# Patient Record
Sex: Female | Born: 1945 | Race: White | Hispanic: No | Marital: Married | Smoking: Never smoker
Health system: Southern US, Community
[De-identification: ages and names within clinical notes are randomized; demographics above are authoritative.]

## PROBLEM LIST (undated history)

## (undated) DIAGNOSIS — Z973 Presence of spectacles and contact lenses: Secondary | ICD-10-CM

## (undated) DIAGNOSIS — E039 Hypothyroidism, unspecified: Secondary | ICD-10-CM

## (undated) DIAGNOSIS — E785 Hyperlipidemia, unspecified: Secondary | ICD-10-CM

## (undated) DIAGNOSIS — M791 Myalgia, unspecified site: Secondary | ICD-10-CM

## (undated) DIAGNOSIS — N200 Calculus of kidney: Secondary | ICD-10-CM

## (undated) DIAGNOSIS — Z9889 Other specified postprocedural states: Secondary | ICD-10-CM

## (undated) DIAGNOSIS — S2691XA Contusion of heart, unspecified with or without hemopericardium, initial encounter: Secondary | ICD-10-CM

## (undated) DIAGNOSIS — R748 Abnormal levels of other serum enzymes: Secondary | ICD-10-CM

## (undated) DIAGNOSIS — H269 Unspecified cataract: Secondary | ICD-10-CM

## (undated) DIAGNOSIS — K5733 Diverticulitis of large intestine without perforation or abscess with bleeding: Secondary | ICD-10-CM

## (undated) DIAGNOSIS — Z853 Personal history of malignant neoplasm of breast: Secondary | ICD-10-CM

## (undated) DIAGNOSIS — J45909 Unspecified asthma, uncomplicated: Secondary | ICD-10-CM

## (undated) DIAGNOSIS — R112 Nausea with vomiting, unspecified: Secondary | ICD-10-CM

## (undated) DIAGNOSIS — K5730 Diverticulosis of large intestine without perforation or abscess without bleeding: Secondary | ICD-10-CM

## (undated) DIAGNOSIS — Z8619 Personal history of other infectious and parasitic diseases: Secondary | ICD-10-CM

## (undated) DIAGNOSIS — Z8719 Personal history of other diseases of the digestive system: Secondary | ICD-10-CM

## (undated) DIAGNOSIS — Z8709 Personal history of other diseases of the respiratory system: Secondary | ICD-10-CM

## (undated) DIAGNOSIS — I2510 Atherosclerotic heart disease of native coronary artery without angina pectoris: Secondary | ICD-10-CM

## (undated) DIAGNOSIS — C801 Malignant (primary) neoplasm, unspecified: Secondary | ICD-10-CM

## (undated) DIAGNOSIS — Z974 Presence of external hearing-aid: Secondary | ICD-10-CM

## (undated) DIAGNOSIS — N189 Chronic kidney disease, unspecified: Secondary | ICD-10-CM

## (undated) DIAGNOSIS — Z87442 Personal history of urinary calculi: Secondary | ICD-10-CM

## (undated) DIAGNOSIS — I219 Acute myocardial infarction, unspecified: Secondary | ICD-10-CM

## (undated) DIAGNOSIS — M8580 Other specified disorders of bone density and structure, unspecified site: Secondary | ICD-10-CM

## (undated) DIAGNOSIS — J302 Other seasonal allergic rhinitis: Secondary | ICD-10-CM

## (undated) DIAGNOSIS — I951 Orthostatic hypotension: Secondary | ICD-10-CM

## (undated) HISTORY — DX: Unspecified cataract: H26.9

## (undated) HISTORY — DX: Other specified disorders of bone density and structure, unspecified site: M85.80

## (undated) HISTORY — DX: Unspecified asthma, uncomplicated: J45.909

## (undated) HISTORY — PX: BREAST SURGERY: SHX581

## (undated) HISTORY — PX: COLON SURGERY: SHX602

## (undated) HISTORY — DX: Diverticulitis of large intestine without perforation or abscess with bleeding: K57.33

## (undated) HISTORY — PX: CARDIAC CATHETERIZATION: SHX172

## (undated) HISTORY — PX: CATARACT EXTRACTION: SUR2

## (undated) HISTORY — PX: COLONOSCOPY: SHX174

## (undated) HISTORY — PX: TONSILLECTOMY: SUR1361

## (undated) HISTORY — DX: Malignant (primary) neoplasm, unspecified: C80.1

## (undated) HISTORY — PX: CARDIOVASCULAR STRESS TEST: SHX262

## (undated) HISTORY — DX: Acute myocardial infarction, unspecified: I21.9

## (undated) HISTORY — DX: Calculus of kidney: N20.0

## (undated) HISTORY — PX: APPENDECTOMY: SHX54

## (undated) HISTORY — PX: LUMBAR FUSION: SHX111

## (undated) HISTORY — PX: OTHER SURGICAL HISTORY: SHX169

---
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
## 1987-01-25 HISTORY — PX: ARTHROSCOPIC REPAIR ACL: SUR80

## 1997-01-24 HISTORY — PX: OTHER SURGICAL HISTORY: SHX169

## 1997-05-28 ENCOUNTER — Encounter: Admission: RE | Admit: 1997-05-28 | Discharge: 1997-08-26 | Payer: Self-pay | Admitting: Radiation Oncology

## 1997-08-07 ENCOUNTER — Other Ambulatory Visit: Admission: RE | Admit: 1997-08-07 | Discharge: 1997-08-07 | Payer: Self-pay | Admitting: Hematology and Oncology

## 1998-01-09 ENCOUNTER — Other Ambulatory Visit: Admission: RE | Admit: 1998-01-09 | Discharge: 1998-01-09 | Payer: Self-pay | Admitting: *Deleted

## 1998-10-01 ENCOUNTER — Other Ambulatory Visit: Admission: RE | Admit: 1998-10-01 | Discharge: 1998-10-01 | Payer: Self-pay | Admitting: General Surgery

## 1999-01-06 ENCOUNTER — Ambulatory Visit (HOSPITAL_COMMUNITY): Admission: RE | Admit: 1999-01-06 | Discharge: 1999-01-06 | Payer: Self-pay | Admitting: Internal Medicine

## 2000-01-25 HISTORY — PX: MASTECTOMY: SHX3

## 2000-03-10 ENCOUNTER — Other Ambulatory Visit: Admission: RE | Admit: 2000-03-10 | Discharge: 2000-03-10 | Payer: Self-pay | Admitting: *Deleted

## 2000-04-20 ENCOUNTER — Encounter (INDEPENDENT_AMBULATORY_CARE_PROVIDER_SITE_OTHER): Payer: Self-pay | Admitting: Specialist

## 2000-04-20 ENCOUNTER — Observation Stay (HOSPITAL_COMMUNITY): Admission: RE | Admit: 2000-04-20 | Discharge: 2000-04-21 | Payer: Self-pay | Admitting: Surgery

## 2001-05-02 ENCOUNTER — Other Ambulatory Visit: Admission: RE | Admit: 2001-05-02 | Discharge: 2001-05-02 | Payer: Self-pay | Admitting: *Deleted

## 2001-08-22 ENCOUNTER — Ambulatory Visit: Admission: RE | Admit: 2001-08-22 | Discharge: 2001-08-22 | Payer: Self-pay | Admitting: Gynecology

## 2002-05-20 ENCOUNTER — Other Ambulatory Visit: Admission: RE | Admit: 2002-05-20 | Discharge: 2002-05-20 | Payer: Self-pay | Admitting: *Deleted

## 2002-12-25 ENCOUNTER — Ambulatory Visit: Admission: RE | Admit: 2002-12-25 | Discharge: 2002-12-25 | Payer: Self-pay | Admitting: Gynecology

## 2003-01-07 ENCOUNTER — Ambulatory Visit (HOSPITAL_COMMUNITY): Admission: RE | Admit: 2003-01-07 | Discharge: 2003-01-07 | Payer: Self-pay | Admitting: Gynecology

## 2003-01-07 ENCOUNTER — Encounter (INDEPENDENT_AMBULATORY_CARE_PROVIDER_SITE_OTHER): Payer: Self-pay

## 2003-01-07 HISTORY — PX: LAPAROSCOPIC BILATERAL SALPINGO OOPHERECTOMY: SHX5890

## 2003-05-06 ENCOUNTER — Observation Stay (HOSPITAL_COMMUNITY): Admission: EM | Admit: 2003-05-06 | Discharge: 2003-05-07 | Payer: Self-pay | Admitting: Emergency Medicine

## 2003-06-27 ENCOUNTER — Other Ambulatory Visit: Admission: RE | Admit: 2003-06-27 | Discharge: 2003-06-27 | Payer: Self-pay | Admitting: *Deleted

## 2004-05-24 ENCOUNTER — Ambulatory Visit: Payer: Self-pay | Admitting: Oncology

## 2004-10-01 ENCOUNTER — Other Ambulatory Visit: Admission: RE | Admit: 2004-10-01 | Discharge: 2004-10-01 | Payer: Self-pay | Admitting: *Deleted

## 2005-01-26 ENCOUNTER — Encounter (INDEPENDENT_AMBULATORY_CARE_PROVIDER_SITE_OTHER): Payer: Self-pay | Admitting: Specialist

## 2005-01-26 ENCOUNTER — Encounter: Admission: RE | Admit: 2005-01-26 | Discharge: 2005-01-26 | Payer: Self-pay | Admitting: Surgery

## 2005-06-16 ENCOUNTER — Ambulatory Visit: Payer: Self-pay | Admitting: Oncology

## 2005-07-29 ENCOUNTER — Ambulatory Visit: Payer: Self-pay | Admitting: Oncology

## 2006-08-03 ENCOUNTER — Ambulatory Visit: Payer: Self-pay | Admitting: Oncology

## 2006-08-25 HISTORY — PX: OTHER SURGICAL HISTORY: SHX169

## 2007-06-05 ENCOUNTER — Ambulatory Visit: Payer: Self-pay | Admitting: Oncology

## 2007-08-13 ENCOUNTER — Ambulatory Visit: Payer: Self-pay | Admitting: Oncology

## 2008-02-12 ENCOUNTER — Ambulatory Visit: Payer: Self-pay | Admitting: Oncology

## 2008-11-19 ENCOUNTER — Encounter (INDEPENDENT_AMBULATORY_CARE_PROVIDER_SITE_OTHER): Payer: Self-pay | Admitting: *Deleted

## 2008-12-30 ENCOUNTER — Encounter: Admission: RE | Admit: 2008-12-30 | Discharge: 2008-12-30 | Payer: Self-pay | Admitting: Endocrinology

## 2009-05-25 ENCOUNTER — Telehealth: Payer: Self-pay | Admitting: Internal Medicine

## 2009-08-21 ENCOUNTER — Encounter (INDEPENDENT_AMBULATORY_CARE_PROVIDER_SITE_OTHER): Payer: Self-pay | Admitting: Surgery

## 2009-08-21 ENCOUNTER — Inpatient Hospital Stay (HOSPITAL_COMMUNITY): Admission: EM | Admit: 2009-08-21 | Discharge: 2009-08-28 | Payer: Self-pay | Admitting: Emergency Medicine

## 2009-08-21 HISTORY — PX: OTHER SURGICAL HISTORY: SHX169

## 2009-11-24 ENCOUNTER — Encounter (INDEPENDENT_AMBULATORY_CARE_PROVIDER_SITE_OTHER): Payer: Self-pay | Admitting: Surgery

## 2009-11-24 ENCOUNTER — Inpatient Hospital Stay (HOSPITAL_COMMUNITY): Admission: RE | Admit: 2009-11-24 | Discharge: 2009-11-28 | Payer: Self-pay | Admitting: Surgery

## 2009-11-24 HISTORY — PX: COLOSTOMY TAKEDOWN: SHX5783

## 2010-02-23 NOTE — Letter (Signed)
Summary: Colonoscopy Letter  [facility]  [address] [place], [place] [address]   Phone: [phone]  Fax: [phone]      November 19, 2008 MRN: [id number]   Crystal Lloyd [address] [place], [place]  [address]   Dear Ms. Layton,   According to your medical record, it is time for you to schedule a Colonoscopy. The American Cancer Society recommends this procedure as a method to detect early colon cancer. Patients with a family history of colon cancer, or a personal history of colon polyps or inflammatory bowel disease are at increased risk.  This letter has beeen generated based on the recommendations made at the time of your procedure. If you feel that in your particular situation this may no longer apply, please contact our office.  Please call our office at [phone] to schedule this appointment or to update your records at your earliest convenience.  Thank you for cooperating with [place] to provide you with the very best care possible.   Sincerely,  Hedwig Morton. Juanda Chance, M.D.  [facility] [phone]

## 2010-02-23 NOTE — Progress Notes (Signed)
Summary: Schedule Colonoscopy  Phone Note Outgoing Call Call back at [facility] Phone [phone]   Call placed by: Harlow Mares CMA Duncan Dull),  May 25, 2009 2:56 PM Call placed to: Patient Summary of Call: Left message on patients machine to call back. patient is due for a colonoscopy due to pos family history of colon cancer. Initial call taken by: Harlow Mares CMA Duncan Dull),  May 25, 2009 2:57 PM  Follow-up for Phone Call        Left message on patients machine to call back. patient due for colonoscopy we will mail her a letter to remind her. Follow-up by: Harlow Mares CMA Duncan Dull),  Jun 05, 2009 12:03 PM

## 2010-04-07 LAB — URINALYSIS, ROUTINE W REFLEX MICROSCOPIC
Bilirubin Urine: NEGATIVE
Glucose, UA: NEGATIVE mg/dL
Hgb urine dipstick: NEGATIVE
Ketones, ur: NEGATIVE mg/dL
Nitrite: NEGATIVE
Protein, ur: NEGATIVE mg/dL
Specific Gravity, Urine: 1.02 (ref 1.005–1.030)
Urobilinogen, UA: 1 mg/dL (ref 0.0–1.0)
pH: 7 (ref 5.0–8.0)

## 2010-04-07 LAB — DIFFERENTIAL
Basophils Absolute: 0 10*3/uL (ref 0.0–0.1)
Basophils Relative: 1 % (ref 0–1)
Eosinophils Absolute: 0.1 10*3/uL (ref 0.0–0.7)
Eosinophils Relative: 3 % (ref 0–5)
Lymphocytes Relative: 29 % (ref 12–46)
Lymphs Abs: 1.4 10*3/uL (ref 0.7–4.0)
Monocytes Absolute: 0.5 10*3/uL (ref 0.1–1.0)
Monocytes Relative: 11 % (ref 3–12)
Neutro Abs: 2.8 10*3/uL (ref 1.7–7.7)
Neutrophils Relative %: 57 % (ref 43–77)

## 2010-04-07 LAB — PROTIME-INR
INR: 1.01 (ref 0.00–1.49)
Prothrombin Time: 13.5 seconds (ref 11.6–15.2)

## 2010-04-07 LAB — CBC
HCT: 40.3 % (ref 36.0–46.0)
Hemoglobin: 13.8 g/dL (ref 12.0–15.0)
MCH: 29.9 pg (ref 26.0–34.0)
MCHC: 34.3 g/dL (ref 30.0–36.0)
MCV: 87.2 fL (ref 78.0–100.0)
Platelets: 220 10*3/uL (ref 150–400)
RBC: 4.63 MIL/uL (ref 3.87–5.11)
RDW: 13.6 % (ref 11.5–15.5)
WBC: 4.9 10*3/uL (ref 4.0–10.5)

## 2010-04-07 LAB — BASIC METABOLIC PANEL
BUN: 12 mg/dL (ref 6–23)
CO2: 28 mEq/L (ref 19–32)
Calcium: 9.8 mg/dL (ref 8.4–10.5)
Chloride: 106 mEq/L (ref 96–112)
Creatinine, Ser: 0.72 mg/dL (ref 0.4–1.2)
GFR calc Af Amer: >60 mL/min (ref >60)
GFR calc non Af Amer: >60 mL/min (ref >60)
Glucose, Bld: 95 mg/dL (ref 70–99)
Potassium: 3.8 mEq/L (ref 3.5–5.1)
Sodium: 141 mEq/L (ref 135–145)

## 2010-04-07 LAB — SURGICAL PCR SCREEN
MRSA, PCR: NEGATIVE
Staphylococcus aureus: NEGATIVE

## 2010-04-09 LAB — URINALYSIS, MICROSCOPIC ONLY
Bilirubin Urine: NEGATIVE
Glucose, UA: NEGATIVE mg/dL
Hgb urine dipstick: NEGATIVE
Ketones, ur: NEGATIVE mg/dL
Leukocytes, UA: NEGATIVE
Nitrite: NEGATIVE
Protein, ur: NEGATIVE mg/dL
Specific Gravity, Urine: 1.009 (ref 1.005–1.030)
Urobilinogen, UA: 1 mg/dL (ref 0.0–1.0)
pH: 7.5 (ref 5.0–8.0)

## 2010-04-10 LAB — BASIC METABOLIC PANEL
BUN: 5 mg/dL — ABNORMAL LOW (ref 6–23)
BUN: 6 mg/dL (ref 6–23)
CO2: 25 mEq/L (ref 19–32)
CO2: 28 mEq/L (ref 19–32)
Calcium: 7.6 mg/dL — ABNORMAL LOW (ref 8.4–10.5)
Calcium: 8.4 mg/dL (ref 8.4–10.5)
Chloride: 104 mEq/L (ref 96–112)
Chloride: 107 mEq/L (ref 96–112)
Creatinine, Ser: 0.7 mg/dL (ref 0.4–1.2)
Creatinine, Ser: 0.71 mg/dL (ref 0.4–1.2)
GFR calc Af Amer: >60 mL/min (ref >60)
GFR calc Af Amer: >60 mL/min (ref >60)
GFR calc non Af Amer: >60 mL/min (ref >60)
GFR calc non Af Amer: >60 mL/min (ref >60)
Glucose, Bld: 139 mg/dL — ABNORMAL HIGH (ref 70–99)
Glucose, Bld: 181 mg/dL — ABNORMAL HIGH (ref 70–99)
Potassium: 3.8 mEq/L (ref 3.5–5.1)
Potassium: 4.1 mEq/L (ref 3.5–5.1)
Sodium: 138 mEq/L (ref 135–145)
Sodium: 140 mEq/L (ref 135–145)

## 2010-04-10 LAB — POCT I-STAT, CHEM 8
BUN: 13 mg/dL (ref 6–23)
Calcium, Ion: 1.2 mmol/L (ref 1.12–1.32)
Chloride: 105 mEq/L (ref 96–112)
Creatinine, Ser: 0.9 mg/dL (ref 0.4–1.2)
Glucose, Bld: 126 mg/dL — ABNORMAL HIGH (ref 70–99)
HCT: 43 % (ref 36.0–46.0)
Hemoglobin: 14.6 g/dL (ref 12.0–15.0)
Potassium: 3.7 mEq/L (ref 3.5–5.1)
Sodium: 140 mEq/L (ref 135–145)
TCO2: 28 mmol/L (ref 0–100)

## 2010-04-10 LAB — URINE MICROSCOPIC-ADD ON

## 2010-04-10 LAB — COMPREHENSIVE METABOLIC PANEL
ALT: 32 U/L (ref 0–35)
AST: 29 U/L (ref 0–37)
Albumin: 4.3 g/dL (ref 3.5–5.2)
Alkaline Phosphatase: 49 U/L (ref 39–117)
BUN: 13 mg/dL (ref 6–23)
CO2: 27 mEq/L (ref 19–32)
Calcium: 9.7 mg/dL (ref 8.4–10.5)
Chloride: 107 mEq/L (ref 96–112)
Creatinine, Ser: 0.69 mg/dL (ref 0.4–1.2)
GFR calc Af Amer: >60 mL/min (ref >60)
GFR calc non Af Amer: >60 mL/min (ref >60)
Glucose, Bld: 129 mg/dL — ABNORMAL HIGH (ref 70–99)
Potassium: 3.6 mEq/L (ref 3.5–5.1)
Sodium: 140 mEq/L (ref 135–145)
Total Bilirubin: 0.9 mg/dL (ref 0.3–1.2)
Total Protein: 7 g/dL (ref 6.0–8.3)

## 2010-04-10 LAB — URINALYSIS, ROUTINE W REFLEX MICROSCOPIC
Bilirubin Urine: NEGATIVE
Glucose, UA: NEGATIVE mg/dL
Ketones, ur: NEGATIVE mg/dL
Leukocytes, UA: NEGATIVE
Nitrite: NEGATIVE
Protein, ur: NEGATIVE mg/dL
Specific Gravity, Urine: 1.015 (ref 1.005–1.030)
Urobilinogen, UA: 0.2 mg/dL (ref 0.0–1.0)
pH: 5.5 (ref 5.0–8.0)

## 2010-04-10 LAB — POCT CARDIAC MARKERS
CKMB, poc: 5.9 ng/mL (ref 1.0–8.0)
Myoglobin, poc: 84.6 ng/mL (ref 12–200)
Troponin i, poc: <0.05 ng/mL (ref 0.00–0.09)

## 2010-04-10 LAB — URINE CULTURE
Colony Count: NO GROWTH
Culture: NO GROWTH

## 2010-04-10 LAB — DIFFERENTIAL
Basophils Absolute: 0 10*3/uL (ref 0.0–0.1)
Basophils Relative: 0 % (ref 0–1)
Eosinophils Absolute: 0.1 10*3/uL (ref 0.0–0.7)
Eosinophils Relative: 2 % (ref 0–5)
Lymphocytes Relative: 26 % (ref 12–46)
Lymphs Abs: 2.2 10*3/uL (ref 0.7–4.0)
Monocytes Absolute: 0.6 10*3/uL (ref 0.1–1.0)
Monocytes Relative: 8 % (ref 3–12)
Neutro Abs: 5.4 10*3/uL (ref 1.7–7.7)
Neutrophils Relative %: 65 % (ref 43–77)

## 2010-04-10 LAB — LACTIC ACID, PLASMA: Lactic Acid, Venous: 2.8 mmol/L — ABNORMAL HIGH (ref 0.5–2.2)

## 2010-04-10 LAB — CBC
HCT: 35.9 % — ABNORMAL LOW (ref 36.0–46.0)
HCT: 39.7 % (ref 36.0–46.0)
Hemoglobin: 12.2 g/dL (ref 12.0–15.0)
Hemoglobin: 13.6 g/dL (ref 12.0–15.0)
MCH: 30.1 pg (ref 26.0–34.0)
MCH: 30.3 pg (ref 26.0–34.0)
MCHC: 34 g/dL (ref 30.0–36.0)
MCHC: 34.4 g/dL (ref 30.0–36.0)
MCV: 88.3 fL (ref 78.0–100.0)
MCV: 88.3 fL (ref 78.0–100.0)
Platelets: 189 10*3/uL (ref 150–400)
Platelets: 229 10*3/uL (ref 150–400)
RBC: 4.06 MIL/uL (ref 3.87–5.11)
RBC: 4.5 MIL/uL (ref 3.87–5.11)
RDW: 13.2 % (ref 11.5–15.5)
RDW: 13.3 % (ref 11.5–15.5)
WBC: 7.9 10*3/uL (ref 4.0–10.5)
WBC: 8.4 10*3/uL (ref 4.0–10.5)

## 2010-04-10 LAB — LIPASE, BLOOD: Lipase: 33 U/L (ref 11–59)

## 2010-05-07 ENCOUNTER — Ambulatory Visit
Admission: RE | Admit: 2010-05-07 | Discharge: 2010-05-07 | Disposition: A | Discharge status: Home / Self Care | Payer: BLUE CROSS/BLUE SHIELD | Source: Ambulatory Visit | Attending: Surgery | Admitting: Surgery

## 2010-05-07 ENCOUNTER — Other Ambulatory Visit: Payer: Self-pay | Admitting: Surgery

## 2010-05-07 DIAGNOSIS — R52 Pain, unspecified: Secondary | ICD-10-CM

## 2010-05-07 MED ORDER — IOHEXOL 300 MG/ML  SOLN
100.0000 mL | Freq: Once | INTRAMUSCULAR | Status: AC | PRN
  Administered 2010-05-07: 100 mL via INTRAVENOUS

## 2010-06-11 NOTE — Discharge Summary (Signed)
NAMESHERIAN, VALENZA                     ACCOUNT NO.:  [id number]   MEDICAL RECORD NO.:  [id number]                   PATIENT TYPE:  INP   LOCATION:  2028                                 FACILITY:  [facility]   PHYSICIAN:  Maple Mirza, P.A.              DATE OF BIRTH:  Apr 02, 1945   DATE OF ADMISSION:  05/06/2003  DATE OF DISCHARGE:  05/07/2003                                 DISCHARGE SUMMARY   DISCHARGE DIAGNOSES:  1. Admitted with substernal chest pain which traveled through to the back     associated with nausea and mild diaphoresis.  2. Troponin I study negative.  3. Electrocardiogram on admission nondiagnostic.  4. Left heart catheterization May 06, 2003, negative for coronary artery     disease.  The coronaries are normal.  Left ventricular ejection fraction     normal.  Dr.  Sharee Holster, interventionalist.   SECONDARY DIAGNOSES:  1. Recent stress Cardiolite study, February 03, 2003, done to follow up on     electrocardiogram abnormalities.  The impression was a probable negative     stress Cardiolite study revealing good exercise tolerance and a     borderline ischemic electrocardiogram response with absence of angina.  2. History of breast cancer, status post chemotherapy and radiation therapy,     status post bilateral mastectomies.  3. Dyslipidemia with increased triglycerides.  4. History of scarlet fever.  5. History of chickenpox.  6. Status post appendectomy.  7. Status post tonsillectomy.  8. Status post oophorectomy.  9. Status post laparoscopic cholecystectomy.   PROCEDURE:  On May 06, 2003, left heart catheterization study shows the  coronary arteries are free of disease.  They are normal.  The left  ventricular ejection fraction is normal.  Dr.  Randa Evens is the  interventionalist.   DISCHARGE DISPOSITION:  Ms.  Brynda Rim is ready for discharge.  Post-procedure  day number one, after undergoing left heart catheterization, her  catheterization  site is benign.  She has good perfusion to the right lower  extremity.  She has not had any persistent chest pain this hospitalization.  Her Vytorin has been adjusted, and she has been started on Lopressor but  this has been discontinued secondary to finding of normal coronary artery  anatomy.   DISCHARGE MEDICATIONS:  Patient will go home on the following medications:  1. Enteric-coated aspirin 81 mg daily.  2. Vytorin 10/40 daily at bedtime.  Patient believes that she currently     takes 10/20.  I will write her a prescription for 10/40, as Dr.  Corinda Gubler     has recommended.  3. Synthroid 75 mcg daily.  4. Femara 2.5 mg daily.  5. Nitroglycerin 0.4 mg 1 tab under the tongue every five minutes times     three doses as needed for chest pain.   PAIN MANAGEMENT:  To include Tylenol 325 mg 1-2 tabs q. 4-6h as needed.   ACTIVITY:  As  far as activity goes, she is asked not to engage in any heavy  lifting or straining for one week.  She was asked not to drive until Friday,  April 15.   DISCHARGE DIET:  A low-sodium, low-cholesterol diet.   Ms. Kuhlmann may shower.  She is to call about her heart care if she  experiences any swelling or increased pain at the catheterization site.  She  has an office visit with Dr.  Tenny Craw on Thursday, May 29, 2003 at 11:15 in the  morning.  She is asked to call  [facility] in the morning of  Wednesday, May 4 for blood work __________BMEQ, which will be reviewed  during the office visit with Dr.  Tenny Craw.   BRIEF HISTORY AND PHYSICAL:  Ms.  Rake is a 65 year old female with  recurrent substernal chest pain which radiates to the back and is associated  with nausea and mild diaphoresis.  The patient was experiencing symptoms of  this chest discomfort at the time of her office visit on April 12 at 1:30,  and they seem to have started about 20-30 minutes prior to the visit.  She  took two adult aspirin with some relief.  She had two prior episodes.   Patient has had two prior episodes of similar pain about three months and  one month ago.  She was seen by Dr.  Dietrich Pates on December 17 and underwent  subsequent exercise Cardiolite study in early January, as dictated above.  The study showed no concerning evidence of ischemia on Cardiolite study.   HOSPITAL COURSE:  Ms.  Pitz was admitted to the telemetry unit.  She was  placed on IV heparin, IV nitroglycerin, given aspirin and scheduled for left  heart catheterization.  The study, as dictated above, showed that the  coronaries were free of disease, and the patient has done well in the post-  procedure 24 hours.  She has had no cardiac dysrhythmias, no respiratory  compromise.  Her catheterization site is healing nicely.  She has had no  persistent chest pain since her hospitalization, and she goes home with the  medications as dictated above.   LABORATORY STUDIES:  On April 13, complete blood count:  White cells 4.5,  hemoglobin 12.4, hematocrit 36.2 and platelets 213.  Serum electrolytes on  April 13:  Sodium 142, potassium 3.6, chloride 110, carbonate 27, glucose  105, BUN 14, creatinine 0.8.  Her Troponin study I on April 12 at 1458 hours  was less than 0.01.  Her lipid profile April 12:  Cholesterol 195,  triglyceride 197, HDL cholesterol 58, LDL cholesterol 98.  And her liver  function studies on admission April 12:  Alkaline phosphatase 58, SGOT 30,  SGPT 25.  The PT and INR on admission were 13 and INR 1.0.                                                Maple Mirza, P.A.    GM/MEDQ  D:  05/07/2003  T:  05/07/2003  Job:  [id number]   cc:   Pricilla Riffle, M.D.   [name] Mater. Evlyn Kanner, M.D.  [address]  [place]  [place] [address]  Fax: [phone]

## 2010-06-11 NOTE — Cardiovascular Report (Signed)
Crystal Lloyd, Crystal Lloyd                     ACCOUNT NO.:  [id number]   MEDICAL RECORD NO.:  [id number]                   PATIENT TYPE:  INP   LOCATION:  2028                                 FACILITY:  [facility]   PHYSICIAN:  Salvadore Farber, M.D. [facility]         DATE OF BIRTH:  April 29, 1945   DATE OF PROCEDURE:  DATE OF DISCHARGE:  05/07/2003                              CARDIAC CATHETERIZATION   PROCEDURE:  Left-heart catheterization, left ventriculography, coronary  angiography.   INDICATIONS:  Crystal Lloyd is a 65 year old lady without prior history of  cardiovascular disease.  She has had negative exercise testing December of  2004 after suffering chest discomfort.  Over the past several weeks, she has  had multiple episodes of chest discomfort occuring at rest.  The worse of  these was today.  She is referred for cardiac catheterization to exclude  coronary disease as the etiology for her chest discomfort.   PROCEDURAL TECHNIQUE:  Informed consent was obtained.  Under 1% lidocaine  local anesthesia, a 5-French sheath was placed in the right common femoral  artery using the modified Seldinger technique.  Diagnostic angiography and  ventriculography were performed using JL-4, JR-4 and pigtail catheters.  The  patient tolerated the procedure well.  Sheaths were removed, and complete  hemostasis was obtained.  She was then transferred to the holding room in  stable condition.   COMPLICATIONS:  None.   FINDINGS:  1. LV:  129/8/15.  Ejection fraction 70% without regional wall-motion     abnormality.  No aortic stenosis or mitral regurgitation.  2. LEFT MAIN:  Angiographically normal.  3. LAD:  A moderate-sized vessel giving rise to two small diagonals.  It is     angiographically normal.  4. CIRCUMFLEX:  A moderate-sized vessel giving rise to two obtuse marginals.     It is angiographically normal.  5. RCA:  A large, dominant vessel.  It is angiographically normal.   IMPRESSION/RECOMMENDATIONS:  1. Angiographically-normal coronary arteries.  Suspect noncardiac etiology     to her chest discomfort.  2. Normal left ventricular size and systolic function.  3. No aortic stenosis or mitral regurgitation.                                               Salvadore Farber, M.D. [facility]    WED/MEDQ  D:  05/06/2003  T:  05/07/2003  Job:  [id number]   cc:   Cecil Cranker, M.D.

## 2010-06-11 NOTE — Op Note (Signed)
NAME:  Crystal Lloyd, Crystal Lloyd                     ACCOUNT NO.:  [id number]   MEDICAL RECORD NO.:  [id number]                   PATIENT TYPE:  AMB   LOCATION:  DAY                                  FACILITY:  [facility]   PHYSICIAN:  De Blanch, M.D.         DATE OF BIRTH:  1945-11-11   DATE OF PROCEDURE:  01/07/2003  DATE OF DISCHARGE:                                 OPERATIVE REPORT   PREOPERATIVE DIAGNOSIS:  Personal history of breast cancer with high-risk  features.   POSTOPERATIVE DIAGNOSIS:  Personal history of breast cancer with high-risk  features, desiring prophylactic salpingo-oophorectomy to reduce her risk of  ovarian cancer and to improve her breast cancer prognosis.   PROCEDURE:  Laparoscopic bilateral salpingo-oophorectomy.   SURGEON:  De Blanch, M.D.   ASSISTANT:  Telford Nab, R.N.   ANESTHESIA:  General with orotracheal tube.   ESTIMATED BLOOD LOSS:  20 mL.   SURGICAL FINDINGS:  At the time of laparoscopy the uterus, tubes, and  ovaries appeared normal.  The exploration of the peritoneal cavity showed no  abnormalities.  The omentum, stomach, diaphragms, and liver capsule also  appeared normal.   DESCRIPTION OF PROCEDURE:  The patient was brought to the operating room and  after satisfactory attainment of general anesthesia was placed in a modified  lithotomy position in Hollins stirrups and the anterior abdominal wall,  perineum, and vagina were prepped with Betadine.  A Foley catheter was  inserted and a Hulka tenaculum was placed in the uterus.  Open laparoscopic  technique was performed with an incision in the umbilicus, carried down  through the peritoneum.  The peritoneum and fascia were secured with figure-  of-eight sutures of 0 Vicryl.  The Hasson cannula was placed and tied in  place.  The laparoscope was inserted, confirming intraperitoneal placement.  Insufflation with carbon dioxide was then accomplished.  The upper abdomen  and  then the pelvis were explored with the above-noted findings.  A 14 mm  suprapubic cannula was placed under direct visualization.  Peritoneal  washings were obtained from the pelvis.  Two 5 mm ports were placed lateral  to the inferior epigastric vessels, which were easily visualized in this  slender patient.  Using monopolar cautery, the peritoneum overlying the  pelvic sidewall was incised.  A few adhesions from the sigmoid colon to near  the ovary were lysed in order to get better exposure of the ovarian vessels.  The retroperitoneal space was opened and the ureter identified along with  the external iliac vessels.  The infundibulopelvic ligament and ovarian  vessels were then skeletonized and a stapler inserted through the suprapubic  site.  The linear stapler was then fired, dividing the ovarian vessels.  The  peritoneum beneath the fallopian tube was then incised using monopolar  cautery until it was mobilized to the uterus.  Using a second stapler, the  uterine-ovarian anastomosis and fallopian tube were then divided and at the  level of  the uterine cornu with care to bring the stapler as close to the  uterus as possible in order to resect all ovarian tissue.  A similar  procedure was performed on the right side of the pelvis.  All pedicles were  reinspected and found to be hemostatic.  A gauze sponge was then placed in  the pelvis through the suprapubic port and used to absorb any extra blood  and fluid.  All pedicles were again evaluated with the intraperitoneal  pressure dropped down to 8 mmHg.  Hemostasis appeared to be excellent.  The  sponge was removed.  The suprapubic port site was then closed using an Endo-  Stitch needle using 0 Vicryl to close the fascia and peritoneum in the  suprapubic site in order to prevent a hernia.  Likewise, the umbilical site  was closed after the 5 mm ports were removed under direct visualization.  Using 0 Vicryl, the umbilical port site fascia and  peritoneum were closed.  The remainder of the incisions were closed with 3-0 Vicryl in a subcuticular  fashion.  Steri-Strips were applied.  The patient was awakened from  anesthesia.  The Foley catheter and Hulka tenaculum were removed before  completion of anesthesia.  The patient was transferred to the recovery room  in satisfactory condition.  Sponge, needle, and instrument counts correct  x2.                                               De Blanch, M.D.    DC/MEDQ  D:  01/07/2003  T:  01/07/2003  Job:  [id number]   cc:   Valentino Hue. Magrinat, M.D.  [address] [facility]  [place]  [place] [address]  Fax: [phone]   Andres Ege, M.D.  [address]  [place]  [place] [address]  Fax: [phone]   Telford Nab, R.N.  [phone] [address]  [place], [place] [address]

## 2010-06-11 NOTE — Consult Note (Signed)
NAME:  Crystal Lloyd, Crystal Lloyd                     ACCOUNT NO.:  [id number]   MEDICAL RECORD NO.:  [id number]                   PATIENT TYPE:  OUT   LOCATION:  GYN                                  FACILITY:  [facility]   PHYSICIAN:  De Blanch, M.D.         DATE OF BIRTH:  07-13-1945   DATE OF CONSULTATION:  12/25/2002  DATE OF DISCHARGE:                                   CONSULTATION   [age]-old white female seen in consultation at the request of Dr.  Marikay Alar Magrinat and for preoperative discussion, anticipating a prophylactic  bilateral salpingo-oophorectomy.   I initially saw the patient in July of 2003.  Please see my detailed note  from that visit.  Subsequently, the patient has considered at great length  the pros and cons of prophylactic oophorectomy and has consulted with Dr.  Darnelle Catalan as well.  The only change in her medical management at the present  time is that she is no longer taking Evista but is taking Femara.  It is  agreed that the patient will not have a hysterectomy.  She is no longer  using Estring.   The details regarding prophylactic oophorectomy were discussed at length  with the patient and her husband.  We plan a laparoscopic approach.  The  patient is aware of the risks and benefits of laparoscopic surgery.  She  requests that if the surgery cannot be performed laparoscopically due to  technical difficulties or anatomic variation that we proceed under the same  anesthetic with laparotomy.  We also discussed the contingency that should  an ovarian or fallopian tube or primary peritoneal cancer be identified at  the time of surgery we would proceed with complete surgical resection and  staging at that same time.  The patient is aware of the risks of surgery  including hemorrhage, infection, injury to adjacent viscera, thromboembolic  complications, potential for hernias at the laparoscopic trocar sites.  All  of her questions are answered and surgery  is scheduled for January 14, 2003.                                               De Blanch, M.D.    DC/MEDQ  D:  12/25/2002  T:  12/25/2002  Job:  [id number]   cc:   Valentino Hue. Magrinat, M.D.  [address] [facility]  [place]  [place] [address]  Fax: [phone]   Andres Ege, M.D.  [address]  [place]  [place] [address]  Fax: [phone]   Telford Nab, R.N.  [phone] [address]. [address]  [place], [place] [address]

## 2010-07-13 ENCOUNTER — Other Ambulatory Visit: Payer: Self-pay | Admitting: Plastic Surgery

## 2011-05-31 ENCOUNTER — Ambulatory Visit: Payer: BLUE CROSS/BLUE SHIELD | Admitting: Internal Medicine

## 2011-05-31 VITALS — BP 138/79 | HR 92 | Temp 98.1°F | Resp 18 | Ht 59.75 in | Wt 121.8 lb

## 2011-05-31 DIAGNOSIS — J329 Chronic sinusitis, unspecified: Secondary | ICD-10-CM

## 2011-05-31 DIAGNOSIS — Z853 Personal history of malignant neoplasm of breast: Secondary | ICD-10-CM | POA: Insufficient documentation

## 2011-05-31 DIAGNOSIS — E785 Hyperlipidemia, unspecified: Secondary | ICD-10-CM | POA: Insufficient documentation

## 2011-05-31 DIAGNOSIS — J019 Acute sinusitis, unspecified: Secondary | ICD-10-CM

## 2011-05-31 DIAGNOSIS — H9190 Unspecified hearing loss, unspecified ear: Secondary | ICD-10-CM | POA: Insufficient documentation

## 2011-05-31 DIAGNOSIS — E039 Hypothyroidism, unspecified: Secondary | ICD-10-CM | POA: Insufficient documentation

## 2011-05-31 MED ORDER — AMOXICILLIN 875 MG PO TABS: 875.0000 mg | ORAL_TABLET | Freq: Two times a day (BID) | ORAL | Status: AC

## 2011-05-31 MED ORDER — BENZONATATE 100 MG PO CAPS: 100.0000 mg | ORAL_CAPSULE | Freq: Three times a day (TID) | ORAL | Status: AC | PRN

## 2011-05-31 NOTE — Progress Notes (Signed)
  Subjective:    Patient ID: Crystal Lloyd, female    DOB: May 02, 1945, 66 y.o.   MRN: [id number]  HPI10 days ago during jury duty she was exposed to an upper respiratory infection which she caught. As this was resolving 5 days ago she began with nocturnal nasal congestion and cough. For the last 48 hours she has had fatigue, headache in the frontal area worse with bending over, copious nasal drainage, nocturnal cough No fever or night sweats    Review of Systems     Objective:   Physical Exam Vital signs stable TMs clear when hearing aids removed Nares congested with purulent discharge Tender maxillary sinuses to percussion Throat clear No nodes Chest clear       Assessment & Plan:  Problem #1 sinusitis with cough Meds ordered this encounter  Medications  . Continue over-the-counter decongestants as needed      . Watch for any emerging allergy symptoms      .       .       . Amoxicillin (AMOXIL) 875 MG tablet    Sig: Take 1 tablet (875 mg total) by mouth 2 (two) times daily.    Dispense:  20 tablet    Refill:  0  . benzonatate (TESSALON) 100 MG capsule    Sig: Take 1 capsule (100 mg total) by mouth 3 (three) times daily as needed for cough.    Dispense:  20 capsule    Refill:  0   Recheck Sunday if not well

## 2011-06-09 ENCOUNTER — Ambulatory Visit: Payer: BLUE CROSS/BLUE SHIELD

## 2011-06-09 ENCOUNTER — Ambulatory Visit (INDEPENDENT_AMBULATORY_CARE_PROVIDER_SITE_OTHER): Payer: BLUE CROSS/BLUE SHIELD | Admitting: Emergency Medicine

## 2011-06-09 VITALS — BP 100/65 | HR 71 | Temp 98.0°F | Resp 16 | Ht 60.0 in | Wt 121.0 lb

## 2011-06-09 DIAGNOSIS — R059 Cough, unspecified: Secondary | ICD-10-CM

## 2011-06-09 DIAGNOSIS — R05 Cough: Secondary | ICD-10-CM

## 2011-06-09 DIAGNOSIS — J019 Acute sinusitis, unspecified: Secondary | ICD-10-CM

## 2011-06-09 LAB — POCT CBC
Granulocyte percent: 50.5 %G (ref 37–80)
HCT, POC: 45 % (ref 37.7–47.9)
Hemoglobin: 14.7 g/dL (ref 12.2–16.2)
Lymph, poc: 3.3 (ref 0.6–3.4)
MCH, POC: 29.2 pg (ref 27–31.2)
MCHC: 32.7 g/dL (ref 31.8–35.4)
MCV: 89.3 fL (ref 80–97)
MID (cbc): 0.7 (ref 0–0.9)
MPV: 9.8 fL (ref 0–99.8)
POC Granulocyte: 4.1 (ref 2–6.9)
POC LYMPH PERCENT: 40.6 %L (ref 10–50)
POC MID %: 8.9 %M (ref 0–12)
Platelet Count, POC: 285 10*3/uL (ref 142–424)
RBC: 5.04 M/uL (ref 4.04–5.48)
RDW, POC: 13.1 %
WBC: 8.2 10*3/uL (ref 4.6–10.2)

## 2011-06-09 MED ORDER — ALBUTEROL SULFATE (2.5 MG/3ML) 0.083% IN NEBU
2.5000 mg | INHALATION_SOLUTION | Freq: Once | RESPIRATORY_TRACT | Status: AC
  Administered 2011-06-09: 2.5 mg via RESPIRATORY_TRACT

## 2011-06-09 MED ORDER — ALBUTEROL SULFATE HFA 108 (90 BASE) MCG/ACT IN AERS: 2.0000 | INHALATION_SPRAY | RESPIRATORY_TRACT | Status: DC | PRN

## 2011-06-09 MED ORDER — IPRATROPIUM BROMIDE 0.02 % IN SOLN: 0.5000 mg | Freq: Once | RESPIRATORY_TRACT | Status: DC

## 2011-06-09 MED ORDER — BECLOMETHASONE DIPROPIONATE 80 MCG/ACT IN AERS: 2.0000 | INHALATION_SPRAY | Freq: Two times a day (BID) | RESPIRATORY_TRACT | Status: DC

## 2011-06-09 NOTE — Progress Notes (Signed)
  Subjective:    Patient ID: Crystal Lloyd, female    DOB: 1945/09/16, 66 y.o.   MRN: [id number]  HPI patient presents with persistent cough. She has tightness in her chest associated with productive cough at times. She feels extremely fatigued with no energy. She is a nonsmoker. She is a breast cancer survivor. She has been on amoxicillin but continues to feel poorly    Review of Systems     Objective:   Physical Exam HEENT exam reveals TMs to be clear nose normal throat clear. Neck is supple chest exam reveals rales present in the lower lung fields  [facility] reading (PRIMARY) by  Dr.Aracelys Glade chest x-ray has not increased markings on lateral. Waters' view is normal  Results for orders placed in visit on 06/09/11  POCT CBC      Component Value Range   WBC 8.2  4.6 - 10.2 (K/uL)   Lymph, poc 3.3  0.6 - 3.4    POC LYMPH PERCENT 40.6  10 - 50 (%L)   MID (cbc) 0.7  0 - 0.9    POC MID % 8.9  0 - 12 (%M)   POC Granulocyte 4.1  2 - 6.9    Granulocyte percent 50.5  37 - 80 (%G)   RBC 5.04  4.04 - 5.48 (M/uL)   Hemoglobin 14.7  12.2 - 16.2 (g/dL)   HCT, POC 40.9  81.1 - 47.9 (%)   MCV 89.3  80 - 97 (fL)   MCH, POC 29.2  27 - 31.2 (pg)   MCHC 32.7  31.8 - 35.4 (g/dL)   RDW, POC 91.4     Platelet Count, POC 285  142 - 424 (K/uL)   MPV 9.8  0 - 99.8 (fL)   Patient's felt significantly better after her treatment. Her peak flow increased by 60 points after her treatment     Assessment & Plan:  I suspect she has a low-grade pneumonia were and check a CBC chest x-ray and Waters' view of the sinuses. X-rays were good CBC was normal she improved with a nebulizer treatment we'll treat his reactive airways disease with Qvar and albuterol.

## 2011-06-28 ENCOUNTER — Encounter: Payer: Self-pay | Admitting: Internal Medicine

## 2011-10-31 ENCOUNTER — Ambulatory Visit (INDEPENDENT_AMBULATORY_CARE_PROVIDER_SITE_OTHER): Payer: BLUE CROSS/BLUE SHIELD | Admitting: Obstetrics and Gynecology

## 2011-10-31 ENCOUNTER — Encounter: Payer: Self-pay | Admitting: Obstetrics and Gynecology

## 2011-10-31 VITALS — BP 118/68 | Ht 59.75 in | Wt 121.0 lb

## 2011-10-31 DIAGNOSIS — Z853 Personal history of malignant neoplasm of breast: Secondary | ICD-10-CM

## 2011-10-31 DIAGNOSIS — N952 Postmenopausal atrophic vaginitis: Secondary | ICD-10-CM

## 2011-10-31 DIAGNOSIS — Z124 Encounter for screening for malignant neoplasm of cervix: Secondary | ICD-10-CM

## 2011-10-31 DIAGNOSIS — M8580 Other specified disorders of bone density and structure, unspecified site: Secondary | ICD-10-CM | POA: Insufficient documentation

## 2011-10-31 DIAGNOSIS — K6282 Dysplasia of anus: Secondary | ICD-10-CM

## 2011-10-31 NOTE — Progress Notes (Signed)
ANNUAL:  Last Pap: 10/28/2010 WNL: No Regular Periods:no Contraception: Post-menopause  Monthly Breast exam:no mastectomy Tetanus<58yrs:yes Nl.Bladder Function:yes Daily BMs:yes Healthy Diet:yes Calcium:no Mammogram:no mastectomy Date of Mammogram: n/a Exercise:yes Have often Exercise: 3 times weekly Seatbelt: yes Abuse at home: no Stressful work:yes Sigmoid-colonoscopy: 2010 Bone Density: Yes 03/2010 PCP: Dr. Evlyn Kanner Change in PMH: None Change in ZOX:WRUE  Subjective:    Crystal Lloyd is a 66 y.o. female G2P0002 who presents for annual exam.  The patient notes a small bump in her LLQ incision site s/p colostomy takedown.  Painful if pressure from clothing occurs  The following portions of the patient's history were reviewed and updated as appropriate: allergies, current medications, past family history, past medical history, past social history, past surgical history and problem list.  Review of Systems Pertinent items are noted in HPI. Gastrointestinal:No change in bowel habits, no abdominal pain, no rectal bleeding Genitourinary:negative for dysuria, frequency, hematuria, nocturia and urinary incontinence    Objective:     BP 118/68  Ht 4' 11.75" (1.518 m)  Wt 121 lb (54.885 kg)  BMI 23.83 kg/m2  Weight:  Wt Readings from Last 1 Encounters:  10/31/11 121 lb (54.885 kg)     BMI: Body mass index is 23.83 kg/(m^2). General Appearance: Alert, appropriate appearance for age. No acute distress HEENT: Grossly normal Neck / Thyroid: Supple, no masses, nodes or enlargement Lungs: clear to auscultation bilaterally Back: No CVA tenderness Breast Exam: s/p bilateral mastectomy.  No chest wall masses or axillary masses Cardiovascular: Regular rate and rhythm. S1, S2, no murmur Gastrointestinal: Soft, non-tender, no masses or organomegaly Pelvic Exam: External genitalia: normal general appearance Vaginal: atrophic mucosa Cervix: atrophic Adnexa: non  palpable Uterus: normal single, nontender Rectovaginal: normal rectal, no masses Lymphatic Exam: Non-palpable nodes in neck, clavicular, axillary, or inguinal regions Skin: no rash or abnormalities Neurologic: Normal gait and speech, no tremor  Psychiatric: Alert and oriented, appropriate affect.    Urinalysis:Not done    Assessment:    Breast cancer s/p bilateral mastectomy NED  Hx AIN I now NED released from surgeon  Plan:   pap smear with HR HPV.  Repeat in 3 years if both nl GI f/u with Dr. Kinnie Scales return annually or prn Follow-up:  for annual exam   Dierdre Forth MD

## 2011-11-02 LAB — PAP IG AND HPV HIGH-RISK: HPV DNA High Risk: NOT DETECTED

## 2012-05-11 ENCOUNTER — Telehealth: Payer: Self-pay | Admitting: Radiology

## 2012-05-11 DIAGNOSIS — Z7189 Other specified counseling: Secondary | ICD-10-CM

## 2012-05-11 DIAGNOSIS — Z7185 Encounter for immunization safety counseling: Secondary | ICD-10-CM

## 2012-05-11 NOTE — Telephone Encounter (Signed)
Dr Cleta Alberts has approved for patient to have varicella and MMR titers done, but I can not get the order to go into the system. Can you please put these in.

## 2012-05-14 NOTE — Telephone Encounter (Signed)
Placed as future orders

## 2012-08-03 ENCOUNTER — Ambulatory Visit (INDEPENDENT_AMBULATORY_CARE_PROVIDER_SITE_OTHER): Payer: BLUE CROSS/BLUE SHIELD

## 2012-08-03 ENCOUNTER — Encounter: Payer: Self-pay | Admitting: Radiology

## 2012-08-03 DIAGNOSIS — Z7189 Other specified counseling: Secondary | ICD-10-CM

## 2012-08-03 DIAGNOSIS — Z7185 Encounter for immunization safety counseling: Secondary | ICD-10-CM

## 2012-08-04 LAB — MEASLES/MUMPS/RUBELLA IMMUNITY
Mumps IgG: 98.4 AU/mL — ABNORMAL HIGH (ref <9.00)
Rubella: 20 Index — ABNORMAL HIGH (ref <0.90)
Rubeola IgG: >300 AU/mL — ABNORMAL HIGH (ref <25.00)

## 2012-08-04 LAB — VARICELLA ZOSTER ANTIBODY, IGG: Varicella IgG: 3277 Index — ABNORMAL HIGH (ref <135.00)

## 2012-08-07 ENCOUNTER — Telehealth: Payer: Self-pay

## 2012-08-07 NOTE — Telephone Encounter (Signed)
Patient is requesting a copy of her lastest lab results for MMR and varicella. She can pick up on Monday of next week.   [phone]

## 2012-08-08 ENCOUNTER — Other Ambulatory Visit: Payer: Self-pay | Admitting: Neurology

## 2012-08-08 DIAGNOSIS — R200 Anesthesia of skin: Secondary | ICD-10-CM

## 2012-08-08 DIAGNOSIS — R2681 Unsteadiness on feet: Secondary | ICD-10-CM

## 2012-08-09 NOTE — Telephone Encounter (Signed)
Labs printed for pick up. In the pick up drawer.

## 2012-08-13 ENCOUNTER — Ambulatory Visit
Admission: RE | Admit: 2012-08-13 | Discharge: 2012-08-13 | Disposition: A | Discharge status: Home / Self Care | Payer: Medicare Other | Source: Ambulatory Visit | Attending: Neurology | Admitting: Neurology

## 2012-08-13 DIAGNOSIS — R2681 Unsteadiness on feet: Secondary | ICD-10-CM

## 2012-08-13 DIAGNOSIS — R200 Anesthesia of skin: Secondary | ICD-10-CM

## 2012-08-13 MED ORDER — GADOBENATE DIMEGLUMINE 529 MG/ML IV SOLN
10.0000 mL | Freq: Once | INTRAVENOUS | Status: AC | PRN
  Administered 2012-08-13: 10 mL via INTRAVENOUS

## 2012-10-09 ENCOUNTER — Ambulatory Visit (INDEPENDENT_AMBULATORY_CARE_PROVIDER_SITE_OTHER): Payer: BLUE CROSS/BLUE SHIELD | Admitting: Family Medicine

## 2012-10-09 VITALS — BP 98/60 | HR 112 | Temp 99.8°F | Resp 18 | Ht 60.0 in | Wt 121.0 lb

## 2012-10-09 DIAGNOSIS — R509 Fever, unspecified: Secondary | ICD-10-CM

## 2012-10-09 DIAGNOSIS — R05 Cough: Secondary | ICD-10-CM

## 2012-10-09 DIAGNOSIS — R059 Cough, unspecified: Secondary | ICD-10-CM

## 2012-10-09 LAB — POCT CBC
Granulocyte percent: 82.7 %G — AB (ref 37–80)
HCT, POC: 44.5 % (ref 37.7–47.9)
Hemoglobin: 14 g/dL (ref 12.2–16.2)
Lymph, poc: 1.1 (ref 0.6–3.4)
MCH, POC: 29.9 pg (ref 27–31.2)
MCHC: 31.5 g/dL — AB (ref 31.8–35.4)
MCV: 95.1 fL (ref 80–97)
MID (cbc): 0.6 (ref 0–0.9)
MPV: 9.8 fL (ref 0–99.8)
POC Granulocyte: 8.3 — AB (ref 2–6.9)
POC LYMPH PERCENT: 11.4 %L (ref 10–50)
POC MID %: 5.9 %M (ref 0–12)
Platelet Count, POC: 200 10*3/uL (ref 142–424)
RBC: 4.68 M/uL (ref 4.04–5.48)
RDW, POC: 13.8 %
WBC: 10 10*3/uL (ref 4.6–10.2)

## 2012-10-09 LAB — POCT RAPID STREP A (OFFICE): Rapid Strep A Screen: NEGATIVE

## 2012-10-09 LAB — POCT INFLUENZA A/B
Influenza A, POC: NEGATIVE
Influenza B, POC: NEGATIVE

## 2012-10-09 MED ORDER — OSELTAMIVIR PHOSPHATE 75 MG PO CAPS: 75.0000 mg | ORAL_CAPSULE | Freq: Two times a day (BID) | ORAL | Status: DC

## 2012-10-09 NOTE — Progress Notes (Signed)
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  She denies nausea vomiting or diarrhea, abdominal pain, or intractable headache.  Objective: Alert woman with hoarseness and injected conjunctiva Nose: Mucopurulent discharge TMs: Normal Oropharynx: Diffuse erythema without significant swelling in the posterior pharynx Neck: Supple no adenopathy Chest: Very congested cough, no rales or rhonchi Heart: About 100 beats per minute, no murmur, gallop or rub  Results for orders placed in visit on 10/09/12  POCT CBC      Result Value Range   WBC 10.0  4.6 - 10.2 K/uL   Lymph, poc 1.1  0.6 - 3.4   POC LYMPH PERCENT 11.4  10 - 50 %L   MID (cbc) 0.6  0 - 0.9   POC MID % 5.9  0 - 12 %M   POC Granulocyte 8.3 (*) 2 - 6.9   Granulocyte percent 82.7 (*) 37 - 80 %G   RBC 4.68  4.04 - 5.48 M/uL   Hemoglobin 14.0  12.2 - 16.2 g/dL   HCT, POC 40.9  81.1 - 47.9 %   MCV 95.1  80 - 97 fL   MCH, POC 29.9  27 - 31.2 pg   MCHC 31.5 (*) 31.8 - 35.4 g/dL   RDW, POC 91.4     Platelet Count, POC 200  142 - 424 K/uL   MPV 9.8  0 - 99.8 fL  POCT RAPID STREP A (OFFICE)      Result Value Range   Rapid Strep A Screen Negative  Negative  POCT INFLUENZA A/B      Result Value Range   Influenza A, POC Negative     Influenza B, POC Negative     Assessment: This has all the characteristics of influenza. For that reason that the patient should get a 10 the night

## 2012-10-12 LAB — CULTURE, GROUP A STREP: Organism ID, Bacteria: NORMAL

## 2012-10-18 ENCOUNTER — Other Ambulatory Visit: Payer: Self-pay | Admitting: Neurology

## 2012-10-18 DIAGNOSIS — Z853 Personal history of malignant neoplasm of breast: Secondary | ICD-10-CM

## 2012-10-18 DIAGNOSIS — R200 Anesthesia of skin: Secondary | ICD-10-CM

## 2012-10-27 ENCOUNTER — Ambulatory Visit
Admission: RE | Admit: 2012-10-27 | Discharge: 2012-10-27 | Disposition: A | Discharge status: Home / Self Care | Payer: Medicare Other | Source: Ambulatory Visit | Attending: Neurology | Admitting: Neurology

## 2012-10-27 DIAGNOSIS — Z853 Personal history of malignant neoplasm of breast: Secondary | ICD-10-CM

## 2012-10-27 DIAGNOSIS — R200 Anesthesia of skin: Secondary | ICD-10-CM

## 2012-10-27 MED ORDER — GADOBENATE DIMEGLUMINE 529 MG/ML IV SOLN
10.0000 mL | Freq: Once | INTRAVENOUS | Status: AC | PRN
  Administered 2012-10-27: 10 mL via INTRAVENOUS

## 2012-12-18 ENCOUNTER — Ambulatory Visit (INDEPENDENT_AMBULATORY_CARE_PROVIDER_SITE_OTHER): Payer: BLUE CROSS/BLUE SHIELD | Admitting: Family Medicine

## 2012-12-18 VITALS — BP 112/78 | HR 88 | Temp 98.7°F | Resp 16 | Ht 60.0 in | Wt 118.0 lb

## 2012-12-18 DIAGNOSIS — J069 Acute upper respiratory infection, unspecified: Secondary | ICD-10-CM

## 2012-12-18 MED ORDER — ALBUTEROL SULFATE HFA 108 (90 BASE) MCG/ACT IN AERS: 2.0000 | INHALATION_SPRAY | RESPIRATORY_TRACT | Status: DC | PRN

## 2012-12-18 MED ORDER — AZITHROMYCIN 250 MG PO TABS: ORAL_TABLET | ORAL | Status: DC

## 2012-12-18 NOTE — Progress Notes (Signed)
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  Patient has had similar symptoms in the past which have progressed to bronchitis because of her reactive airways disease tendency. She no longer has any albuterol inhaler.  Objective: No acute distress but eyes are mildly injected TMs normal Oropharynx: Normal with coated tongue Neck: Supple no adenopathy or thyromegaly Chest: Clear to auscultation  Assessment: 9 days of upper is 3 symptoms with history of reactive airways disease.  Plan:URI, acute - Plan: albuterol (PROVENTIL HFA;VENTOLIN HFA) 108 (90 BASE) MCG/ACT inhaler, azithromycin (ZITHROMAX Z-PAK) 250 MG tablet  Signed, Elvina Sidle, MD

## 2013-01-25 HISTORY — PX: TRANSTHORACIC ECHOCARDIOGRAM: SHX275

## 2013-07-16 ENCOUNTER — Encounter: Payer: Self-pay | Admitting: Internal Medicine

## 2013-07-16 ENCOUNTER — Encounter: Payer: Self-pay | Admitting: Cardiology

## 2013-07-24 HISTORY — PX: BACK SURGERY: SHX140

## 2013-08-13 DIAGNOSIS — G4700 Insomnia, unspecified: Secondary | ICD-10-CM | POA: Insufficient documentation

## 2013-08-13 DIAGNOSIS — M48061 Spinal stenosis, lumbar region without neurogenic claudication: Secondary | ICD-10-CM | POA: Insufficient documentation

## 2013-08-22 HISTORY — PX: LUMBAR FUSION: SHX111

## 2013-09-20 ENCOUNTER — Ambulatory Visit
Admission: RE | Admit: 2013-09-20 | Discharge: 2013-09-20 | Disposition: A | Discharge status: Home / Self Care | Payer: Medicare Other | Source: Ambulatory Visit | Attending: Neurosurgery | Admitting: Neurosurgery

## 2013-09-20 ENCOUNTER — Other Ambulatory Visit: Payer: Self-pay | Admitting: Neurosurgery

## 2013-09-20 DIAGNOSIS — M545 Low back pain, unspecified: Secondary | ICD-10-CM

## 2013-09-25 ENCOUNTER — Ambulatory Visit (INDEPENDENT_AMBULATORY_CARE_PROVIDER_SITE_OTHER): Payer: Medicare Other | Admitting: Physician Assistant

## 2013-09-25 VITALS — BP 102/70 | HR 75 | Temp 98.0°F | Resp 16 | Ht 59.75 in | Wt 119.6 lb

## 2013-09-25 DIAGNOSIS — Z111 Encounter for screening for respiratory tuberculosis: Secondary | ICD-10-CM

## 2013-09-25 DIAGNOSIS — Z23 Encounter for immunization: Secondary | ICD-10-CM

## 2013-09-25 NOTE — Progress Notes (Signed)
  Tuberculosis Risk Questionnaire  1. No Were you born outside the [place] in one of the following parts of the world: [place], [place], [place], [place] or [place]?    2. No Have you traveled outside the [place] and lived for more than one month in one of the following parts of the world: [place], [place], [place], [place] or [place]?    3. No Do you have a compromised immune system such as from any of the following conditions:HIV/AIDS, organ or bone marrow transplantation, diabetes, immunosuppressive medicines (e.g. Prednisone, Remicaide), leukemia, lymphoma, cancer of the head or neck, gastrectomy or jejunal bypass, end-stage renal disease (on dialysis), or silicosis?     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    5. No Have you ever: injected illegal drugs, used crack cocaine, lived in a homeless shelter  or been in jail or prison?     6. No Have you ever been exposed to anyone with infectious tuberculosis?    Tuberculosis Symptom Questionnaire  Do you currently have any of the following symptoms?  1. No Unexplained cough lasting more than 3 weeks?   2. No Unexplained fever lasting more than 3 weeks.   3. No Night Sweats (sweating that leaves the bedclothes and sheets wet)     4. No Shortness of Breath   5. No Chest Pain   6. No Unintentional weight loss    7. No Unexplained fatigue (very tired for no reason)

## 2013-09-27 ENCOUNTER — Ambulatory Visit (INDEPENDENT_AMBULATORY_CARE_PROVIDER_SITE_OTHER): Payer: BLUE CROSS/BLUE SHIELD | Admitting: *Deleted

## 2013-09-27 DIAGNOSIS — Z09 Encounter for follow-up examination after completed treatment for conditions other than malignant neoplasm: Secondary | ICD-10-CM

## 2013-09-27 DIAGNOSIS — Z111 Encounter for screening for respiratory tuberculosis: Secondary | ICD-10-CM

## 2013-09-27 LAB — TB SKIN TEST
Induration: 0 mm
TB Skin Test: NEGATIVE

## 2013-09-27 NOTE — Progress Notes (Signed)
   Subjective:    Patient ID: Crystal Lloyd, female    DOB: 23-Aug-1945, 68 y.o.   MRN: [id number]  HPI Patient here for ppd reading. Negative reading with 48mm duration.   Review of Systems     Objective:   Physical Exam        Assessment & Plan:

## 2013-12-31 ENCOUNTER — Ambulatory Visit (INDEPENDENT_AMBULATORY_CARE_PROVIDER_SITE_OTHER): Payer: Medicare Other

## 2013-12-31 VITALS — BP 105/64 | HR 78 | Resp 12

## 2013-12-31 DIAGNOSIS — M79674 Pain in right toe(s): Secondary | ICD-10-CM

## 2013-12-31 DIAGNOSIS — B351 Tinea unguium: Secondary | ICD-10-CM

## 2013-12-31 DIAGNOSIS — L600 Ingrowing nail: Secondary | ICD-10-CM

## 2013-12-31 MED ORDER — CEPHALEXIN 500 MG PO CAPS: 500.0000 mg | ORAL_CAPSULE | Freq: Three times a day (TID) | ORAL | Status: DC

## 2013-12-31 MED ORDER — TAVABOROLE 5 % EX SOLN: CUTANEOUS | Status: DC

## 2013-12-31 NOTE — Patient Instructions (Signed)
Betadine Soak Instructions  Purchase an 8 oz. bottle of BETADINE solution (Povidone)  THE DAY AFTER THE PROCEDURE  Place 1 tablespoon of betadine solution in a quart of warm tap water.  Submerge your foot or feet with outer bandage intact for the initial soak; this will allow the bandage to become moist and wet for easy lift off.  Once you remove your bandage, continue to soak in the solution for 20 minutes.  This soak should be done twice a day.  Next, remove your foot or feet from solution, blot dry the affected area and cover.  You may use a band aid large enough to cover the area or use gauze and tape.  Apply other medications to the area as directed by the doctor such as cortisporin otic solution (ear drops) or neosporin.  IF YOUR SKIN BECOMES IRRITATED WHILE USING THESE INSTRUCTIONS, IT IS OKAY TO SWITCH TO EPSOM SALTS AND WATER OR WHITE VINEGAR AND WATER.   For postoperative pain recommended Tylenol or Advil or ibuprofen as needed, to Tylenol or 2 Advil 3 times a day, for the first 2 or 3 days should be adequate   For fungus nail treatment the [place] Mon was prescribed and will be delivered directly to your home from the online pharmacy [facility] Apply the topical antifungal medication daily 1 drop to each affected toenail daily for 12 month duration

## 2013-12-31 NOTE — Progress Notes (Signed)
Subjective:    Patient ID: Crystal Lloyd, female    DOB: May 12, 1945, 68 y.o.   MRN: [id number]  HPI PT STATED RT FOOT GREAT TOENAIL HAVE DISCOLORATION FOR 5 MONTHS. THE TOENAIL IS BETTER BECAUSE GOT PEDICURE DONE. THE TOENAIL GET AGGRAVATED BY PRESSURE. TRIED TO USED SILICONE BETWEEN THE TOES AND IT HELPS.   Review of Systems  Constitutional: Positive for activity change.  HENT: Positive for hearing loss.   Eyes: Positive for visual disturbance.  Skin: Positive for color change.  Allergic/Immunologic: Positive for environmental allergies.  All other systems reviewed and are negative.      Objective:   Physical Exam Dr. Isaiah Blakes 68 year old white female well-developed well-nourished oriented 3 presents at this time with a complaint of a painful ingrowing toenail with discoloration and some separation of the nailbed been bothering for more than 5 months she's tried previous self treatment debridement patient nail border shows pain tenderness and incurvation one medial border there is a topical lysis or separation of the nail from the nailbed distal 4/5 of the nail is completely loosened and separated with consistent with onychomycosis the adjacent nails may also have some yellowing and discoloration consistent with onychomycosis as well. No history of acute injury or trauma noted. Next  Lower extremity objective findings as follows vascular status appears to be intact with pedal pulses palpable DP and PT +2 over 4 bilateral Refill time 3 seconds all digits epicritic and proprioceptive sensations appear to be intact and symmetric there is normal plantar response DTRs not listed patient does have a history of cancer and chemotherapy treatments may have had some peripheral neuropathy neuropathy or neuritis in the past. Also is hypothyroid and is on thyroid supplementation and multiple vitamins at this time as well. Medications are reviewed reviewed at this time patient does have orthopedic  biomechanical exam which is unremarkable rectus foot type mild flexible digital contractures are identified does have a mild bunion deformity may be addressed at a future date on an as-needed basis if it were to become symptomatic.       Assessment & Plan:  Assessment this time is right great toenail ingrowing with keratoses incurvation and onychomycosis as well as lysis of nail plate plan at this time recommendation for partial nail excision of the medial border which is incurvated and criptotic ingrowing this time local anesthetic in Mr. total of 3 mL 50-50 mixture of 2% Xylocaine plain and 0.5% Marcaine plain to the right great toe Betadine first performed the medial border is excised followed by alcohol wash sitting cream and gauze dressing being applied. Patient also had lysis of the remaining nail the distal 4/5 of the nail plate are debrided away at this time on this nail should grow back however the cauterize portion lateral of the medial nail border should not grow back was explained to the patient at this time presto compressive dressing is applied patient is given instructions for daily Betadine warm water soak her Epson salt soaks a prescription for cephalexin is given also at this time a prescription for Kerydin topical nail antifungal is issued to the online pharmacy [facility]. We will within next week's initiate templating keratin to the affected nail and nailbed of the right hallux in particular is any adjacent nails which are involved and show bunion fungal thickening and yellowing. Patient will do daily soaks of the band of the hallux with Betadine in her Neosporin and a Band-Aid dressing application daily again recommended Tylenol as needed for  pain or ibuprofen as needed for pain. Recheck in 2-3 weeks for follow-up and nail check.    Harriet Masson DPM

## 2014-01-05 ENCOUNTER — Emergency Department (HOSPITAL_COMMUNITY): Payer: Medicare Other

## 2014-01-05 ENCOUNTER — Ambulatory Visit (INDEPENDENT_AMBULATORY_CARE_PROVIDER_SITE_OTHER): Payer: Medicare Other

## 2014-01-05 ENCOUNTER — Ambulatory Visit (INDEPENDENT_AMBULATORY_CARE_PROVIDER_SITE_OTHER): Payer: Medicare Other | Admitting: Emergency Medicine

## 2014-01-05 ENCOUNTER — Emergency Department (HOSPITAL_COMMUNITY)
Admission: EM | Admit: 2014-01-05 | Discharge: 2014-01-06 | Disposition: A | Discharge status: Home / Self Care | Payer: Medicare Other | Attending: Emergency Medicine | Admitting: Emergency Medicine

## 2014-01-05 ENCOUNTER — Encounter (HOSPITAL_COMMUNITY): Payer: Self-pay

## 2014-01-05 VITALS — BP 114/70 | HR 89 | Temp 98.1°F | Resp 20 | Ht 60.0 in | Wt 116.1 lb

## 2014-01-05 DIAGNOSIS — Z862 Personal history of diseases of the blood and blood-forming organs and certain disorders involving the immune mechanism: Secondary | ICD-10-CM | POA: Diagnosis not present

## 2014-01-05 DIAGNOSIS — R1031 Right lower quadrant pain: Secondary | ICD-10-CM | POA: Diagnosis present

## 2014-01-05 DIAGNOSIS — Z79899 Other long term (current) drug therapy: Secondary | ICD-10-CM | POA: Diagnosis not present

## 2014-01-05 DIAGNOSIS — J45909 Unspecified asthma, uncomplicated: Secondary | ICD-10-CM | POA: Diagnosis not present

## 2014-01-05 DIAGNOSIS — R109 Unspecified abdominal pain: Secondary | ICD-10-CM

## 2014-01-05 DIAGNOSIS — K5790 Diverticulosis of intestine, part unspecified, without perforation or abscess without bleeding: Secondary | ICD-10-CM | POA: Insufficient documentation

## 2014-01-05 DIAGNOSIS — M8580 Other specified disorders of bone density and structure, unspecified site: Secondary | ICD-10-CM | POA: Insufficient documentation

## 2014-01-05 DIAGNOSIS — E079 Disorder of thyroid, unspecified: Secondary | ICD-10-CM | POA: Diagnosis not present

## 2014-01-05 DIAGNOSIS — Z86008 Personal history of in-situ neoplasm of other site: Secondary | ICD-10-CM | POA: Insufficient documentation

## 2014-01-05 DIAGNOSIS — Z8742 Personal history of other diseases of the female genital tract: Secondary | ICD-10-CM | POA: Insufficient documentation

## 2014-01-05 DIAGNOSIS — Z853 Personal history of malignant neoplasm of breast: Secondary | ICD-10-CM | POA: Insufficient documentation

## 2014-01-05 DIAGNOSIS — K5792 Diverticulitis of intestine, part unspecified, without perforation or abscess without bleeding: Secondary | ICD-10-CM

## 2014-01-05 DIAGNOSIS — Z859 Personal history of malignant neoplasm, unspecified: Secondary | ICD-10-CM | POA: Diagnosis not present

## 2014-01-05 DIAGNOSIS — Z792 Long term (current) use of antibiotics: Secondary | ICD-10-CM | POA: Diagnosis not present

## 2014-01-05 DIAGNOSIS — F329 Major depressive disorder, single episode, unspecified: Secondary | ICD-10-CM | POA: Insufficient documentation

## 2014-01-05 LAB — CBC WITH DIFFERENTIAL/PLATELET
Basophils Absolute: 0 10*3/uL (ref 0.0–0.1)
Basophils Relative: 0 % (ref 0–1)
Eosinophils Absolute: 0.2 10*3/uL (ref 0.0–0.7)
Eosinophils Relative: 2 % (ref 0–5)
HCT: 40.5 % (ref 36.0–46.0)
Hemoglobin: 13.1 g/dL (ref 12.0–15.0)
Lymphocytes Relative: 20 % (ref 12–46)
Lymphs Abs: 2 10*3/uL (ref 0.7–4.0)
MCH: 29 pg (ref 26.0–34.0)
MCHC: 32.3 g/dL (ref 30.0–36.0)
MCV: 89.6 fL (ref 78.0–100.0)
Monocytes Absolute: 1 10*3/uL (ref 0.1–1.0)
Monocytes Relative: 10 % (ref 3–12)
Neutro Abs: 6.7 10*3/uL (ref 1.7–7.7)
Neutrophils Relative %: 68 % (ref 43–77)
Platelets: 206 10*3/uL (ref 150–400)
RBC: 4.52 MIL/uL (ref 3.87–5.11)
RDW: 13.4 % (ref 11.5–15.5)
WBC: 9.8 10*3/uL (ref 4.0–10.5)

## 2014-01-05 LAB — POCT CBC
Granulocyte percent: 78 %G (ref 37–80)
HCT, POC: 43 % (ref 37.7–47.9)
Hemoglobin: 14 g/dL (ref 12.2–16.2)
Lymph, poc: 1.6 (ref 0.6–3.4)
MCH, POC: 28.9 pg (ref 27–31.2)
MCHC: 32.7 g/dL (ref 31.8–35.4)
MCV: 88.5 fL (ref 80–97)
MID (cbc): 1 — AB (ref 0–0.9)
MPV: 8.7 fL (ref 0–99.8)
POC Granulocyte: 9.2 — AB (ref 2–6.9)
POC LYMPH PERCENT: 13.8 %L (ref 10–50)
POC MID %: 8.2 %M (ref 0–12)
Platelet Count, POC: 222 10*3/uL (ref 142–424)
RBC: 4.86 M/uL (ref 4.04–5.48)
RDW, POC: 14.3 %
WBC: 11.8 10*3/uL — AB (ref 4.6–10.2)

## 2014-01-05 LAB — POCT UA - MICROSCOPIC ONLY
Bacteria, U Microscopic: 2
Casts, Ur, LPF, POC: NEGATIVE
Crystals, Ur, HPF, POC: NEGATIVE
Mucus, UA: NEGATIVE
WBC, Ur, HPF, POC: NEGATIVE
Yeast, UA: NEGATIVE

## 2014-01-05 LAB — URINALYSIS, ROUTINE W REFLEX MICROSCOPIC
Bilirubin Urine: NEGATIVE
Glucose, UA: NEGATIVE mg/dL
Hgb urine dipstick: NEGATIVE
Ketones, ur: NEGATIVE mg/dL
Leukocytes, UA: NEGATIVE
Nitrite: NEGATIVE
Protein, ur: NEGATIVE mg/dL
Specific Gravity, Urine: 1.013 (ref 1.005–1.030)
Urobilinogen, UA: 0.2 mg/dL (ref 0.0–1.0)
pH: 6 (ref 5.0–8.0)

## 2014-01-05 LAB — COMPREHENSIVE METABOLIC PANEL
ALT: 37 U/L — ABNORMAL HIGH (ref 0–35)
AST: 31 U/L (ref 0–37)
Albumin: 4.1 g/dL (ref 3.5–5.2)
Alkaline Phosphatase: 69 U/L (ref 39–117)
Anion gap: 13 (ref 5–15)
BUN: 10 mg/dL (ref 6–23)
CO2: 26 mEq/L (ref 19–32)
Calcium: 10.5 mg/dL (ref 8.4–10.5)
Chloride: 103 mEq/L (ref 96–112)
Creatinine, Ser: 0.59 mg/dL (ref 0.50–1.10)
GFR calc Af Amer: >90 mL/min (ref >90)
GFR calc non Af Amer: >90 mL/min (ref >90)
Glucose, Bld: 112 mg/dL — ABNORMAL HIGH (ref 70–99)
Potassium: 4.2 mEq/L (ref 3.7–5.3)
Sodium: 142 mEq/L (ref 137–147)
Total Bilirubin: 0.9 mg/dL (ref 0.3–1.2)
Total Protein: 7.9 g/dL (ref 6.0–8.3)

## 2014-01-05 LAB — POCT URINALYSIS DIPSTICK
Bilirubin, UA: NEGATIVE
Glucose, UA: NEGATIVE
Ketones, UA: NEGATIVE
Leukocytes, UA: NEGATIVE
Nitrite, UA: NEGATIVE
Protein, UA: NEGATIVE
Spec Grav, UA: 1.02
Urobilinogen, UA: 0.2
pH, UA: 5.5

## 2014-01-05 LAB — I-STAT CG4 LACTIC ACID, ED: Lactic Acid, Venous: 1.26 mmol/L (ref 0.5–2.2)

## 2014-01-05 MED ORDER — IOHEXOL 300 MG/ML  SOLN
100.0000 mL | Freq: Once | INTRAMUSCULAR | Status: AC | PRN
  Administered 2014-01-05: 100 mL via INTRAVENOUS

## 2014-01-05 MED ORDER — SODIUM CHLORIDE 0.9 % IV BOLUS (SEPSIS)
1000.0000 mL | Freq: Once | INTRAVENOUS | Status: AC
  Administered 2014-01-05: 1000 mL via INTRAVENOUS

## 2014-01-05 MED ORDER — METRONIDAZOLE 500 MG PO TABS
500.0000 mg | ORAL_TABLET | Freq: Once | ORAL | Status: AC
  Administered 2014-01-05: 500 mg via ORAL
  Filled 2014-01-05: qty 1

## 2014-01-05 MED ORDER — CIPROFLOXACIN HCL 500 MG PO TABS: 500.0000 mg | ORAL_TABLET | Freq: Once | ORAL | Status: DC

## 2014-01-05 MED ORDER — METRONIDAZOLE IN NACL 5-0.79 MG/ML-% IV SOLN: 500.0000 mg | Freq: Once | INTRAVENOUS | Status: DC

## 2014-01-05 MED ORDER — METRONIDAZOLE 500 MG PO TABS: 500.0000 mg | ORAL_TABLET | Freq: Once | ORAL | Status: DC

## 2014-01-05 MED ORDER — IOHEXOL 300 MG/ML  SOLN
50.0000 mL | Freq: Once | INTRAMUSCULAR | Status: AC | PRN
  Administered 2014-01-05: 50 mL via ORAL

## 2014-01-05 MED ORDER — CIPROFLOXACIN HCL 500 MG PO TABS
500.0000 mg | ORAL_TABLET | Freq: Once | ORAL | Status: AC
  Administered 2014-01-05: 500 mg via ORAL
  Filled 2014-01-05: qty 1

## 2014-01-05 MED ORDER — CIPROFLOXACIN IN D5W 400 MG/200ML IV SOLN: 400.0000 mg | Freq: Once | INTRAVENOUS | Status: DC

## 2014-01-05 MED ORDER — METRONIDAZOLE 500 MG PO TABS: 500.0000 mg | ORAL_TABLET | Freq: Three times a day (TID) | ORAL | Status: DC

## 2014-01-05 MED ORDER — CIPROFLOXACIN HCL 500 MG PO TABS: 500.0000 mg | ORAL_TABLET | Freq: Two times a day (BID) | ORAL | Status: DC

## 2014-01-05 NOTE — ED Notes (Signed)
Bed: [facility] Expected date:  Expected time:  Means of arrival:  Comments: 

## 2014-01-05 NOTE — Discharge Instructions (Signed)
Please call your doctor for a followup appointment within 24-48 hours. When you talk to your doctor please let them know that you were seen in the emergency department and have them acquire all of your records so that they can discuss the findings with you and formulate a treatment plan to fully care for your new and ongoing problems. Please call and set-up an appointment with your primary care provider Please call and set-up an appointment with your gastroenterologist Please rest and stay hydrated Please avoid fatty, greasy foods Please stick with a clear liquid diet for the next 3-4 days  Please continue to monitor symptoms closely and if symptoms are to worsen or change (fever greater than 101, chills, sweating, nausea, vomiting, chest pain, shortness of breathe, difficulty breathing, weakness, numbness, tingling, worsening or changes to pain pattern, blood in the stools, black tarry stools, neck pain, neck stiffness, back pain, inability to keep food or fluids down) please report back to the Emergency Department immediately.   Diverticulitis Diverticulitis is inflammation or infection of small pouches in your colon that form when you have a condition called diverticulosis. The pouches in your colon are called diverticula. Your colon, or large intestine, is where water is absorbed and stool is formed. Complications of diverticulitis can include:  Bleeding.  Severe infection.  Severe pain.  Perforation of your colon.  Obstruction of your colon. CAUSES  Diverticulitis is caused by bacteria. Diverticulitis happens when stool becomes trapped in diverticula. This allows bacteria to grow in the diverticula, which can lead to inflammation and infection. RISK FACTORS People with diverticulosis are at risk for diverticulitis. Eating a diet that does not include enough fiber from fruits and vegetables may make diverticulitis more likely to develop. SYMPTOMS  Symptoms of diverticulitis may  include:  Abdominal pain and tenderness. The pain is normally located on the left side of the abdomen, but may occur in other areas.  Fever and chills.  Bloating.  Cramping.  Nausea.  Vomiting.  Constipation.  Diarrhea.  Blood in your stool. DIAGNOSIS  Your health care provider will ask you about your medical history and do a physical exam. You may need to have tests done because many medical conditions can cause the same symptoms as diverticulitis. Tests may include:  Blood tests.  Urine tests.  Imaging tests of the abdomen, including X-rays and CT scans. When your condition is under control, your health care provider may recommend that you have a colonoscopy. A colonoscopy can show how severe your diverticula are and whether something else is causing your symptoms. TREATMENT  Most cases of diverticulitis are mild and can be treated at home. Treatment may include:  Taking over-the-counter pain medicines.  Following a clear liquid diet.  Taking antibiotic medicines by mouth for 7-10 days. More severe cases may be treated at a hospital. Treatment may include:  Not eating or drinking.  Taking prescription pain medicine.  Receiving antibiotic medicines through an IV tube.  Receiving fluids and nutrition through an IV tube.  Surgery. HOME CARE INSTRUCTIONS   Follow your health care provider's instructions carefully.  Follow a full liquid diet or other diet as directed by your health care provider. After your symptoms improve, your health care provider may tell you to change your diet. He or she may recommend you eat a high-fiber diet. Fruits and vegetables are good sources of fiber. Fiber makes it easier to pass stool.  Take fiber supplements or probiotics as directed by your health care provider.  Only  take medicines as directed by your health care provider.  Keep all your follow-up appointments. SEEK MEDICAL CARE IF:   Your pain does not improve.  You have  a hard time eating food.  Your bowel movements do not return to normal. SEEK IMMEDIATE MEDICAL CARE IF:   Your pain becomes worse.  Your symptoms do not get better.  Your symptoms suddenly get worse.  You have a fever.  You have repeated vomiting.  You have bloody or black, tarry stools. MAKE SURE YOU:   Understand these instructions.  Will watch your condition.  Will get help right away if you are not doing well or get worse. Document Released: 10/20/2004 Document Revised: 01/15/2013 Document Reviewed: 12/05/2012 [facility] Patient Information 2015 [place], [place]. This information is not intended to replace advice given to you by your health care provider. Make sure you discuss any questions you have with your health care provider.  Clear Liquid Diet A clear liquid diet is a short-term diet that is prescribed to provide the necessary fluid and basic energy you need when you can have nothing else. The clear liquid diet consists of liquids or solids that will become liquid at room temperature. You should be able to see through the liquid. There are many reasons that you may be restricted to clear liquids, such as:  When you have a sudden-onset (acute) condition that occurs before or after surgery.  To help your body slowly get adjusted to food again after a long period when you were unable to have food.  Replacement of fluids when you have a diarrheal disease.  When you are going to have certain exams, such as a colonoscopy, in which instruments are inserted inside your body to look at parts of your digestive system. WHAT CAN I HAVE? A clear liquid diet does not provide all the nutrients you need. It is important to choose a variety of the following items to get as many nutrients as possible:  Vegetable juices that do not have pulp.  Fruit juices and fruit drinks that do not have pulp.  Coffee (regular or decaffeinated), tea, or soda at the discretion of your health care  provider.  Clear bouillon, broth, or strained broth-based soups.  High-protein and flavored gelatins.  Sugar or honey.  Ices or frozen ice pops that do not contain milk. If you are not sure whether you can have certain items, you should ask your health care provider. You may also ask your health care provider if there are any other clear liquid options. Document Released: 01/10/2005 Document Revised: 01/15/2013 Document Reviewed: 12/07/2012 [facility] Patient Information 2015 [place], [place]. This information is not intended to replace advice given to you by your health care provider. Make sure you discuss any questions you have with your health care provider.

## 2014-01-05 NOTE — ED Notes (Signed)
She c/o rlq area abd. Pain since yesterday.  She saw Dr. Everlene Farrier, who performed x-ray and blood work (cbc) and recommend she come here for recheck.  She has hx of diverticulitis with colostomy with subsequent revision.

## 2014-01-05 NOTE — Progress Notes (Addendum)
Subjective:  This chart was scribed for Arlyss Queen, MD by Mercy Moore, Medial Scribe. This patient was seen in room 5 and the patient's care was started at 4:36 PM.  Chief Complaint  Patient presents with  . Abdominal Pain    RLQ Pain     Patient ID: Crystal Lloyd, female    DOB: 1945-02-19, 68 y.o.   MRN: [id number]  HPI HPI Comments: Crystal Lloyd is a 68 y.o. female who presents to the [facility] complaining of RLQ abdominal pain onset while out Christmas shopping at 3pm yesterday. Patient reports constant pain since presentation that is alleviated at rest but exacerbated with coughing, moving and ambulation. Patient reports history of diverticulosis in 2011. Patient reports that during the episode she became suddenly diaphoretic and vasovagal while walking from her office to the parking garage. Patient reports subsequent partial colectomy performed. Patient states that her current pain is not in the same location nor is it as severe as it was during that acute episode. Patient reports that she has been participating in physical therapy after a recent back surgery: lumbar fusion. Patient shares that onset of her pain does not correlate with her core straining. Patient reports difficulty sleeping last night due to pain. Patient states that she is moving both he bowels and gas.  Patient had her appendix removed at the age of 58 but she still has her gallbladder.   Patient Active Problem List   Diagnosis Date Noted  . Vaginal atrophy   . Osteopenia   . AIN grade I   . Hearing loss 05/31/2011  . History of breast cancer 05/31/2011  . Hypothyroidism 05/31/2011  . Hyperlipidemia 05/31/2011   Past Medical History  Diagnosis Date  . Vaginal atrophy   . Osteopenia   . AIN grade III   . Cancer     breast  . Intraepithelial neoplasm   . Asthma   . Thyroid disease   . Anemia   . Joint problem   . Depression    Past Surgical History  Procedure  Laterality Date  . Laparoscopic oophorectomy    . Tonsillectomy    . Mastectomy    . Partial colectomy    . Ain resection    . Lateral fusion lumbar spine     Allergies  Allergen Reactions  . Codeine   . Compazine [Prochlorperazine Edisylate]   . Morphine And Related    Prior to Admission medications   Medication Sig Start Date End Date Taking? Authorizing Provider  ALPRAZolam Duanne Moron) 0.5 MG tablet Take 0.5 mg by mouth at bedtime as needed.   Yes Historical Provider, MD  cephALEXin (KEFLEX) 500 MG capsule Take 1 capsule (500 mg total) by mouth 3 (three) times daily. 12/31/13  Yes Richard Blenda Mounts, DPM  Coenzyme Q10 (CO Q 10) 100 MG CAPS Take by mouth.   Yes Historical Provider, MD  ergocalciferol (VITAMIN D2) 50000 UNITS capsule Take 50,000 Units by mouth every 30 (thirty) days.   Yes Historical Provider, MD  levothyroxine (SYNTHROID, LEVOTHROID) 75 MCG tablet Take 75 mcg by mouth daily.   Yes Historical Provider, MD  montelukast (SINGULAIR) 10 MG tablet  12/10/13  Yes Historical Provider, MD  polyethylene glycol (MIRALAX / GLYCOLAX) packet Take 17 g by mouth daily.   Yes Historical Provider, MD  RESTASIS 0.05 % ophthalmic emulsion  12/17/13  Yes Historical Provider, MD  Riboflavin (B-2-400 PO) Take 1 tablet by mouth daily.   Yes Historical Provider,  MD  rosuvastatin (CRESTOR) 10 MG tablet Take 10 mg by mouth 2 (two) times a week.    Yes Historical Provider, MD  Tavaborole (KERYDIN) 5 % SOLN Apply one drop each affected nail once daily for 12 months 12/31/13  Yes Harriet Masson, DPM   History   Social History  . Marital Status: Married    Spouse Name: N/A    Number of Children: N/A  . Years of Education: N/A   Occupational History  . Not on file.   Social History Main Topics  . Smoking status: Never Smoker   . Smokeless tobacco: Never Used  . Alcohol Use: No  . Drug Use: No  . Sexual Activity: Yes    Birth Control/ Protection: Post-menopausal   Other Topics Concern  . Not  on file   Social History Narrative    Review of Systems  Constitutional: Negative for fever and chills.  Gastrointestinal: Positive for abdominal pain.       Objective:   Physical Exam  Nursing note and vitals reviewed.   CONSTITUTIONAL: Well developed/well nourished HEAD: Normocephalic/atraumatic EYES: EOMI/PERRL ENMT: Mucous membranes moist NECK: supple no meningeal signs SPINE/BACK:entire spine nontender CV: S1/S2 noted, no murmurs/rubs/gallops noted LUNGS: Lungs are clear to auscultation bilaterally, no apparent distress ABDOMEN: abdomen is flat with multiple scars; significant tenderness to RLQ and right adnexa  GU:no cva tenderness NEURO: Pt is awake/alert/appropriate, moves all extremitiesx4.  No facial droop.   EXTREMITIES: pulses normal/equal, full ROM SKIN: warm, color normal PSYCH: no abnormalities of mood noted, alert and oriented to situation  Filed Vitals:   01/05/14 1623  BP: 114/70  Pulse: 89  Temp: 98.1 F (36.7 C)  TempSrc: Oral  Resp: 20  Height: 5' (1.524 m)  Weight: 116 lb 2 oz (52.674 kg)  SpO2: 96%   Results for orders placed or performed in visit on 01/05/14  POCT CBC  Result Value Ref Range   WBC 11.8 (A) 4.6 - 10.2 K/uL   Lymph, poc 1.6 0.6 - 3.4   POC LYMPH PERCENT 13.8 10 - 50 %L   MID (cbc) 1.0 (A) 0 - 0.9   POC MID % 8.2 0 - 12 %M   POC Granulocyte 9.2 (A) 2 - 6.9   Granulocyte percent 78.0 37 - 80 %G   RBC 4.86 4.04 - 5.48 M/uL   Hemoglobin 14.0 12.2 - 16.2 g/dL   HCT, POC 43.0 37.7 - 47.9 %   MCV 88.5 80 - 97 fL   MCH, POC 28.9 27 - 31.2 pg   MCHC 32.7 31.8 - 35.4 g/dL   RDW, POC 14.3 %   Platelet Count, POC 222 142 - 424 K/uL   MPV 8.7 0 - 99.8 fL  POCT urinalysis dipstick  Result Value Ref Range   Color, UA dk yellow    Clarity, UA clear    Glucose, UA neg    Bilirubin, UA neg    Ketones, UA neg    Spec Grav, UA 1.020    Blood, UA small    pH, UA 5.5    Protein, UA neg    Urobilinogen, UA 0.2    Nitrite, UA  neg    Leukocytes, UA Negative   POCT UA - Microscopic Only  Result Value Ref Range   WBC, Ur, HPF, POC neg    RBC, urine, microscopic 2-4    Bacteria, U Microscopic 2    Mucus, UA neg    Epithelial cells, urine per micros 0-3  Crystals, Ur, HPF, POC neg    Casts, Ur, LPF, POC neg    Yeast, UA neg    [facility] reading (PRIMARY) by  Dr. Everlene Farrier there is no free air seen there is a moderate amount of stool there is one film with prosthesis in her bra. No evidence of obstruction. There is hardware present from previous back surgery.      Assessment & Plan:  Patient has significant right lower quadrant abdominal pain. Her white count is elevated but she is afebrile. She does have microscopic hematuria. Her area of tenderness is very low in the right lower abdomen. She is sent to the emergency room for evaluation for CT scanning.  I personally performed the services described in this documentation, which was scribed in my presence. The recorded information has been reviewed and is accurate.  I personally performed the services described in this documentation, which was scribed in my presence. The recorded information has been reviewed and is accurate.

## 2014-01-05 NOTE — ED Provider Notes (Signed)
CSN: [id number]     Arrival date & time 01/05/14  1737 History   First MD Initiated Contact with Patient 01/05/14 1906     Chief Complaint  Patient presents with  . Abdominal Pain     (Consider location/radiation/quality/duration/timing/severity/associated sxs/prior Treatment) The history is provided by the patient. No language interpreter was used.  Crystal Lloyd is a 68 year old female with past medical history vaginal atrophy, AIN grade 3, cancer, asthma, thyroid disease, joint pain, depression presenting to the ED with abdominal pain that started approximately 36 hours ago localized to the right lower quadrant described as a dull, aching pain that is constant with a sharp shooting pain with motion. Stated the pain is localized to the right lower quadrant without radiation. Stated that she's been using Tylenol with minimal relief. Reported chills yesterday. Patient reported that when the pain started was a sudden onset while she was shopping yesterday. Stated that she had a laparoscopic bilateral nephrectomy performed 2005, partial colectomy in 2011 secondary to diverticulitis, and lateral fusion of her lumbar spine performed on 08/22/2013. Stated that she was seen and assessed by her primary care provider's at [facility] prior to arriving to the ED-reported that she was recommended to come to the ED for further workup to be performed. Stated that when she woke up this morning she had a dull aching headache secondary to not sleeping well. Denied vomiting, diarrhea, melena, hematochezia, fever, chest pain, shortness of breath, difficulty breathing, blurred vision, sudden loss of vision, neck pain, back pain, dysuria, hematuria, flank pain, pelvic pain, vaginal bleeding, vaginal discharge, vaginal complaints, changes to appetite. PCP Dr. Forde Dandy  Past Medical History  Diagnosis Date  . Vaginal atrophy   . Osteopenia   . AIN grade III   . Cancer     breast  . Intraepithelial neoplasm    . Asthma   . Thyroid disease   . Anemia   . Joint problem   . Depression    Past Surgical History  Procedure Laterality Date  . Laparoscopic oophorectomy    . Tonsillectomy    . Mastectomy    . Partial colectomy    . Ain resection    . Lateral fusion lumbar spine     Family History  Problem Relation Age of Onset  . Asthma Mother   . Emphysema Mother   . Hypertension Mother    History  Substance Use Topics  . Smoking status: Never Smoker   . Smokeless tobacco: Never Used  . Alcohol Use: No   OB History    Gravida Para Term Preterm AB TAB SAB Ectopic Multiple Living   2 2 0 0 0 0 0 0 0 2      Review of Systems  Constitutional: Positive for chills. Negative for fever.  Eyes: Negative for visual disturbance.  Respiratory: Negative for chest tightness and shortness of breath.   Cardiovascular: Negative for chest pain.  Gastrointestinal: Positive for nausea and abdominal pain. Negative for vomiting, diarrhea, constipation, blood in stool and anal bleeding.  Genitourinary: Negative for decreased urine volume, vaginal bleeding, vaginal discharge and vaginal pain.  Musculoskeletal: Negative for back pain and neck pain.  Neurological: Positive for headaches. Negative for dizziness, weakness and numbness.      Allergies  Codeine; Compazine; and Morphine and related  Home Medications   Prior to Admission medications   Medication Sig Start Date End Date Taking? Authorizing Provider  ALPRAZolam Duanne Moron) 0.5 MG tablet Take 0.5 mg by mouth at bedtime as  needed.   Yes Historical Provider, MD  cephALEXin (KEFLEX) 500 MG capsule Take 1 capsule (500 mg total) by mouth 3 (three) times daily. 12/31/13  Yes Richard Blenda Mounts, DPM  Coenzyme Q10 (CO Q 10) 100 MG CAPS Take 300 mg by mouth daily.    Yes Historical Provider, MD  ergocalciferol (VITAMIN D2) 50000 UNITS capsule Take 50,000 Units by mouth once a week. Every Sunday   Yes Historical Provider, MD  levothyroxine (SYNTHROID,  LEVOTHROID) 75 MCG tablet Take 75 mcg by mouth daily.   Yes Historical Provider, MD  montelukast (SINGULAIR) 10 MG tablet Take 10 mg by mouth daily.  12/10/13  Yes Historical Provider, MD  neomycin-bacitracin-polymyxin (NEOSPORIN) ointment Apply 1 application topically every 12 (twelve) hours. To toe as preventative   Yes Historical Provider, MD  Omega-3 Fatty Acids (FISH OIL PO) Take 2 g by mouth 2 (two) times daily.   Yes Historical Provider, MD  polyethylene glycol (MIRALAX / GLYCOLAX) packet Take 17 g by mouth daily.   Yes Historical Provider, MD  RESTASIS 0.05 % ophthalmic emulsion Place 1 drop into both eyes 2 (two) times daily.  12/17/13  Yes Historical Provider, MD  Riboflavin (B-2-400 PO) Take 200 mg by mouth 2 (two) times daily.    Yes Historical Provider, MD  rosuvastatin (CRESTOR) 10 MG tablet Take 10 mg by mouth daily.    Yes Historical Provider, MD  ciprofloxacin (CIPRO) 500 MG tablet Take 1 tablet (500 mg total) by mouth 2 (two) times daily. 01/05/14   Adalee Kathan, PA-C  metroNIDAZOLE (FLAGYL) 500 MG tablet Take 1 tablet (500 mg total) by mouth 3 (three) times daily. 01/05/14   Malcom Selmer, PA-C  Tavaborole (KERYDIN) 5 % SOLN Apply one drop each affected nail once daily for 12 months 12/31/13   Richard Sikora, DPM   BP 130/71 mmHg  Pulse 69  Temp(Src) 97.7 F (36.5 C) (Oral)  Resp 18  SpO2 96% Physical Exam  Constitutional: She is oriented to person, place, and time. She appears well-developed and well-nourished. No distress.  HENT:  Head: Normocephalic and atraumatic.  Mouth/Throat: Oropharynx is clear and moist. No oropharyngeal exudate.  Eyes: Conjunctivae and EOM are normal. Pupils are equal, round, and reactive to light. Right eye exhibits no discharge. Left eye exhibits no discharge.  Neck: Normal range of motion. Neck supple. No tracheal deviation present.  Negative neck stiffness Negative nuchal rigidity Negative cervical lymphadenopathy Negative meningeal  signs  Cardiovascular: Normal rate, regular rhythm and normal heart sounds.  Exam reveals no friction rub.   No murmur heard. Pulses:      Radial pulses are 2+ on the right side, and 2+ on the left side.       Dorsalis pedis pulses are 2+ on the right side, and 2+ on the left side.  Pulmonary/Chest: Effort normal and breath sounds normal. No respiratory distress. She has no wheezes. She has no rales.  Abdominal: Soft. Bowel sounds are normal. She exhibits no distension. There is tenderness. There is no rebound and no guarding.  Negative abdominal distention Bowel sounds normoactive in all 4 quadrants Abdomen soft upon palpation Discomfort upon palpation to the left lower quadrant, suprapubic and right lower quadrant-most discomfort upon palpation to the right lower quadrant Negative rebound Positive guarding Negative peritoneal signs  Musculoskeletal: Normal range of motion.  Lymphadenopathy:    She has no cervical adenopathy.  Neurological: She is alert and oriented to person, place, and time. No cranial nerve deficit. She exhibits normal muscle  tone. Coordination normal.  Skin: Skin is warm and dry. No rash noted. She is not diaphoretic. No erythema.  Psychiatric: She has a normal mood and affect. Her behavior is normal. Thought content normal.  Nursing note and vitals reviewed.   ED Course  Procedures (including critical care time)  Results for orders placed or performed during the hospital encounter of 01/05/14  Comprehensive metabolic panel  Result Value Ref Range   Sodium 142 137 - 147 mEq/L   Potassium 4.2 3.7 - 5.3 mEq/L   Chloride 103 96 - 112 mEq/L   CO2 26 19 - 32 mEq/L   Glucose, Bld 112 (H) 70 - 99 mg/dL   BUN 10 6 - 23 mg/dL   Creatinine, Ser 0.59 0.50 - 1.10 mg/dL   Calcium 10.5 8.4 - 10.5 mg/dL   Total Protein 7.9 6.0 - 8.3 g/dL   Albumin 4.1 3.5 - 5.2 g/dL   AST 31 0 - 37 U/L   ALT 37 (H) 0 - 35 U/L   Alkaline Phosphatase 69 39 - 117 U/L   Total Bilirubin  0.9 0.3 - 1.2 mg/dL   GFR calc non Af Amer >90 >90 mL/min   GFR calc Af Amer >90 >90 mL/min   Anion gap 13 5 - 15  CBC with Differential  Result Value Ref Range   WBC 9.8 4.0 - 10.5 K/uL   RBC 4.52 3.87 - 5.11 MIL/uL   Hemoglobin 13.1 12.0 - 15.0 g/dL   HCT 40.5 36.0 - 46.0 %   MCV 89.6 78.0 - 100.0 fL   MCH 29.0 26.0 - 34.0 pg   MCHC 32.3 30.0 - 36.0 g/dL   RDW 13.4 11.5 - 15.5 %   Platelets 206 150 - 400 K/uL   Neutrophils Relative % 68 43 - 77 %   Neutro Abs 6.7 1.7 - 7.7 K/uL   Lymphocytes Relative 20 12 - 46 %   Lymphs Abs 2.0 0.7 - 4.0 K/uL   Monocytes Relative 10 3 - 12 %   Monocytes Absolute 1.0 0.1 - 1.0 K/uL   Eosinophils Relative 2 0 - 5 %   Eosinophils Absolute 0.2 0.0 - 0.7 K/uL   Basophils Relative 0 0 - 1 %   Basophils Absolute 0.0 0.0 - 0.1 K/uL  Urinalysis, Routine w reflex microscopic  Result Value Ref Range   Color, Urine YELLOW YELLOW   APPearance CLEAR CLEAR   Specific Gravity, Urine 1.013 1.005 - 1.030   pH 6.0 5.0 - 8.0   Glucose, UA NEGATIVE NEGATIVE mg/dL   Hgb urine dipstick NEGATIVE NEGATIVE   Bilirubin Urine NEGATIVE NEGATIVE   Ketones, ur NEGATIVE NEGATIVE mg/dL   Protein, ur NEGATIVE NEGATIVE mg/dL   Urobilinogen, UA 0.2 0.0 - 1.0 mg/dL   Nitrite NEGATIVE NEGATIVE   Leukocytes, UA NEGATIVE NEGATIVE  I-Stat CG4 Lactic Acid, ED  Result Value Ref Range   Lactic Acid, Venous 1.26 0.5 - 2.2 mmol/L    Labs Review Labs Reviewed  COMPREHENSIVE METABOLIC PANEL - Abnormal; Notable for the following:    Glucose, Bld 112 (*)    ALT 37 (*)    All other components within normal limits  CBC WITH DIFFERENTIAL  URINALYSIS, ROUTINE W REFLEX MICROSCOPIC  I-STAT CG4 LACTIC ACID, ED    Imaging Review Ct Abdomen Pelvis W Contrast  01/05/2014   CLINICAL DATA:  Right lower quadrant abdominal pain beginning yesterday. History of diverticulosis. History of partial colectomy. History of appendectomy.  EXAM: CT ABDOMEN AND PELVIS  WITH CONTRAST  TECHNIQUE:  Multidetector CT imaging of the abdomen and pelvis was performed using the standard protocol following bolus administration of intravenous contrast.  CONTRAST:  72mL OMNIPAQUE IOHEXOL 300 MG/ML SOLN, 170mL OMNIPAQUE IOHEXOL 300 MG/ML SOLN  COMPARISON:  05/07/2010  FINDINGS: Minimal dependent atelectasis is present in the lung bases. There is no pleural effusion.  The liver, gallbladder, spleen, adrenal glands, kidneys, and pancreas have an unremarkable enhanced appearance.  There is a small sliding hiatal hernia. Oral contrast is present in multiple loops of nondilated small bowel without evidence of obstruction. Scattered colonic diverticula are noted. There is focal, moderate wall thickening involving the proximal transverse colon with surrounding inflammatory fat stranding, possibly with an inflamed diverticulum in the center of the inflammation. No extraluminal gas or abscess is identified. There is a moderate amount of stool throughout the colon.  Bladder is unremarkable. Uterus is grossly unremarkable. Appendix is not identified consistent with history of appendectomy. No free fluid or enlarged lymph nodes are identified. Mild atherosclerotic aortoiliac calcification is noted. Grade 1 anterolisthesis is again noted of L3 and L4, with interval PLIF at this level.  IMPRESSION: Focal wall thickening and inflammation involving the proximal transverse colon, favored to represent acute diverticulitis. No abscess.   Electronically Signed   By: Logan Bores   On: 01/05/2014 21:03   Dg Abd Acute W/chest  01/05/2014   CLINICAL DATA:  Right lower quadrant abdominal pain.  EXAM: ACUTE ABDOMEN SERIES (ABDOMEN 2 VIEW & CHEST 1 VIEW)  COMPARISON:  August 21, 2009.  FINDINGS: There is no evidence of dilated bowel loops or free intraperitoneal air. Phleboliths are noted in the pelvis. Status post surgical posterior fusion of L3-4. Moderate stool burden is noted. Heart size and mediastinal contours are within normal limits.  Both lungs are clear.  IMPRESSION: No evidence of bowel obstruction or ileus. Moderate stool burden is noted which may represent constipation. No acute cardiopulmonary abnormality seen.   Electronically Signed   By: Sabino Dick M.D.   On: 01/05/2014 17:15     EKG Interpretation None       MDM   Final diagnoses:  Diverticulitis of intestine without perforation or abscess without bleeding    Medications  sodium chloride 0.9 % bolus 1,000 mL (0 mLs Intravenous Stopped 01/05/14 2023)  iohexol (OMNIPAQUE) 300 MG/ML solution 50 mL (50 mLs Oral Contrast Given 01/05/14 1943)  iohexol (OMNIPAQUE) 300 MG/ML solution 100 mL (100 mLs Intravenous Contrast Given 01/05/14 2029)  metroNIDAZOLE (FLAGYL) tablet 500 mg (500 mg Oral Given 01/05/14 2337)  ciprofloxacin (CIPRO) tablet 500 mg (500 mg Oral Given 01/05/14 2337)   This provider reviewed the patient's chart. Patient was seen and assessed by primary care provider's office, Dr. Everlene Farrier recommended patient to come to the ED. Last performed that identified small amount of blood in the urine. Patient had a small elevated leukocytosis of 11.8. CBC unremarkable-negative elevated leukocytosis-white blood cell count 9.8. Hemoglobin 13.1, hematocrit 40.5. CMP noted mildly elevated ALT 37. Glucose 112. Negative elevated anion gap of 13.0 mg/L. Lactic acid negative elevation. Urinalysis negative for nitrites, negative leukocytes or hemoglobin identified-negative findings of urinary tract infection. CT abdomen and pelvis with contrast noted focal wall thickening and inflammation involving the proximal transverse colon, favored to represent acute diverticulitis-no abscess or perforation identified. Patient presenting to the ED with focal diverticulitis as noted on CT abdomen and pelvis with contrast. Negative finding of perforation on abscess. Patient stable, afebrile. Patient not septic appearing. Negative signs of  respiratory distress. Patient tolerated fluids PO  without difficulty - negative episodes of emesis while in the ED setting. Discharged patient. Discharged patient with antibiotics. Discussed with patient to rest and stay hydrated. Discussed with patient proper diet. Discussed with patient to avoid any physical or strenuous activity. Referred to PCP and GI. Discussed with patient to closely monitor symptoms and if symptoms are to worsen or change to report back to the ED - strict return instructions given.  Patient agreed to plan of care, understood, all questions answered.   Jamse Mead, PA-C 01/05/14 2351  Artis Delay, MD 01/06/14 [phone]

## 2014-01-05 NOTE — ED Notes (Signed)
Pt tolerated a cup of water and popsicle  w/o difficulty.

## 2014-01-07 ENCOUNTER — Telehealth: Payer: Self-pay | Admitting: *Deleted

## 2014-01-07 NOTE — Telephone Encounter (Signed)
"  How long should I continue to soak?  Should it be done for a week or two?  He may have told me."  You need to soak as long as there is drainage.  "How would the drainage look?"  It could be clear or have a little tinge to it.  "I haven't noticed anything on the band-aid."  It's probably stopped, you can stop soaking.  "I'll just probably continue to soak it for a day or 2 to be safe.  I don't want infection to set in."  That is fine.

## 2014-01-14 ENCOUNTER — Ambulatory Visit (INDEPENDENT_AMBULATORY_CARE_PROVIDER_SITE_OTHER): Payer: Medicare Other

## 2014-01-14 VITALS — BP 99/64 | HR 66 | Resp 12

## 2014-01-14 DIAGNOSIS — L600 Ingrowing nail: Secondary | ICD-10-CM

## 2014-01-14 DIAGNOSIS — B351 Tinea unguium: Secondary | ICD-10-CM

## 2014-01-14 DIAGNOSIS — Z09 Encounter for follow-up examination after completed treatment for conditions other than malignant neoplasm: Secondary | ICD-10-CM

## 2014-01-14 DIAGNOSIS — M79673 Pain in unspecified foot: Secondary | ICD-10-CM

## 2014-01-14 NOTE — Progress Notes (Signed)
   Subjective:    Patient ID: Crystal Lloyd, female    DOB: July 26, 1945, 68 y.o.   MRN: [id number]  HPI ''RT FOOT GREAT TOENAIL IS STILL A LITTLE SORE AND NOT USING ANTIFUNGAL MED. YET.''   Review of Systems no new findings or systemic changes noted     Objective:   Physical Exam Neurovascular status is intact pedal pulses are palpable patient is status post partial nail excision and phenol matricectomy medial border of the hallux right great toe still some mild edema and erythema with slight discharge drainage no purulence no malodor no increased temperature no signs of infection 3 continue with Neosporin and Band-Aid during the day keep the Band-Aid off at night when it air dry with a week or 2 may start using topical antifungal as prescribed contact is any new problems or difficulties develop at any time if the nails not completely healed following AP nail procedure with the next 3 or 4 weeks follow-up as needed       Assessment & Plan:  Assessment good postop progress discharge to an as-needed basis for future follow-up initiate topical antifungal therapy within next several weeks once AP nail procedure is resolved are healed F Harriet Masson DPM

## 2014-01-14 NOTE — Patient Instructions (Signed)
[facility] Instructions-Post Nail Surgery  You have had your ingrown toenail and root treated with a chemical.  This chemical causes a burn that will drain and ooze like a blister.  This can drain for 6-8 weeks or longer.  It is important to keep this area clean, covered, and follow the soaking instructions dispensed at the time of your surgery.  This area will eventually dry and form a scab.  Once the scab forms you no longer need to soak or apply a dressing.  If at any time you experience an increase in pain, redness, swelling, or drainage, you should contact the office as soon as possible.  Once the nailbed is healed adequately can start using the topical antifungal as prescribed

## 2014-01-24 ENCOUNTER — Emergency Department (HOSPITAL_COMMUNITY): Payer: Medicare Other

## 2014-01-24 ENCOUNTER — Inpatient Hospital Stay (HOSPITAL_COMMUNITY)
Admission: EM | Admit: 2014-01-24 | Discharge: 2014-01-25 | DRG: 072 | Disposition: A | Discharge status: Home / Self Care | Payer: Medicare Other | Attending: Endocrinology | Admitting: Endocrinology

## 2014-01-24 ENCOUNTER — Ambulatory Visit (INDEPENDENT_AMBULATORY_CARE_PROVIDER_SITE_OTHER): Payer: Medicare Other | Admitting: Internal Medicine

## 2014-01-24 ENCOUNTER — Encounter (HOSPITAL_COMMUNITY): Payer: Self-pay | Admitting: Emergency Medicine

## 2014-01-24 VITALS — BP 172/92 | HR 98 | Temp 98.0°F | Resp 18 | Ht 59.0 in | Wt 115.0 lb

## 2014-01-24 DIAGNOSIS — E079 Disorder of thyroid, unspecified: Secondary | ICD-10-CM | POA: Diagnosis present

## 2014-01-24 DIAGNOSIS — E039 Hypothyroidism, unspecified: Secondary | ICD-10-CM | POA: Diagnosis present

## 2014-01-24 DIAGNOSIS — R251 Tremor, unspecified: Secondary | ICD-10-CM

## 2014-01-24 DIAGNOSIS — Z981 Arthrodesis status: Secondary | ICD-10-CM | POA: Diagnosis not present

## 2014-01-24 DIAGNOSIS — Z853 Personal history of malignant neoplasm of breast: Secondary | ICD-10-CM | POA: Diagnosis not present

## 2014-01-24 DIAGNOSIS — J45909 Unspecified asthma, uncomplicated: Secondary | ICD-10-CM | POA: Diagnosis present

## 2014-01-24 DIAGNOSIS — R413 Other amnesia: Secondary | ICD-10-CM

## 2014-01-24 DIAGNOSIS — Z79899 Other long term (current) drug therapy: Secondary | ICD-10-CM | POA: Diagnosis not present

## 2014-01-24 DIAGNOSIS — Z885 Allergy status to narcotic agent status: Secondary | ICD-10-CM | POA: Diagnosis not present

## 2014-01-24 DIAGNOSIS — G459 Transient cerebral ischemic attack, unspecified: Secondary | ICD-10-CM

## 2014-01-24 DIAGNOSIS — R030 Elevated blood-pressure reading, without diagnosis of hypertension: Secondary | ICD-10-CM | POA: Diagnosis present

## 2014-01-24 DIAGNOSIS — E785 Hyperlipidemia, unspecified: Secondary | ICD-10-CM | POA: Diagnosis present

## 2014-01-24 DIAGNOSIS — G454 Transient global amnesia: Principal | ICD-10-CM | POA: Diagnosis present

## 2014-01-24 LAB — URINALYSIS, ROUTINE W REFLEX MICROSCOPIC
Bilirubin Urine: NEGATIVE
Glucose, UA: NEGATIVE mg/dL
Hgb urine dipstick: NEGATIVE
Ketones, ur: NEGATIVE mg/dL
Leukocytes, UA: NEGATIVE
Nitrite: NEGATIVE
Protein, ur: NEGATIVE mg/dL
Specific Gravity, Urine: 1.006 (ref 1.005–1.030)
Urobilinogen, UA: 0.2 mg/dL (ref 0.0–1.0)
pH: 7 (ref 5.0–8.0)

## 2014-01-24 LAB — I-STAT CHEM 8, ED
BUN: 12 mg/dL (ref 6–23)
Calcium, Ion: 1.33 mmol/L — ABNORMAL HIGH (ref 1.13–1.30)
Chloride: 103 mEq/L (ref 96–112)
Creatinine, Ser: 0.6 mg/dL (ref 0.50–1.10)
Glucose, Bld: 112 mg/dL — ABNORMAL HIGH (ref 70–99)
HCT: 45 % (ref 36.0–46.0)
Hemoglobin: 15.3 g/dL — ABNORMAL HIGH (ref 12.0–15.0)
Potassium: 3.6 mmol/L (ref 3.5–5.1)
Sodium: 143 mmol/L (ref 135–145)
TCO2: 25 mmol/L (ref 0–100)

## 2014-01-24 LAB — POCT UA - MICROSCOPIC ONLY
Bacteria, U Microscopic: NEGATIVE
Casts, Ur, LPF, POC: NEGATIVE
Crystals, Ur, HPF, POC: NEGATIVE
Mucus, UA: NEGATIVE
WBC, Ur, HPF, POC: NEGATIVE
Yeast, UA: NEGATIVE

## 2014-01-24 LAB — DIFFERENTIAL
Basophils Absolute: 0 10*3/uL (ref 0.0–0.1)
Basophils Relative: 0 % (ref 0–1)
Eosinophils Absolute: 0.1 10*3/uL (ref 0.0–0.7)
Eosinophils Relative: 2 % (ref 0–5)
Lymphocytes Relative: 21 % (ref 12–46)
Lymphs Abs: 1.5 10*3/uL (ref 0.7–4.0)
Monocytes Absolute: 0.4 10*3/uL (ref 0.1–1.0)
Monocytes Relative: 5 % (ref 3–12)
Neutro Abs: 5.1 10*3/uL (ref 1.7–7.7)
Neutrophils Relative %: 72 % (ref 43–77)

## 2014-01-24 LAB — POCT URINALYSIS DIPSTICK
Bilirubin, UA: NEGATIVE
Glucose, UA: NEGATIVE
Ketones, UA: NEGATIVE
Leukocytes, UA: NEGATIVE
Nitrite, UA: NEGATIVE
Protein, UA: NEGATIVE
Spec Grav, UA: 1.01
Urobilinogen, UA: 0.2
pH, UA: 6.5

## 2014-01-24 LAB — PROTIME-INR
INR: 0.98 (ref 0.00–1.49)
Prothrombin Time: 13.1 seconds (ref 11.6–15.2)

## 2014-01-24 LAB — ETHANOL: Alcohol, Ethyl (B): <5 mg/dL (ref 0–9)

## 2014-01-24 LAB — RAPID URINE DRUG SCREEN, HOSP PERFORMED
Amphetamines: NOT DETECTED
Barbiturates: NOT DETECTED
Benzodiazepines: NOT DETECTED
Cocaine: NOT DETECTED
Opiates: NOT DETECTED
Tetrahydrocannabinol: NOT DETECTED

## 2014-01-24 LAB — CBC
HCT: 42.3 % (ref 36.0–46.0)
Hemoglobin: 14.1 g/dL (ref 12.0–15.0)
MCH: 29.6 pg (ref 26.0–34.0)
MCHC: 33.3 g/dL (ref 30.0–36.0)
MCV: 88.9 fL (ref 78.0–100.0)
Platelets: 233 10*3/uL (ref 150–400)
RBC: 4.76 MIL/uL (ref 3.87–5.11)
RDW: 13.3 % (ref 11.5–15.5)
WBC: 7.1 10*3/uL (ref 4.0–10.5)

## 2014-01-24 LAB — COMPREHENSIVE METABOLIC PANEL
ALT: 53 U/L — ABNORMAL HIGH (ref 0–35)
AST: 43 U/L — ABNORMAL HIGH (ref 0–37)
Albumin: 4.5 g/dL (ref 3.5–5.2)
Alkaline Phosphatase: 61 U/L (ref 39–117)
Anion gap: 10 (ref 5–15)
BUN: 11 mg/dL (ref 6–23)
CO2: 28 mmol/L (ref 19–32)
Calcium: 10.4 mg/dL (ref 8.4–10.5)
Chloride: 103 mEq/L (ref 96–112)
Creatinine, Ser: 0.64 mg/dL (ref 0.50–1.10)
GFR calc Af Amer: >90 mL/min (ref >90)
GFR calc non Af Amer: >90 mL/min (ref >90)
Glucose, Bld: 112 mg/dL — ABNORMAL HIGH (ref 70–99)
Potassium: 3.6 mmol/L (ref 3.5–5.1)
Sodium: 141 mmol/L (ref 135–145)
Total Bilirubin: 0.8 mg/dL (ref 0.3–1.2)
Total Protein: 7.1 g/dL (ref 6.0–8.3)

## 2014-01-24 LAB — APTT: aPTT: 28 seconds (ref 24–37)

## 2014-01-24 LAB — I-STAT TROPONIN, ED: Troponin i, poc: 0.02 ng/mL (ref 0.00–0.08)

## 2014-01-24 MED ORDER — LEVOTHYROXINE SODIUM 50 MCG PO TABS
75.0000 ug | ORAL_TABLET | Freq: Every day | ORAL | Status: DC
  Administered 2014-01-25: 75 ug via ORAL
  Filled 2014-01-24 (×2): qty 1

## 2014-01-24 MED ORDER — ROSUVASTATIN CALCIUM 10 MG PO TABS
10.0000 mg | ORAL_TABLET | Freq: Every day | ORAL | Status: DC
  Administered 2014-01-24: 10 mg via ORAL
  Filled 2014-01-24 (×2): qty 1

## 2014-01-24 MED ORDER — ENOXAPARIN SODIUM 40 MG/0.4ML ~~LOC~~ SOLN
40.0000 mg | Freq: Every day | SUBCUTANEOUS | Status: DC
  Administered 2014-01-24: 40 mg via SUBCUTANEOUS
  Filled 2014-01-24: qty 0.4

## 2014-01-24 MED ORDER — CYCLOSPORINE 0.05 % OP EMUL
1.0000 [drp] | Freq: Two times a day (BID) | OPHTHALMIC | Status: DC
  Administered 2014-01-24 – 2014-01-25 (×2): 1 [drp] via OPHTHALMIC
  Filled 2014-01-24 (×3): qty 1

## 2014-01-24 MED ORDER — ERGOCALCIFEROL 1.25 MG (50000 UT) PO CAPS: 50000.0000 [IU] | ORAL_CAPSULE | ORAL | Status: DC

## 2014-01-24 MED ORDER — ACETAMINOPHEN 325 MG PO TABS: 650.0000 mg | ORAL_TABLET | Freq: Four times a day (QID) | ORAL | Status: DC | PRN

## 2014-01-24 MED ORDER — VITAMIN D (ERGOCALCIFEROL) 1.25 MG (50000 UNIT) PO CAPS
50000.0000 [IU] | ORAL_CAPSULE | ORAL | Status: DC
Start: 2014-01-26 — End: 2014-01-25

## 2014-01-24 MED ORDER — ALPRAZOLAM 0.25 MG PO TABS
0.2500 mg | ORAL_TABLET | Freq: Once | ORAL | Status: AC
  Administered 2014-01-24: 0.25 mg via ORAL

## 2014-01-24 MED ORDER — MONTELUKAST SODIUM 10 MG PO TABS
10.0000 mg | ORAL_TABLET | Freq: Every day | ORAL | Status: DC
  Administered 2014-01-24: 10 mg via ORAL
  Filled 2014-01-24: qty 1

## 2014-01-24 MED ORDER — SODIUM CHLORIDE 0.9 % IJ SOLN
3.0000 mL | Freq: Two times a day (BID) | INTRAMUSCULAR | Status: DC
Start: 2014-01-24 — End: 2014-01-25

## 2014-01-24 MED ORDER — OMEGA-3-ACID ETHYL ESTERS 1 G PO CAPS
1.0000 g | ORAL_CAPSULE | Freq: Two times a day (BID) | ORAL | Status: DC
  Administered 2014-01-24 – 2014-01-25 (×2): 1 g via ORAL
  Filled 2014-01-24 (×2): qty 1

## 2014-01-24 MED ORDER — SODIUM CHLORIDE 0.9 % IV SOLN
INTRAVENOUS | Status: DC
  Administered 2014-01-24: 1000 mL via INTRAVENOUS

## 2014-01-24 MED ORDER — ACETAMINOPHEN 650 MG RE SUPP: 650.0000 mg | Freq: Four times a day (QID) | RECTAL | Status: DC | PRN

## 2014-01-24 MED ORDER — ALPRAZOLAM 0.5 MG PO TABS: 0.5000 mg | ORAL_TABLET | Freq: Every evening | ORAL | Status: DC | PRN

## 2014-01-24 NOTE — ED Provider Notes (Signed)
CSN: [id number]     Arrival date & time 01/24/14  1544 History   First MD Initiated Contact with Patient 01/24/14 1548     Chief Complaint  Patient presents with  . Memory Loss     (Consider location/radiation/quality/duration/timing/severity/associated sxs/prior Treatment) HPI   Crystal Lloyd is a 69 y.o. female presents for evaluation of sudden onset of amnesia.  She was doing well earlier today, took a bike ride, then sitting in her living room with her husband.  She began to be confused about recent events.  Her husband asked her several things to determine the extent of the memory loss.  She could remember her name, but not the date.  She currently his name but not things that had done together in the last several days.  There is no dysarthria, receptive or expressive aphasia, by description.  He was able to walk.  She did not complain of headache.  Her husband took her to a primary care office, and she was transferred here by EMS, from there.  She's never had this previously.  She has somewhat stressful day yesterday regarding some issues at work.  Prior to that in the last week she had been doing well and taken a trip to the beach.  2 weeks ago.  She was treated in the emergency department for diverticulitis.  She received Cipro, and Flagyl for that treatment.  She had resolution of her symptoms, of diverticulitis.  She is taking her usual medications.  There are no other known modifying factors.   Past Medical History  Diagnosis Date  . Vaginal atrophy   . Osteopenia   . AIN grade III   . Cancer     breast  . Intraepithelial neoplasm   . Asthma   . Thyroid disease   . Anemia   . Joint problem   . Depression    Past Surgical History  Procedure Laterality Date  . Laparoscopic oophorectomy    . Tonsillectomy    . Mastectomy    . Partial colectomy    . Ain resection    . Lateral fusion lumbar spine     Family History  Problem Relation Age of Onset  . Asthma Mother    . Emphysema Mother   . Hypertension Mother    History  Substance Use Topics  . Smoking status: Never Smoker   . Smokeless tobacco: Never Used  . Alcohol Use: No   OB History    Gravida Para Term Preterm AB TAB SAB Ectopic Multiple Living   2 2 0 0 0 0 0 0 0 2      Review of Systems  All other systems reviewed and are negative.     Allergies  Codeine; Compazine; and Morphine and related  Home Medications   Prior to Admission medications   Medication Sig Start Date End Date Taking? Authorizing Provider  ALPRAZolam Duanne Moron) 0.5 MG tablet Take 0.5 mg by mouth at bedtime as needed for anxiety.    Yes Historical Provider, MD  Coenzyme Q10 (CO Q 10) 100 MG CAPS Take 300 mg by mouth daily.    Yes Historical Provider, MD  ergocalciferol (VITAMIN D2) 50000 UNITS capsule Take 50,000 Units by mouth once a week. Every Sunday   Yes Historical Provider, MD  levothyroxine (SYNTHROID, LEVOTHROID) 75 MCG tablet Take 75 mcg by mouth daily.   Yes Historical Provider, MD  montelukast (SINGULAIR) 10 MG tablet Take 10 mg by mouth at bedtime.   Yes Historical Provider,  MD  neomycin-bacitracin-polymyxin (NEOSPORIN) ointment Apply 1 application topically every 12 (twelve) hours. To toe as preventative   Yes Historical Provider, MD  Omega-3 Fatty Acids (FISH OIL PO) Take 2 g by mouth 2 (two) times daily.   Yes Historical Provider, MD  polyethylene glycol (MIRALAX / GLYCOLAX) packet Take 17 g by mouth daily.   Yes Historical Provider, MD  RESTASIS 0.05 % ophthalmic emulsion Place 1 drop into both eyes 2 (two) times daily.  12/17/13  Yes Historical Provider, MD  Riboflavin (B-2-400 PO) Take 200 mg by mouth 2 (two) times daily.    Yes Historical Provider, MD  rosuvastatin (CRESTOR) 10 MG tablet Take 10 mg by mouth daily.    Yes Historical Provider, MD  Tavaborole (KERYDIN) 5 % SOLN Apply one drop each affected nail once daily for 12 months 12/31/13  Yes Richard Sikora, DPM   BP 128/76 mmHg  Pulse 72   Temp(Src) 98.3 F (36.8 C) (Oral)  Resp 18  Ht 4\' 11"  (1.499 m)  Wt 114 lb 11.2 oz (52.028 kg)  BMI 23.15 kg/m2  SpO2 98% Physical Exam  Constitutional: She is oriented to person, place, and time. She appears well-developed and well-nourished. She appears distressed (she is anxious.).  HENT:  Head: Normocephalic and atraumatic.  Right Ear: External ear normal.  Left Ear: External ear normal.  Eyes: Conjunctivae and EOM are normal. Pupils are equal, round, and reactive to light.  Neck: Normal range of motion and phonation normal. Neck supple.  Cardiovascular: Normal rate, regular rhythm and normal heart sounds.   Pulmonary/Chest: Effort normal and breath sounds normal. She exhibits no bony tenderness.  Abdominal: Soft. There is no tenderness.  Musculoskeletal: Normal range of motion.  Neurological: She is alert and oriented to person, place, and time. No cranial nerve deficit or sensory deficit. She exhibits normal muscle tone. Coordination normal.  No dysarthria and aphasia or nystagmus.  No ataxia.  Mild memory loss is present.  She does not have global amnesia.  She states that she cannot recall when her husband was asking her questions, while they were at home.  Initially, she could not recall the name of her PCP, but then later was able to.  Skin: Skin is warm, dry and intact.  Psychiatric: She has a normal mood and affect. Her behavior is normal. Judgment and thought content normal.  Nursing note and vitals reviewed.   ED Course  Procedures (including critical care time)  Initial consideration for causative factors and possible concern for code stroke, raises the possibility of thrombolysis.  However, the patient's NIH score is very low, and her only neurologic abnormality is spotty amnesia.  This does not meet criteria for acute thrombolysis.  Consultation with stroke neurologist, and patient is sent to MRI for imaging.  1603- Call placed to Dr. Aram Beecham. Case discussed, he will  see patient.   17:30- .  She is able to recall some events better, and is able to store some memories in improved fashion.  She still has significant repetitive questioning, and deficit events from today, and the last few days.   Medications  montelukast (SINGULAIR) tablet 10 mg (10 mg Oral Given 01/24/14 2318)  omega-3 acid ethyl esters (LOVAZA) capsule 1 g (1 g Oral Given 01/24/14 2318)  cycloSPORINE (RESTASIS) 0.05 % ophthalmic emulsion 1 drop (1 drop Both Eyes Given 01/24/14 2318)  levothyroxine (SYNTHROID, LEVOTHROID) tablet 75 mcg (not administered)  rosuvastatin (CRESTOR) tablet 10 mg (10 mg Oral Given 01/24/14 2318)  ALPRAZolam (  XANAX) tablet 0.5 mg (not administered)  enoxaparin (LOVENOX) injection 40 mg (40 mg Subcutaneous Given 01/24/14 2319)  sodium chloride 0.9 % injection 3 mL (3 mLs Intravenous Not Given 01/24/14 2319)  0.9 %  sodium chloride infusion (1,000 mLs Intravenous New Bag/Given 01/24/14 2234)  acetaminophen (TYLENOL) tablet 650 mg (not administered)    Or  acetaminophen (TYLENOL) suppository 650 mg (not administered)  Vitamin D (Ergocalciferol) (DRISDOL) capsule 50,000 Units (not administered)    Patient Vitals for the past 24 hrs:  BP Temp Temp src Pulse Resp SpO2 Height Weight  01/24/14 2130 128/76 mmHg 98.3 F (36.8 C) Oral 72 18 98 % 4\' 11"  (1.499 m) 114 lb 11.2 oz (52.028 kg)  01/24/14 2057 - 98.1 F (36.7 C) - - - - - -  01/24/14 2030 141/83 mmHg - - 79 18 96 % - -  01/24/14 2000 144/79 mmHg - - 80 17 97 % - -  01/24/14 1830 147/80 mmHg - - 84 20 97 % - -  01/24/14 1556 168/94 mmHg 98.1 F (36.7 C) Oral 90 17 99 % - -  01/24/14 1549 - - - - - 98 % - -       Labs Review Labs Reviewed  COMPREHENSIVE METABOLIC PANEL - Abnormal; Notable for the following:    Glucose, Bld 112 (*)    AST 43 (*)    ALT 53 (*)    All other components within normal limits  I-STAT CHEM 8, ED - Abnormal; Notable for the following:    Glucose, Bld 112 (*)    Calcium, Ion 1.33 (*)     Hemoglobin 15.3 (*)    All other components within normal limits  ETHANOL  PROTIME-INR  APTT  CBC  DIFFERENTIAL  URINE RAPID DRUG SCREEN (HOSP PERFORMED)  URINALYSIS, ROUTINE W REFLEX MICROSCOPIC  HEMOGLOBIN A1C  LIPID PANEL  VITAMIN B12  TROPONIN I  CK  TSH  COMPREHENSIVE METABOLIC PANEL  CBC  I-STAT TROPOININ, ED  Randolm Idol, ED    Imaging Review Mr Jodene Nam Head Wo Contrast  01/24/2014   CLINICAL DATA:  Acute memory dysfunction.  Amnesia.  EXAM: MRA HEAD WITHOUT CONTRAST  TECHNIQUE: Angiographic images of the Circle of Willis were obtained using MRA technique without intravenous contrast.  COMPARISON:  MRI earlier today.  MRI studies 08/13/2012.  FINDINGS: Both internal carotid arteries are widely patent into the brain. The anterior and middle cerebral vessels are normal without proximal stenosis, aneurysm or vascular malformation. Both vertebral arteries are widely patent with the left being dominant. The right vertebral artery supplies PICA, with a small contribution beyond that to the basilar. No basilar stenosis. Posterior circulation branch vessels appear normal.  IMPRESSION: Normal intracranial MR angiography of the large and medium size vessels.   Electronically Signed   By: Nelson Chimes M.D.   On: 01/24/2014 19:38   Mr Brain Wo Contrast  01/24/2014   CLINICAL DATA:  Acute memory loss beginning last night. Temporary amnesia.  EXAM: MRI HEAD WITHOUT CONTRAST  TECHNIQUE: Multiplanar, multiecho pulse sequences of the brain and surrounding structures were obtained without intravenous contrast.  COMPARISON:  08/13/2012  FINDINGS: The brain has a normal appearance on all pulse sequences without evidence of malformation, atrophy, old or acute infarction, mass lesion, hemorrhage, hydrocephalus or extra-axial collection. No pituitary mass. No fluid in the sinuses, middle ears or mastoids. No skull or skullbase lesion. There is flow in the major vessels at the base of the brain. Major  venous sinuses  show flow.  IMPRESSION: No change. Normal MRI of the brain. No cause of the presenting symptoms is identified.   Electronically Signed   By: Nelson Chimes M.D.   On: 01/24/2014 17:34     EKG Interpretation   Date/Time:  Friday January 24 2014 15:52:48 EST Ventricular Rate:  85 PR Interval:  156 QRS Duration: 93 QT Interval:  376 QTC Calculation: 447 R Axis:   29 Text Interpretation:  Sinus rhythm Probable left atrial enlargement  Borderline ST depression, diffuse leads Since last tracing ST depression  in Diffuse leads Confirmed by [facility]  MD, Rafeal Skibicki ([id number]) on 01/24/2014  5:08:08 PM      MDM   Final diagnoses:  Amnesia  Transient cerebral ischemia, unspecified transient cerebral ischemia type    Amnesia, acute onset, with some recognizable improvement during a period of ED observation.  This most likely represents a TIA.  There is no stroke seen on MR imaging.  Nursing Notes Reviewed/ Care Coordinated, and agree without changes. Applicable Imaging Reviewed.  Interpretation of Laboratory Data incorporated into ED treatment  Plan: Admit for further evaluation, and treatment.    Richarda Blade, MD 01/25/14 [phone]

## 2014-01-24 NOTE — Patient Instructions (Signed)
To [facility]/ EMS Stroke protocol

## 2014-01-24 NOTE — Progress Notes (Signed)
Pt arrived on Unit 2120hrs, A&O, no C/O pain, no obvious distress, oriented to unit and room equipment.

## 2014-01-24 NOTE — Consult Note (Signed)
NEURO HOSPITALIST CONSULT NOTE    Reason for Consult: acute memory dysfunction  HPI:                                                                                                                                          Crystal Lloyd is an 68 y.o. female brought to ED due to patient having amnestic like episode. Husband states she was fine during her bike ride this AM but upon returning home around 2 am she couldn't remember certain gifts given to her by friends during [place]. She was not really confuse, " just couldn't remember and was not able to recall today's day". Husband stated that she was able to walk in the house and wne upstair to get dress before being pick up by EMS who transported patient to the ED.  On transport she was alert and oriented but continued to ask where she was. Never had similar episode before. While in the ED patient remains continually asking same questions over and over again. Of note, husband report that she has been under unusual stress at work in the past 24 hours. Patient denies HA, vertigo, double vision, focal weakness or numbness, slurred speech, language or vision impairment. No recent head trauma, fever, infection, or new medications. MRI brain was reviewed by myself and showed no acute intracranial abnormality.  Past Medical History  Diagnosis Date  . Vaginal atrophy   . Osteopenia   . AIN grade III   . Cancer     breast  . Intraepithelial neoplasm   . Asthma   . Thyroid disease   . Anemia   . Joint problem   . Depression     Past Surgical History  Procedure Laterality Date  . Laparoscopic oophorectomy    . Tonsillectomy    . Mastectomy    . Partial colectomy    . Ain resection    . Lateral fusion lumbar spine      Family History  Problem Relation Age of Onset  . Asthma Mother   . Emphysema Mother   . Hypertension Mother     Family history: no brain tumor, demyelinating disease, epilepsy, or  PD Social History:  reports that she has never smoked. She has never used smokeless tobacco. She reports that she does not drink alcohol or use illicit drugs.  Allergies  Allergen Reactions  . Codeine   . Compazine [Prochlorperazine Edisylate]   . Morphine And Related     MEDICATIONS:  No current facility-administered medications for this encounter.   Current Outpatient Prescriptions  Medication Sig Dispense Refill  . ALPRAZolam (XANAX) 0.5 MG tablet Take 0.5 mg by mouth at bedtime as needed.    . Coenzyme Q10 (CO Q 10) 100 MG CAPS Take 300 mg by mouth daily.     . ergocalciferol (VITAMIN D2) 50000 UNITS capsule Take 50,000 Units by mouth once a week. Every Sunday    . levothyroxine (SYNTHROID, LEVOTHROID) 75 MCG tablet Take 75 mcg by mouth daily.    . metroNIDAZOLE (FLAGYL) 500 MG tablet Take 1 tablet (500 mg total) by mouth 3 (three) times daily. 21 tablet 0  . montelukast (SINGULAIR) 10 MG tablet Take 10 mg by mouth daily.   1  . neomycin-bacitracin-polymyxin (NEOSPORIN) ointment Apply 1 application topically every 12 (twelve) hours. To toe as preventative    . Omega-3 Fatty Acids (FISH OIL PO) Take 2 g by mouth 2 (two) times daily.    . polyethylene glycol (MIRALAX / GLYCOLAX) packet Take 17 g by mouth daily.    . RESTASIS 0.05 % ophthalmic emulsion Place 1 drop into both eyes 2 (two) times daily.   1  . Riboflavin (B-2-400 PO) Take 200 mg by mouth 2 (two) times daily.     . rosuvastatin (CRESTOR) 10 MG tablet Take 10 mg by mouth daily.     Corrie Dandy (KERYDIN) 5 % SOLN Apply one drop each affected nail once daily for 12 months 10 mL 3      ROS:                                                                                                                                        History obtained from chart review and husband  General ROS: negative for - chills,  fatigue, fever, night sweats, weight gain or weight loss Psychological ROS: negative for - behavioral disorder, hallucinations, memory difficulties, mood swings or suicidal ideation Ophthalmic ROS: negative for - blurry vision, double vision, eye pain or loss of vision ENT ROS: negative for - epistaxis, nasal discharge, oral lesions, sore throat, tinnitus or vertigo Allergy and Immunology ROS: negative for - hives or itchy/watery eyes Hematological and Lymphatic ROS: negative for - bleeding problems, bruising or swollen lymph nodes Endocrine ROS: negative for - galactorrhea, hair pattern changes, polydipsia/polyuria or temperature intolerance Respiratory ROS: negative for - cough, hemoptysis, shortness of breath or wheezing Cardiovascular ROS: negative for - chest pain, dyspnea on exertion, edema or irregular heartbeat Gastrointestinal ROS: negative for - abdominal pain, diarrhea, hematemesis, nausea/vomiting or stool incontinence Genito-Urinary ROS: negative for - dysuria, hematuria, incontinence or urinary frequency/urgency Musculoskeletal ROS: negative for - joint swelling or muscular weakness Neurological ROS: as noted in HPI Dermatological ROS: negative for rash and skin lesion changes   Physical exam: pleasant [place] in no apparent distress. Blood pressure 168/94, pulse 90, temperature 98.1 F (36.7 C), temperature  source Oral, resp. rate 17, SpO2 99 %. Head: normocephalic. Neck: supple, no bruits, no JVD. Cardiac: no murmurs. Lungs: clear. Abdomen: soft, no tender, no mass. Extremities: no edema. Skin: no rash Neurologic Examination:                                                                                                      Mental Status: Alert, oriented, thought content appropriate.  Speech fluent without evidence of aphasia.  Able to follow 3 step commands without difficulty. Cranial Nerves: II: Discs flat bilaterally; Visual fields grossly normal, pupils equal, round,  reactive to light and accommodation III,IV, VI: ptosis not present, extra-ocular motions intact bilaterally V,VII: smile symmetric, facial light touch sensation normal bilaterally VIII: hearing normal bilaterally IX,X: gag reflex present XI: bilateral shoulder shrug XII: midline tongue extension Motor: Right : Upper extremity   5/5    Left:     Upper extremity   5/5  Lower extremity   5/5     Lower extremity   5/5 Tone and bulk:normal tone throughout; no atrophy noted Sensory: Pinprick and light touch intact throughout, bilaterally Deep Tendon Reflexes: 2+ and symmetric throughout Plantars: Right: downgoing   Left: downgoing Cerebellar: normal finger-to-nose, normal rapid alternating movements and normal heel-to-shin test Gait: normal gait and station      Lab Results: Basic Metabolic Panel:  Recent Labs Lab 01/24/14 1624  NA 143  K 3.6  CL 103  GLUCOSE 112*  BUN 12  CREATININE 0.60    Liver Function Tests: No results for input(s): AST, ALT, ALKPHOS, BILITOT, PROT, ALBUMIN in the last 168 hours. No results for input(s): LIPASE, AMYLASE in the last 168 hours. No results for input(s): AMMONIA in the last 168 hours.  CBC:  Recent Labs Lab 01/24/14 1610 01/24/14 1624  WBC 7.1  --   NEUTROABS 5.1  --   HGB 14.1 15.3*  HCT 42.3 45.0  MCV 88.9  --   PLT 233  --     Cardiac Enzymes: No results for input(s): CKTOTAL, CKMB, CKMBINDEX, TROPONINI in the last 168 hours.  Lipid Panel: No results for input(s): CHOL, TRIG, HDL, CHOLHDL, VLDL, LDLCALC in the last 168 hours.  CBG: No results for input(s): GLUCAP in the last 168 hours.  Microbiology: Results for orders placed or performed in visit on 10/09/12  Culture, Group A Strep     Status: None   Collection Time: 10/09/12  8:57 PM  Result Value Ref Range Status   Organism ID, Bacteria Normal Upper Respiratory Flora  Final   Organism ID, Bacteria No Beta Hemolytic Streptococci Isolated  Final    Coagulation  Studies: No results for input(s): LABPROT, INR in the last 72 hours.  Imaging: No results found.  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 Likely TGA, but differential include TIA and less likely partial seizure. Ordered MRA brain and EEG in order to complete TGA work up    Dorian Pod, MD  [facility] [phone]  01/24/2014,  4:38 PM

## 2014-01-24 NOTE — ED Notes (Signed)
Pt doesn't remember anything after dinner last night.  Symptoms not noticed until 1330.  Pt seems to have temporary amnesia.  EMS reports pt was alert and oriented entire transport (approximately 20 minutes).  Now pt repeatedly asks where she is and is reminded she is at the hospital.  Pt is transfer from [facility].  No deficits.  No headache.  Pt was hypertensive at UC.

## 2014-01-24 NOTE — ED Notes (Signed)
Pt to mri 

## 2014-01-24 NOTE — ED Notes (Signed)
Report attempt x 1 

## 2014-01-24 NOTE — H&P (Signed)
Crystal Lloyd is an 69 y.o. female.   PCP:   Sheela Stack, MD   Chief Complaint:  TIA, Amnesia.  HPI:  68 y.o. female c Hyperlipidemia on Crestor, Prior Breat CA and Hypothyroid on Synthroid brought to UC and eventually the ED for evaluation of sudden onset of amnesia.  Husband states she was fine during her bike ride this AM but upon returning home around 2 pm she couldn't remember certain gifts given to her by friends during [place]. She was not really confused, " just couldn't remember and was not able to recall today's day". Husband recognized that she had memory loss and kept quizzing her.  She was able to walk in the house and upstair to get dress before being picked up by EMS who transported patient to the ED. On transport she was alert and oriented but continued to ask where she was. Never had similar episode before.  Also at [facility] shge was so nervous they gave her a xanax. While in the ED patient remains continually asking same questions over and over again. Patient denies HA, vertigo, double vision, focal weakness or numbness, slurred speech, language or vision impairment. There is no dysarthria, receptive or expressive aphasia and she is able to walk.  No recent head trauma, or new medications. MRI brain showed no acute intracranial abnormality. She has somewhat stressful day yesterday regarding some issues at work. Prior to that in the last week she had been doing well and taken a trip to the beach. 2 weeks ago she was treated in the emergency department for diverticulitis. She received Cipro, and Flagyl for that treatment. No further Sxs of diverticulitis. She is taking her usual medications. There are no other known modifying factors.  She was seen by EDP and Neuro Hospitalists. Neuro stated Likely TGA, but differential include TIA and less likely partial seizure. He Ordered MRA brain and EEG in order to complete TGA work up  I was asked to do her admission.  BP  is High in ED.      Past Medical History:  Past Medical History  Diagnosis Date  . Vaginal atrophy   . Osteopenia   . AIN grade III   . Cancer     breast  . Intraepithelial neoplasm   . Asthma   . Thyroid disease   . Anemia   . Joint problem   . Depression     Past Surgical History  Procedure Laterality Date  . Laparoscopic oophorectomy    . Tonsillectomy    . Mastectomy    . Partial colectomy    . Ain resection    . Lateral fusion lumbar spine        Allergies:   Allergies  Allergen Reactions  . Codeine Other (See Comments)    unknown  . Compazine [Prochlorperazine Edisylate] Nausea Only  . Morphine And Related Nausea Only     Medications: Prior to Admission medications   Medication Sig Start Date End Date Taking? Authorizing Provider  ALPRAZolam Duanne Moron) 0.5 MG tablet Take 0.5 mg by mouth at bedtime as needed for anxiety.    Yes Historical Provider, MD  Coenzyme Q10 (CO Q 10) 100 MG CAPS Take 300 mg by mouth daily.    Yes Historical Provider, MD  ergocalciferol (VITAMIN D2) 50000 UNITS capsule Take 50,000 Units by mouth once a week. Every Sunday   Yes Historical Provider, MD  levothyroxine (SYNTHROID, LEVOTHROID) 75 MCG tablet Take 75 mcg by mouth daily.   Yes  Historical Provider, MD  montelukast (SINGULAIR) 10 MG tablet Take 10 mg by mouth at bedtime.   Yes Historical Provider, MD  neomycin-bacitracin-polymyxin (NEOSPORIN) ointment Apply 1 application topically every 12 (twelve) hours. To toe as preventative   Yes Historical Provider, MD  Omega-3 Fatty Acids (FISH OIL PO) Take 2 g by mouth 2 (two) times daily.   Yes Historical Provider, MD  polyethylene glycol (MIRALAX / GLYCOLAX) packet Take 17 g by mouth daily.   Yes Historical Provider, MD  RESTASIS 0.05 % ophthalmic emulsion Place 1 drop into both eyes 2 (two) times daily.  12/17/13  Yes Historical Provider, MD  Riboflavin (B-2-400 PO) Take 200 mg by mouth 2 (two) times daily.    Yes Historical  Provider, MD  rosuvastatin (CRESTOR) 10 MG tablet Take 10 mg by mouth daily.    Yes Historical Provider, MD  Tavaborole (KERYDIN) 5 % SOLN Apply one drop each affected nail once daily for 12 months 12/31/13  Yes Harriet Masson, DPM      (Not in a hospital admission)   Social History:  reports that she has never smoked. She has never used smokeless tobacco. She reports that she does not drink alcohol or use illicit drugs.  Family History: Family History  Problem Relation Age of Onset  . Asthma Mother   . Emphysema Mother   . Hypertension Mother     Review of Systems:  Review of Systems - See HPI. No focal neuro deficits. All other ROS (-)   Physical Exam:  Blood pressure 147/80, pulse 84, temperature 98.1 F (36.7 C), temperature source Oral, resp. rate 20, SpO2 97 %. Filed Vitals:   01/24/14 1549 01/24/14 1556 01/24/14 1830  BP:  168/94 147/80  Pulse:  90 84  Temp:  98.1 F (36.7 C)   TempSrc:  Oral   Resp:  17 20  SpO2: 98% 99% 97%   General appearance: nervous and anxious Head: Normocephalic, without obvious abnormality, atraumatic Eyes: conjunctivae/corneas clear. PERRL, EOM's intact.  Nose: Nares normal. Septum midline. Mucosa normal. No drainage or sinus tenderness. Throat: lips, mucosa, and tongue normal; teeth and gums normal Neck: no adenopathy, no carotid bruit, no JVD and thyroid not enlarged, symmetric, no tenderness/mass/nodules Resp: CTA B Cardio: Reg GI: soft, non-tender; bowel sounds normal; no masses,  no organomegaly Extremities: extremities normal, atraumatic, no cyanosis or edema Pulses: 2+ and symmetric Lymph nodes:  no cervical lymphadenopathy Neurologic: Alert and oriented X 3, Very repetitive.  Nervous.  Constantly asking about Sz. normal strength and tone. Normal symmetric reflexes.     Labs on Admission:   Recent Labs  01/24/14 1610 01/24/14 1624  NA 141 143  K 3.6 3.6  CL 103 103  CO2 28  --   GLUCOSE 112* 112*  BUN 11 12   CREATININE 0.64 0.60  CALCIUM 10.4  --     Recent Labs  01/24/14 1610  AST 43*  ALT 53*  ALKPHOS 61  BILITOT 0.8  PROT 7.1  ALBUMIN 4.5   No results for input(s): LIPASE, AMYLASE in the last 72 hours.  Recent Labs  01/24/14 1610 01/24/14 1624  WBC 7.1  --   NEUTROABS 5.1  --   HGB 14.1 15.3*  HCT 42.3 45.0  MCV 88.9  --   PLT 233  --    No results for input(s): CKTOTAL, CKMB, CKMBINDEX, TROPONINI in the last 72 hours. Lab Results  Component Value Date   INR 0.98 01/24/2014   INR 1.01 11/13/2009  LAB RESULT POCT:  Results for orders placed or performed during the hospital encounter of 01/24/14  Ethanol  Result Value Ref Range   Alcohol, Ethyl (B) <5 0 - 9 mg/dL  Protime-INR  Result Value Ref Range   Prothrombin Time 13.1 11.6 - 15.2 seconds   INR 0.98 0.00 - 1.49  APTT  Result Value Ref Range   aPTT 28 24 - 37 seconds  CBC  Result Value Ref Range   WBC 7.1 4.0 - 10.5 K/uL   RBC 4.76 3.87 - 5.11 MIL/uL   Hemoglobin 14.1 12.0 - 15.0 g/dL   HCT 42.3 36.0 - 46.0 %   MCV 88.9 78.0 - 100.0 fL   MCH 29.6 26.0 - 34.0 pg   MCHC 33.3 30.0 - 36.0 g/dL   RDW 13.3 11.5 - 15.5 %   Platelets 233 150 - 400 K/uL  Differential  Result Value Ref Range   Neutrophils Relative % 72 43 - 77 %   Neutro Abs 5.1 1.7 - 7.7 K/uL   Lymphocytes Relative 21 12 - 46 %   Lymphs Abs 1.5 0.7 - 4.0 K/uL   Monocytes Relative 5 3 - 12 %   Monocytes Absolute 0.4 0.1 - 1.0 K/uL   Eosinophils Relative 2 0 - 5 %   Eosinophils Absolute 0.1 0.0 - 0.7 K/uL   Basophils Relative 0 0 - 1 %   Basophils Absolute 0.0 0.0 - 0.1 K/uL  Comprehensive metabolic panel  Result Value Ref Range   Sodium 141 135 - 145 mmol/L   Potassium 3.6 3.5 - 5.1 mmol/L   Chloride 103 96 - 112 mEq/L   CO2 28 19 - 32 mmol/L   Glucose, Bld 112 (H) 70 - 99 mg/dL   BUN 11 6 - 23 mg/dL   Creatinine, Ser 0.64 0.50 - 1.10 mg/dL   Calcium 10.4 8.4 - 10.5 mg/dL   Total Protein 7.1 6.0 - 8.3 g/dL   Albumin 4.5 3.5  - 5.2 g/dL   AST 43 (H) 0 - 37 U/L   ALT 53 (H) 0 - 35 U/L   Alkaline Phosphatase 61 39 - 117 U/L   Total Bilirubin 0.8 0.3 - 1.2 mg/dL   GFR calc non Af Amer >90 >90 mL/min   GFR calc Af Amer >90 >90 mL/min   Anion gap 10 5 - 15  Urine Drug Screen  Result Value Ref Range   Opiates NONE DETECTED NONE DETECTED   Cocaine NONE DETECTED NONE DETECTED   Benzodiazepines NONE DETECTED NONE DETECTED   Amphetamines NONE DETECTED NONE DETECTED   Tetrahydrocannabinol NONE DETECTED NONE DETECTED   Barbiturates NONE DETECTED NONE DETECTED  Urinalysis, Routine w reflex microscopic  Result Value Ref Range   Color, Urine YELLOW YELLOW   APPearance CLEAR CLEAR   Specific Gravity, Urine 1.006 1.005 - 1.030   pH 7.0 5.0 - 8.0   Glucose, UA NEGATIVE NEGATIVE mg/dL   Hgb urine dipstick NEGATIVE NEGATIVE   Bilirubin Urine NEGATIVE NEGATIVE   Ketones, ur NEGATIVE NEGATIVE mg/dL   Protein, ur NEGATIVE NEGATIVE mg/dL   Urobilinogen, UA 0.2 0.0 - 1.0 mg/dL   Nitrite NEGATIVE NEGATIVE   Leukocytes, UA NEGATIVE NEGATIVE  I-Stat Chem 8, ED  Result Value Ref Range   Sodium 143 135 - 145 mmol/L   Potassium 3.6 3.5 - 5.1 mmol/L   Chloride 103 96 - 112 mEq/L   BUN 12 6 - 23 mg/dL   Creatinine, Ser 0.60 0.50 - 1.10 mg/dL  Glucose, Bld 112 (H) 70 - 99 mg/dL   Calcium, Ion 1.33 (H) 1.13 - 1.30 mmol/L   TCO2 25 0 - 100 mmol/L   Hemoglobin 15.3 (H) 12.0 - 15.0 g/dL   HCT 45.0 36.0 - 46.0 %  I-Stat Troponin, ED (not at [facility])  Result Value Ref Range   Troponin i, poc 0.02 0.00 - 0.08 ng/mL   Comment 3              Radiological Exams on Admission: Mr Virgel Paling Wo Contrast  01/24/2014   CLINICAL DATA:  Acute memory dysfunction.  Amnesia.  EXAM: MRA HEAD WITHOUT CONTRAST  TECHNIQUE: Angiographic images of the Circle of Willis were obtained using MRA technique without intravenous contrast.  COMPARISON:  MRI earlier today.  MRI studies 08/13/2012.  FINDINGS: Both internal carotid arteries are widely patent into  the brain. The anterior and middle cerebral vessels are normal without proximal stenosis, aneurysm or vascular malformation. Both vertebral arteries are widely patent with the left being dominant. The right vertebral artery supplies PICA, with a small contribution beyond that to the basilar. No basilar stenosis. Posterior circulation branch vessels appear normal.  IMPRESSION: Normal intracranial MR angiography of the large and medium size vessels.   Electronically Signed   By: Nelson Chimes M.D.   On: 01/24/2014 19:38   Mr Brain Wo Contrast  01/24/2014   CLINICAL DATA:  Acute memory loss beginning last night. Temporary amnesia.  EXAM: MRI HEAD WITHOUT CONTRAST  TECHNIQUE: Multiplanar, multiecho pulse sequences of the brain and surrounding structures were obtained without intravenous contrast.  COMPARISON:  08/13/2012  FINDINGS: The brain has a normal appearance on all pulse sequences without evidence of malformation, atrophy, old or acute infarction, mass lesion, hemorrhage, hydrocephalus or extra-axial collection. No pituitary mass. No fluid in the sinuses, middle ears or mastoids. No skull or skullbase lesion. There is flow in the major vessels at the base of the brain. Major venous sinuses show flow.  IMPRESSION: No change. Normal MRI of the brain. No cause of the presenting symptoms is identified.   Electronically Signed   By: Nelson Chimes M.D.   On: 01/24/2014 17:34      Orders placed or performed during the hospital encounter of 01/24/14  . EKG 12-Lead  . EKG 12-Lead  . ED EKG  . ED EKG     Assessment/Plan Principal Problem:   Transient global amnesia Active Problems:   History of breast cancer   Hypothyroidism   Hyperlipidemia   TIA (transient ischemic attack)   As assessed by Neuro: Anterograde memory dysfunction, non focal neuro-exam, and MRI brain without acute abnormality. Likely TGA, but differential include TIA and less likely partial seizure. MRA brain = Normal intracranial MR  angiography of the large and medium size vessels.  EEG ordered to complete TGA work up Will get A1C; Lipids, B12, TSH, CK/Trop I in am Carotids and 2d ECHO ordered Expected to be some to mostly improved in 24-48 hrs.  DVT proph c Lovenox ordered.  Lipids on meds.  Hyothyroid on meds.  Breast CA rxed in remission.  Elevated BP - Monitor and may need BP meds if stays elevated.  Anxiety c near panic like Sxs today - Xanax prn  On eye gtts.- ordered.   Ghislaine Harcum M 01/24/2014, 7:54 PM

## 2014-01-24 NOTE — Progress Notes (Signed)
Subjective:  This chart was scribed for [name], MD by Dellis Lloyd, ED Scribe at [facility].The patient was seen in exam room 07 and the patient's care was started at 2:42 PM.   Patient ID: Crystal Lloyd, female    DOB: Aug 28, 1945, 69 y.o.   MRN: [id number] Chief Complaint  Patient presents with  . Memory Loss    sudden onset today   HPI  HPI Comments: Crystal Lloyd is a 69 y.o. female who presents to [facility] complaining of sudden memory loss, onset today. Approximately 1 hour ago. She feels "spaced out".  According to her husband she was fine during a bike ride, when they got home she could not remember the day of the week or recent events. She does not remember what she had for breakfast today or dinner last night. Pt cannot remember much from yesterday. Her husband notes last night she was fine, with no trouble sleeping.  No history of cardiovascular disease. Is currently on a statin. No history of prior neurological problems. She states she is overly worked and stressed and husband corroborates this.  Pt denies dizziness, CP, SOB, weakness and numbness and trouble sleeping.   Seen two weeks ago at [facility] for abdominal pain. Pt was diagnosed with diverticulitis and she was treated with flagyl and cipro for relief. No other recent health changes  Review of Systems  Constitutional: Negative for fatigue and unexpected weight change.  HENT: Negative for hearing loss.   Eyes: Negative for visual disturbance.  Respiratory: Negative for shortness of breath.   Cardiovascular: Negative for chest pain and palpitations.  Gastrointestinal: Negative for abdominal pain.  Genitourinary: Negative for difficulty urinating.  Musculoskeletal: Negative for back pain and neck pain.  Neurological: Negative for dizziness, weakness, light-headedness, numbness and headaches.  Psychiatric/Behavioral: Positive for decreased concentration. Negative for sleep  disturbance and dysphoric mood. The patient is nervous/anxious.       Objective:  BP 172/92 mmHg  Pulse 98  Temp(Src) 98 F (36.7 C)  Resp 18  Ht 4\' 11"  (1.499 m)  Wt 115 lb (52.164 kg)  BMI 23.21 kg/m2  SpO2 98%  Physical Exam  Constitutional: She appears well-developed and well-nourished. She appears distressed.  Anxious and somewhat tremulous  HENT:  Head: Normocephalic and atraumatic.  Mouth/Throat: Oropharynx is clear and moist.  Eyes: Conjunctivae and EOM are normal. Pupils are equal, round, and reactive to light.  Neck: Normal range of motion. Neck supple.  Cardiovascular: Normal rate, regular rhythm, normal heart sounds and intact distal pulses.   No murmur heard. No carotid bruit  Pulmonary/Chest: Effort normal and breath sounds normal.  Musculoskeletal: Normal range of motion.  Neurological:  She is oriented to person and place and knows my name from past association but she is not oriented to time and she exhibits significant problems with recent memory Her last intact memory is yesterday--remembers caring for her grandkids last night and driving home is remembered although vaguely She has trouble remembering that we took her blood pressure and remembering that I used words like transient global amnesia Cranial nerves II through XII intact She has a mild tremulousness Pupils equal round reactive to light and accommodation and extraocular movements intact Finger to nose intact Deep tendon reflexes 3+ and symmetrical in the upper extremities and 2+ and symmetrical in lower extremities without clonus There are no sensory or motor losses Gait is normal as observed in collection of urinalysis She has no trouble  following directions  Nursing note and vitals reviewed. BP 172/92 mmHg  Pulse 98  Temp(Src) 98 F (36.7 C)  Resp 18  Ht 4\' 11"  (1.499 m)  Wt 115 lb (52.164 kg)  BMI 23.21 kg/m2  SpO2 98% Repeat blood pressure 155/90   given Xanax 0.25 mg  Assessment &  Plan:  Acute memory loss suggesting transient global amnesia but needing to have acute CNS hemorrhagic or ischemic process ruled out  [facility] via [facility] for stroke protocol    I have completed the patient encounter in its entirety as documented by the scribe, with editing by me where necessary. Crystal Lloyd, M.D.

## 2014-01-25 ENCOUNTER — Inpatient Hospital Stay (HOSPITAL_COMMUNITY): Payer: Medicare Other

## 2014-01-25 DIAGNOSIS — G459 Transient cerebral ischemic attack, unspecified: Secondary | ICD-10-CM

## 2014-01-25 DIAGNOSIS — I059 Rheumatic mitral valve disease, unspecified: Secondary | ICD-10-CM

## 2014-01-25 LAB — CBC
HCT: 39.5 % (ref 36.0–46.0)
Hemoglobin: 13 g/dL (ref 12.0–15.0)
MCH: 29.4 pg (ref 26.0–34.0)
MCHC: 32.9 g/dL (ref 30.0–36.0)
MCV: 89.4 fL (ref 78.0–100.0)
Platelets: 239 10*3/uL (ref 150–400)
RBC: 4.42 MIL/uL (ref 3.87–5.11)
RDW: 13.6 % (ref 11.5–15.5)
WBC: 6.3 10*3/uL (ref 4.0–10.5)

## 2014-01-25 LAB — CK: Total CK: 573 U/L — ABNORMAL HIGH (ref 7–177)

## 2014-01-25 LAB — LIPID PANEL
Cholesterol: 208 mg/dL — ABNORMAL HIGH (ref 0–200)
HDL: 55 mg/dL (ref >39)
LDL Cholesterol: 104 mg/dL — ABNORMAL HIGH (ref 0–99)
Total CHOL/HDL Ratio: 3.8 RATIO
Triglycerides: 247 mg/dL — ABNORMAL HIGH (ref <150)
VLDL: 49 mg/dL — ABNORMAL HIGH (ref 0–40)

## 2014-01-25 LAB — COMPREHENSIVE METABOLIC PANEL
ALT: 39 U/L — ABNORMAL HIGH (ref 0–35)
AST: 32 U/L (ref 0–37)
Albumin: 3.7 g/dL (ref 3.5–5.2)
Alkaline Phosphatase: 50 U/L (ref 39–117)
Anion gap: 7 (ref 5–15)
BUN: 9 mg/dL (ref 6–23)
CO2: 27 mmol/L (ref 19–32)
Calcium: 9.4 mg/dL (ref 8.4–10.5)
Chloride: 108 mEq/L (ref 96–112)
Creatinine, Ser: 0.61 mg/dL (ref 0.50–1.10)
GFR calc Af Amer: >90 mL/min (ref >90)
GFR calc non Af Amer: >90 mL/min (ref >90)
Glucose, Bld: 103 mg/dL — ABNORMAL HIGH (ref 70–99)
Potassium: 3.6 mmol/L (ref 3.5–5.1)
Sodium: 142 mmol/L (ref 135–145)
Total Bilirubin: 0.7 mg/dL (ref 0.3–1.2)
Total Protein: 5.9 g/dL — ABNORMAL LOW (ref 6.0–8.3)

## 2014-01-25 LAB — TSH: TSH: 0.637 u[IU]/mL (ref 0.350–4.500)

## 2014-01-25 LAB — TROPONIN I: Troponin I: <0.03 ng/mL (ref <0.031)

## 2014-01-25 MED ORDER — ACETAMINOPHEN 325 MG PO TABS: 650.0000 mg | ORAL_TABLET | Freq: Four times a day (QID) | ORAL | Status: DC | PRN

## 2014-01-25 MED ORDER — ASPIRIN 81 MG PO TBEC: 81.0000 mg | DELAYED_RELEASE_TABLET | Freq: Every day | ORAL | Status: DC

## 2014-01-25 MED ORDER — ASPIRIN EC 81 MG PO TBEC
81.0000 mg | DELAYED_RELEASE_TABLET | Freq: Every day | ORAL | Status: DC
  Administered 2014-01-25: 81 mg via ORAL
  Filled 2014-01-25: qty 1

## 2014-01-25 NOTE — Progress Notes (Signed)
  Echocardiogram 2D Echocardiogram has been performed.  Johny Chess 01/25/2014, 2:17 PM

## 2014-01-25 NOTE — Discharge Summary (Addendum)
Physician Discharge Summary  DISCHARGE SUMMARY   Patient ID: Crystal Lloyd MR#: [id number] DOB/AGE: September 05, 1945 69 y.o.   Attending 15 M  Patient's ZWC:HENID,POEUMPN Antony Haste, MD  Consults:  Neuro Hospitalist  Admit date: 01/24/2014 Discharge date: 01/25/2014  Discharge Diagnoses:  Principal Problem:   Transient global amnesia Active Problems:   History of breast cancer   Hypothyroidism   Hyperlipidemia   TIA (transient ischemic attack)   TGA (transient global amnesia)   Patient Active Problem List   Diagnosis Date Noted  . Transient global amnesia 01/24/2014  . TIA (transient ischemic attack) 01/24/2014  . TGA (transient global amnesia) 01/24/2014  . Vaginal atrophy   . Osteopenia   . AIN grade I   . Hearing loss 05/31/2011  . History of breast cancer 05/31/2011  . Hypothyroidism 05/31/2011  . Hyperlipidemia 05/31/2011   Past Medical History  Diagnosis Date  . Vaginal atrophy   . Osteopenia   . AIN grade III   . Cancer     breast  . Intraepithelial neoplasm   . Asthma   . Thyroid disease   . Anemia   . Joint problem   . Depression     Discharged Condition: better   Discharge Medications:   Medication List    TAKE these medications        acetaminophen 325 MG tablet  Commonly known as:  TYLENOL  Take 2 tablets (650 mg total) by mouth every 6 (six) hours as needed for mild pain, moderate pain or fever (or Fever >/= 101).     ALPRAZolam 0.5 MG tablet  Commonly known as:  XANAX  Take 0.5 mg by mouth at bedtime as needed for anxiety.     aspirin 81 MG EC tablet  Take 1 tablet (81 mg total) by mouth daily.     B-2-400 PO  Take 200 mg by mouth 2 (two) times daily.     Co Q 10 100 MG Caps  Take 300 mg by mouth daily.     ergocalciferol 50000 UNITS capsule  Commonly known as:  VITAMIN D2  Take 50,000 Units by mouth once a week. Every Sunday     FISH OIL PO  Take 2 g by mouth 2 (two) times daily.     levothyroxine 75 MCG  tablet  Commonly known as:  SYNTHROID, LEVOTHROID  Take 75 mcg by mouth daily.     montelukast 10 MG tablet  Commonly known as:  SINGULAIR  Take 10 mg by mouth at bedtime.     neomycin-bacitracin-polymyxin ointment  Commonly known as:  NEOSPORIN  Apply 1 application topically every 12 (twelve) hours. To toe as preventative     polyethylene glycol packet  Commonly known as:  MIRALAX / GLYCOLAX  Take 17 g by mouth daily.     RESTASIS 0.05 % ophthalmic emulsion  Generic drug:  cycloSPORINE  Place 1 drop into both eyes 2 (two) times daily.     rosuvastatin 10 MG tablet  Commonly known as:  CRESTOR  Take 10 mg by mouth daily.     Tavaborole 5 % Soln  Commonly known as:  KERYDIN  Apply one drop each affected nail once daily for 12 months        Hospital Procedures: Mr Virgel Paling Wo Contrast  01/24/2014   CLINICAL DATA:  Acute memory dysfunction.  Amnesia.  EXAM: MRA HEAD WITHOUT CONTRAST  TECHNIQUE: Angiographic images of the Circle of Willis were obtained using MRA technique without intravenous contrast.  COMPARISON:  MRI earlier today.  MRI studies 08/13/2012.  FINDINGS: Both internal carotid arteries are widely patent into the brain. The anterior and middle cerebral vessels are normal without proximal stenosis, aneurysm or vascular malformation. Both vertebral arteries are widely patent with the left being dominant. The right vertebral artery supplies PICA, with a small contribution beyond that to the basilar. No basilar stenosis. Posterior circulation branch vessels appear normal.  IMPRESSION: Normal intracranial MR angiography of the large and medium size vessels.   Electronically Signed   By: Nelson Chimes M.D.   On: 01/24/2014 19:38   Mr Brain Wo Contrast  01/24/2014   CLINICAL DATA:  Acute memory loss beginning last night. Temporary amnesia.  EXAM: MRI HEAD WITHOUT CONTRAST  TECHNIQUE: Multiplanar, multiecho pulse sequences of the brain and surrounding structures were obtained  without intravenous contrast.  COMPARISON:  08/13/2012  FINDINGS: The brain has a normal appearance on all pulse sequences without evidence of malformation, atrophy, old or acute infarction, mass lesion, hemorrhage, hydrocephalus or extra-axial collection. No pituitary mass. No fluid in the sinuses, middle ears or mastoids. No skull or skullbase lesion. There is flow in the major vessels at the base of the brain. Major venous sinuses show flow.  IMPRESSION: No change. Normal MRI of the brain. No cause of the presenting symptoms is identified.   Electronically Signed   By: Nelson Chimes M.D.   On: 01/24/2014 17:34   Ct Abdomen Pelvis W Contrast  01/05/2014   CLINICAL DATA:  Right lower quadrant abdominal pain beginning yesterday. History of diverticulosis. History of partial colectomy. History of appendectomy.  EXAM: CT ABDOMEN AND PELVIS WITH CONTRAST  TECHNIQUE: Multidetector CT imaging of the abdomen and pelvis was performed using the standard protocol following bolus administration of intravenous contrast.  CONTRAST:  41mL OMNIPAQUE IOHEXOL 300 MG/ML SOLN, 122mL OMNIPAQUE IOHEXOL 300 MG/ML SOLN  COMPARISON:  05/07/2010  FINDINGS: Minimal dependent atelectasis is present in the lung bases. There is no pleural effusion.  The liver, gallbladder, spleen, adrenal glands, kidneys, and pancreas have an unremarkable enhanced appearance.  There is a small sliding hiatal hernia. Oral contrast is present in multiple loops of nondilated small bowel without evidence of obstruction. Scattered colonic diverticula are noted. There is focal, moderate wall thickening involving the proximal transverse colon with surrounding inflammatory fat stranding, possibly with an inflamed diverticulum in the center of the inflammation. No extraluminal gas or abscess is identified. There is a moderate amount of stool throughout the colon.  Bladder is unremarkable. Uterus is grossly unremarkable. Appendix is not identified consistent with  history of appendectomy. No free fluid or enlarged lymph nodes are identified. Mild atherosclerotic aortoiliac calcification is noted. Grade 1 anterolisthesis is again noted of L3 and L4, with interval PLIF at this level.  IMPRESSION: Focal wall thickening and inflammation involving the proximal transverse colon, favored to represent acute diverticulitis. No abscess.   Electronically Signed   By: Logan Bores   On: 01/05/2014 21:03   Dg Abd Acute W/chest  01/05/2014   CLINICAL DATA:  Right lower quadrant abdominal pain.  EXAM: ACUTE ABDOMEN SERIES (ABDOMEN 2 VIEW & CHEST 1 VIEW)  COMPARISON:  August 21, 2009.  FINDINGS: There is no evidence of dilated bowel loops or free intraperitoneal air. Phleboliths are noted in the pelvis. Status post surgical posterior fusion of L3-4. Moderate stool burden is noted. Heart size and mediastinal contours are within normal limits. Both lungs are clear.  IMPRESSION: No evidence of bowel obstruction or  ileus. Moderate stool burden is noted which may represent constipation. No acute cardiopulmonary abnormality seen.   Electronically Signed   By: Sabino Dick M.D.   On: 01/05/2014 17:15    History of Present Illness:  69 y.o. female c Hyperlipidemia on Crestor, Prior Breast CA, and Hypothyroid on Synthroid brought to UC and eventually the ED for evaluation of sudden onset of amnesia. Husband states she was fine during her bike ride this AM but upon returning home around 2 pm she couldn't remember certain gifts given to her by friends during [place]. She was not really confused, " just couldn't remember and was not able to recall today's day". Husband recognized that she had memory loss and kept quizzing her. She was able to walk in the house and upstair to get dress before being picked up by EMS who transported patient to the ED. On transport she was alert and oriented but continued to ask where she was. Never had similar episode before. Also at [facility] shge was so nervous  they gave her a xanax. While in the ED patient remains continually asking same questions over and over again. Patient denies HA, vertigo, double vision, focal weakness or numbness, slurred speech, language or vision impairment. There is no dysarthria, receptive or expressive aphasia and she is able to walk.  No recent head trauma, or new medications. MRI brain showed no acute intracranial abnormality. She has somewhat stressful day yesterday regarding some issues at work. Prior to that in the last week she had been doing well and taken a trip to the beach. 2 weeks ago she was treated in the emergency department for diverticulitis. She received Cipro, and Flagyl for that treatment. No further Sxs of diverticulitis. She is taking her usual medications. There are no other known modifying factors.  She was seen by EDP and Neuro Hospitalists. Neuro stated Likely TGA, but differential include TIA and less likely partial seizure. He Ordered MRA brain and EEG in order to complete TGA work up  I was asked to do her admission.  BP was High in ED.  Hospital Course: Admitted 01/24/14 for TGA Cannot remember from Breakfast yesterday to @ 7 pm last night Back to baseline today. MRI/A (-) Much less anxious. No focal neuro findings. 2D Echocardiogram P Carotids - Preliminary report: 1-39% ICA stenosis. Vertebral artery flow is antegrade. EEG - I talked c neuro and EEG was Normal.  No Sz  She will go home  TGA/TIA. MRI/A (-).She has known Atypical Migraines - wonder if this TGA episode is related. Dr Orson Aloe has been her HA and wellness Doc and ran her prior MRA's.  She will be d/ced to follow up outpt.  No restrictions.  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  OK to resume PT that she was doing post her spinal fusion surgery.  She will also start Baby ASA from here.  Elevated CK 573 - Muscle soreness from beach trip has improved. She has had long bike rides as  well. She is not sore now. Recheck CK soon as outpt. Continue Lipid meds. Doubt her mild Rhabdo contributed to her issues  DVT proph c Lovenox/SCDs provided.  Lipids on meds. crestor 10 and Omega 3. Better results than in office at: Cholesterol 0 - 200 mg/dL 208 (H)   Triglycerides <150 mg/dL 247 (H)   HDL >39 mg/dL 55   Total CHOL/HDL Ratio RATIO 3.8   VLDL 0 - 40 mg/dL 49 (H)  LDL Cholesterol 0 - 99 mg/dL 104 (H)     Hyothyroid on meds. TSH fine  Breast CA rxed in remission.  Elevated BP in ED back to normal - lowish. No need for meds.  Anxiety c near panic like Sxs - she is back to normal  On 01/25/14 we ordered: Hep lock IV, walk, eat, D/c tele. She did well and went home in the afternoon.  On eye gtts   Day of Discharge Exam BP 99/74 mmHg  Pulse 57  Temp(Src) 97.4 F (36.3 C) (Oral)  Resp 16  Ht 4\' 11"  (1.499 m)  Wt 52.028 kg (114 lb 11.2 oz)  BMI 23.15 kg/m2  SpO2 97%  Physical Exam: See PN  Discharge Labs:  Recent Labs  01/24/14 1610 01/24/14 1624 01/25/14 0400  NA 141 143 142  K 3.6 3.6 3.6  CL 103 103 108  CO2 28  --  27  GLUCOSE 112* 112* 103*  BUN 11 12 9   CREATININE 0.64 0.60 0.61  CALCIUM 10.4  --  9.4    Recent Labs  01/24/14 1610 01/25/14 0400  AST 43* 32  ALT 53* 39*  ALKPHOS 61 50  BILITOT 0.8 0.7  PROT 7.1 5.9*  ALBUMIN 4.5 3.7    Recent Labs  01/24/14 1610 01/24/14 1624 01/25/14 0400  WBC 7.1  --  6.3  NEUTROABS 5.1  --   --   HGB 14.1 15.3* 13.0  HCT 42.3 45.0 39.5  MCV 88.9  --  89.4  PLT 233  --  239    Recent Labs  01/25/14 0400  CKTOTAL 573*  TROPONINI <0.03    Recent Labs  01/25/14 0400  TSH 0.637   No results for input(s): VITAMINB12, FOLATE, FERRITIN, TIBC, IRON, RETICCTPCT in the last 72 hours. Lab Results  Component Value Date   INR 0.98 01/24/2014   INR 1.01 11/13/2009       Discharge instructions:  01-Home or Self Care     Follow-up  Information    Follow up with Sheela Stack, MD In 1 week.   Specialty:  Endocrinology   Contact information:   [address] [place] [address] [phone]        Disposition: Home  Follow-up Appts: Follow-up with Dr. Forde Dandy at [facility] in 1 week.  Call for appointment.  Condition on Discharge: Stable  Tests Needing Follow-up: CMET/CK F/Up on final Carotid, EEG, ECHO reports.  Time spent in discharge (includes decision making & examination of pt): 32 min  Signed: Alexzandria Massman M 01/25/2014, 2:32 PM

## 2014-01-25 NOTE — Progress Notes (Signed)
Subjective: Cannot remember from Breakfast yesterday to @ 7 pm last night Feels closer to baseline. Much less anxious. No focal neuro findings.  Objective: Vital signs in last 24 hours: Temp:  [97.4 F (36.3 C)-98.3 F (36.8 C)] 97.4 F (36.3 C) (01/02 0530) Pulse Rate:  [57-98] 57 (01/02 0530) Resp:  [16-20] 16 (01/02 0530) BP: (99-172)/(73-94) 99/74 mmHg (01/02 0530) SpO2:  [96 %-99 %] 97 % (01/02 0530) Weight:  [52.028 kg (114 lb 11.2 oz)-52.164 kg (115 lb)] 52.028 kg (114 lb 11.2 oz) (01/02 0500) Weight change:  Last BM Date: 01/24/14  CBG (last 3)  No results for input(s): GLUCAP in the last 72 hours.  Intake/Output from previous day: No intake or output data in the 24 hours ending 01/25/14 0752     Physical Exam  General appearance: Much more calm. OP clear Neck: no adenopathy, no carotid bruit, no JVD and thyroid not enlarged, symmetric, no tenderness/mass/nodules Resp: CTA B Cardio: Reg GI: soft, non-tender; bowel sounds normal; no masses, no organomegaly Extremities: extremities normal, atraumatic, no cyanosis or edema Pulses: 2+ and symmetric Lymph nodes: no cervical lymphadenopathy Neurologic: Alert and oriented X 3 much more coherent. normal strength and tone. Normal symmetric reflexes.    Lab Results:  Recent Labs  01/24/14 1610 01/24/14 1624 01/25/14 0400  NA 141 143 142  K 3.6 3.6 3.6  CL 103 103 108  CO2 28  --  27  GLUCOSE 112* 112* 103*  BUN 11 12 9   CREATININE 0.64 0.60 0.61  CALCIUM 10.4  --  9.4     Recent Labs  01/24/14 1610 01/25/14 0400  AST 43* 32  ALT 53* 39*  ALKPHOS 61 50  BILITOT 0.8 0.7  PROT 7.1 5.9*  ALBUMIN 4.5 3.7     Recent Labs  01/24/14 1610 01/24/14 1624 01/25/14 0400  WBC 7.1  --  6.3  NEUTROABS 5.1  --   --   HGB 14.1 15.3* 13.0  HCT 42.3 45.0 39.5  MCV 88.9  --  89.4  PLT 233  --  239    Lab Results  Component Value Date   INR 0.98 01/24/2014   INR 1.01 11/13/2009     Recent  Labs  01/25/14 0400  CKTOTAL 573*  TROPONINI <0.03     Recent Labs  01/25/14 0400  TSH 0.637    No results for input(s): VITAMINB12, FOLATE, FERRITIN, TIBC, IRON, RETICCTPCT in the last 72 hours.  Micro Results: No results found for this or any previous visit (from the past 240 hour(s)).   Studies/Results: Mr Virgel Paling Wo Contrast  01/24/2014   CLINICAL DATA:  Acute memory dysfunction.  Amnesia.  EXAM: MRA HEAD WITHOUT CONTRAST  TECHNIQUE: Angiographic images of the Circle of Willis were obtained using MRA technique without intravenous contrast.  COMPARISON:  MRI earlier today.  MRI studies 08/13/2012.  FINDINGS: Both internal carotid arteries are widely patent into the brain. The anterior and middle cerebral vessels are normal without proximal stenosis, aneurysm or vascular malformation. Both vertebral arteries are widely patent with the left being dominant. The right vertebral artery supplies PICA, with a small contribution beyond that to the basilar. No basilar stenosis. Posterior circulation branch vessels appear normal.  IMPRESSION: Normal intracranial MR angiography of the large and medium size vessels.   Electronically Signed   By: Nelson Chimes M.D.   On: 01/24/2014 19:38   Mr Brain Wo Contrast  01/24/2014   CLINICAL DATA:  Acute memory loss beginning last  night. Temporary amnesia.  EXAM: MRI HEAD WITHOUT CONTRAST  TECHNIQUE: Multiplanar, multiecho pulse sequences of the brain and surrounding structures were obtained without intravenous contrast.  COMPARISON:  08/13/2012  FINDINGS: The brain has a normal appearance on all pulse sequences without evidence of malformation, atrophy, old or acute infarction, mass lesion, hemorrhage, hydrocephalus or extra-axial collection. No pituitary mass. No fluid in the sinuses, middle ears or mastoids. No skull or skullbase lesion. There is flow in the major vessels at the base of the brain. Major venous sinuses show flow.  IMPRESSION: No change. Normal  MRI of the brain. No cause of the presenting symptoms is identified.   Electronically Signed   By: Nelson Chimes M.D.   On: 01/24/2014 17:34     Medications: Scheduled: . cycloSPORINE  1 drop Both Eyes BID  . enoxaparin (LOVENOX) injection  40 mg Subcutaneous QHS  . levothyroxine  75 mcg Oral QAC breakfast  . montelukast  10 mg Oral QHS  . omega-3 acid ethyl esters  1 g Oral BID  . rosuvastatin  10 mg Oral QHS  . sodium chloride  3 mL Intravenous Q12H  . [START ON 01/26/2014] Vitamin D (Ergocalciferol)  50,000 Units Oral Q Sun   Continuous: . sodium chloride 1,000 mL (01/24/14 2234)     Assessment/Plan: Principal Problem:   Transient global amnesia Active Problems:   History of breast cancer   Hypothyroidism   Hyperlipidemia   TIA (transient ischemic attack)   TGA (transient global amnesia)   TGA/TIA.  MRI/A (-).  She is much closer to baseline.  Will get EEG/Carotids.ECHO and hopefully home later when tests (-) She has known Atypical Migraines - wonder if this TGA episode is related.  Dr Orson Aloe has been her HA and wellness Doc and ran her prior MRA's Elevated CK 573 - Muscle soreness from beach trip has improved. She has had long bike rides as well.  She is not sore now. Recheck  CK soon.  Continue Lipid meds.  DVT proph c Lovenox/SCDs.  Lipids on meds.  crestor 10 and Omega 3.  Better results than in office at: Cholesterol 0 - 200 mg/dL 208 (H)   Triglycerides <150 mg/dL 247 (H)   HDL >39 mg/dL 55   Total CHOL/HDL Ratio RATIO 3.8   VLDL 0 - 40 mg/dL 49 (H)   LDL Cholesterol 0 - 99 mg/dL 104 (H)     Hyothyroid on meds.  TSH fine  Breast CA rxed in remission.  Elevated BP in ED back to normal - lowish.  No need for meds.  Anxiety c near panic like Sxs - she is back to normal  Hep lock IV, walk, eat, D/c tele.  Get tests and go home is the plan.  May need ASA??  On eye gtts   LOS: 1 day   Crystal Lloyd M 01/25/2014, 7:52 AM

## 2014-01-25 NOTE — Progress Notes (Signed)
Discharge instructions given to pt. Pt verbally acknowledged understanding. ptvoicedno questions when prompted. pt to be transported home by private car by husband. Pt needs time to get dressed and stated will walk out of unit. Husband at bedside. Pt refused wheelchair when one was offered  Will monitor   Angeline Slim I 01/25/2014 5:39 PM

## 2014-01-25 NOTE — Procedures (Signed)
EEG report.  Brief clinical history: 69 y/o with acute onset of anterograde memory dysfunction. MRI/MRA brain unremarkable. No prior history of frank epileptic seizures.  Technique: this is a 17 channel routine scalp EEG performed at the bedside with bipolar and monopolar montages arranged in accordance to the international 10/20 system of electrode placement. One channel was dedicated to EKG recording.  The study was performed during wakefulness and drowsiness. Hyperventilation and intermittent photic stimulation were utilized as activating procedures.  Description:In the wakeful state, the best background consisted of a medium amplitude, posterior dominant, well sustained, symmetric and reactive 12 Hz rhythm. Drowsiness demonstrated dropout of the alpha rhythm. Hyperventilation did not induce physiologic slowing or epileptiform discharges. Intermittent photic stimulation did induce a normal driving response.  No focal or generalized epileptiform discharges noted.  No pathologic areas of slowing seen.  EKG showed sinus rhythm.   Impression: this is a normal awake and drowsy EEG. Please, be aware that a normal EEG does not exclude the possibility of epilepsy.  Clinical correlation is advised.   Dorian Pod, MD

## 2014-01-25 NOTE — Progress Notes (Signed)
VASCULAR LAB PRELIMINARY  PRELIMINARY  PRELIMINARY  PRELIMINARY  Carotid Dopplers completed.    Preliminary report:  1-39% ICA stenosis.  Vertebral artery flow is antegrade,.   Diandre Merica, RVT 01/25/2014, 12:18 PM   ;

## 2014-01-25 NOTE — Progress Notes (Signed)
EEG completed, results pending. 

## 2014-01-25 NOTE — Progress Notes (Signed)
Pt walked out of unit with husband at side with pt's belongings. No acute distress noted.   Angeline Slim I 01/25/2014 6:13 PM

## 2014-01-26 LAB — VITAMIN B12: Vitamin B-12: 631 pg/mL (ref 211–911)

## 2014-01-26 LAB — HEMOGLOBIN A1C
Hgb A1c MFr Bld: 6 % — ABNORMAL HIGH (ref <5.7)
Mean Plasma Glucose: 126 mg/dL — ABNORMAL HIGH (ref <117)

## 2014-01-27 NOTE — Progress Notes (Signed)
RETRO / UR COMPLETED

## 2014-04-30 ENCOUNTER — Telehealth: Payer: Self-pay | Admitting: *Deleted

## 2014-04-30 MED ORDER — TAVABOROLE 5 % EX SOLN: CUTANEOUS | Status: DC

## 2014-04-30 NOTE — Telephone Encounter (Signed)
Pt request Cranford Mon to be called to [facility].  Dr. Blenda Mounts ordered refill as previous and prn 1 year.  Done.

## 2014-05-02 ENCOUNTER — Telehealth: Payer: Self-pay | Admitting: *Deleted

## 2014-05-02 MED ORDER — TAVABOROLE 5 % EX SOLN
CUTANEOUS | Status: DC
Start: 2014-05-02 — End: 2014-10-15

## 2014-05-02 NOTE — Telephone Encounter (Signed)
Pt called for the name of the topical anti-fungal medication Dr. Blenda Mounts prescribed.  Cranford Mon was prescribed on 04/30/2014 and ordered through [facility] with 11 refills.  I called the information to the pt.

## 2014-05-02 NOTE — Telephone Encounter (Signed)
Pt states [facility] states the [place] will be $1000.00/month, but they made her a compound similar to it.  Pt request a different option in ordering if there is one.  I ordered the rx as previously ordered by Dr. Blenda Mounts from [facility], and left message for pt.

## 2014-09-22 ENCOUNTER — Ambulatory Visit: Payer: Self-pay | Admitting: Surgery

## 2014-10-14 ENCOUNTER — Encounter (HOSPITAL_BASED_OUTPATIENT_CLINIC_OR_DEPARTMENT_OTHER): Payer: Self-pay | Admitting: *Deleted

## 2014-10-15 ENCOUNTER — Encounter (HOSPITAL_BASED_OUTPATIENT_CLINIC_OR_DEPARTMENT_OTHER): Payer: Self-pay | Admitting: *Deleted

## 2014-10-15 NOTE — Progress Notes (Signed)
NPO AFTER MN. ARRIVE AT 0600. NEEDS HG.  WILL TAKE SYNTHROID AM DOS W/ SIPS OF WATER.

## 2014-10-20 ENCOUNTER — Encounter (HOSPITAL_BASED_OUTPATIENT_CLINIC_OR_DEPARTMENT_OTHER): Payer: Self-pay | Admitting: Surgery

## 2014-10-20 DIAGNOSIS — R748 Abnormal levels of other serum enzymes: Secondary | ICD-10-CM | POA: Diagnosis present

## 2014-10-20 NOTE — H&P (Signed)
General Surgery [facility]  Crystal Lloyd DOB: [birth year] Married / Language: English / Race: White Female  History of Present Illness  The patient is a 69 year old female who presents with a complaint of Muscle weakness. Patient referred by Dr. Weston Brass and Dr. Carrolyn Meiers for evaluation of elevated creatine kinase level and muscle weakness. 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 We reviewed her clinical history beginning with her lumbar spine fusion at [facility] last year and her episode of global transient amnesia in January of this year. Patient had been taking Crestor. She was noted to have an elevated CK level. Repeat level in July 2016 remained elevated at 505. Muscle biopsy is requested to rule out muscle fiber atrophy or necrosis. Patient has mainly been symptomatic in the lower extremities, particularly on the left, which may be related to her spine fusion.   Other Problems Breast Cancer Diverticulosis Hypercholesterolemia Other disease, cancer, significant illness  Past Surgical History Appendectomy Colon Polyp Removal - Colonoscopy Colon Removal - Partial Knee Surgery Bilateral. Mastectomy Bilateral. Sentinel Lymph Node Biopsy Spinal Surgery - Lower Back  Diagnostic Studies History Colonoscopy within last year Mammogram >3 years ago Pap Smear 1-5 years ago  Allergies Codeine Sulfate *ANALGESICS - OPIOID* Compazine *ANTIPSYCHOTICS/ANTIMANIC AGENTS* Morphine Sulfate *ANALGESICS - OPIOID*  Medication History Tylenol (325MG  Tablet, Oral) Active. ALPRAZolam (0.25MG  Tablet, Oral) Active. Aspirin (81MG  Tablet DR, Oral) Active. Coenzyme Q10 (200MG  Capsule, Oral) Active. Vitamin D (Ergocalciferol) (50000UNIT Capsule, Oral) Active. Levothyroxine Sodium (75MCG Tablet, Oral) Active. Fish Oil (1000MG  Capsule, Oral) Active. Riboflavin (400MG  Capsule,  Oral) Active.  Social History Alcohol use Moderate alcohol use. Caffeine use Coffee, Tea. No drug use Tobacco use Never smoker.  Family History Alcohol Abuse Brother, Father, Mother. Arthritis Mother. Hypertension Mother. Ischemic Bowel Disease Mother. Respiratory Condition Mother.  Pregnancy / Birth History Age at menarche 53 years. Age of menopause 65-50 Gravida 2 Irregular periods Maternal age 54-25 Para 2  Review of Systems General Not Present- Appetite Loss, Chills, Fatigue, Fever, Night Sweats, Weight Gain and Weight Loss. Skin Not Present- Change in Wart/Mole, Dryness, Hives, Jaundice, New Lesions, Non-Healing Wounds, Rash and Ulcer. HEENT Present- Hearing Loss and Wears glasses/contact lenses. Not Present- Earache, Hoarseness, Nose Bleed, Oral Ulcers, Ringing in the Ears, Seasonal Allergies, Sinus Pain, Sore Throat, Visual Disturbances and Yellow Eyes. Breast Not Present- Breast Mass, Breast Pain, Nipple Discharge and Skin Changes. Cardiovascular Not Present- Chest Pain, Difficulty Breathing Lying Down, Leg Cramps, Palpitations, Rapid Heart Rate, Shortness of Breath and Swelling of Extremities. Gastrointestinal Not Present- Abdominal Pain, Bloating, Bloody Stool, Change in Bowel Habits, Chronic diarrhea, Constipation, Difficulty Swallowing, Excessive gas, Gets full quickly at meals, Hemorrhoids, Indigestion, Nausea, Rectal Pain and Vomiting. Female Genitourinary Not Present- Frequency, Nocturia, Painful Urination, Pelvic Pain and Urgency. Musculoskeletal Not Present- Back Pain, Joint Pain, Joint Stiffness, Muscle Pain, Muscle Weakness and Swelling of Extremities. Neurological Present- Numbness. Not Present- Decreased Memory, Fainting, Headaches, Seizures, Tingling, Tremor, Trouble walking and Weakness. Psychiatric Not Present- Anxiety, Bipolar, Change in Sleep Pattern, Depression, Fearful and Frequent crying. Endocrine Not Present- Cold Intolerance, Excessive  Hunger, Hair Changes, Heat Intolerance, Hot flashes and New Diabetes. Hematology Not Present- Easy Bruising, Excessive bleeding, Gland problems, HIV and Persistent Infections.   Vitals Weight: 117.2 lb Height: 60in Body Surface Area: 1.5 m Body Mass Index: 22.89 kg/m Temp.: 97.5F(Oral)  Pulse: 62 (Regular)  BP: 106/66 (Sitting, Left Arm, Standard)    Physical  Exam  General - appears comfortable, no distress; not diaphorectic  HEENT - normocephalic; sclerae clear, gaze conjugate; mucous membranes moist, dentition good; voice normal  Neck - symmetric on extension; no palpable anterior or posterior cervical adenopathy; no palpable masses in the thyroid bed  Chest - clear bilaterally without rhonchi, rales, or wheeze  Cor - regular rhythm with normal rate; no significant murmur  Ext - non-tender without significant edema or lymphedema  Neuro - grossly intact; no tremor    Assessment & Plan  ELEVATED CREATINE KINASE (790.5  R74.8)  Patient presents today accompanied by her husband. Dr. Isaiah Blakes and I have previously discussed her impending muscle biopsy over the telephone. I have reviewed written records provided by her rheumatologist.  We will plan to proceed with left thigh vastus medialis muscle biopsy. This can be performed as an outpatient surgical procedure. We will plan general anesthesia. I will discuss the handling of the muscle tissue with pathology prior to the procedure.  The risks and benefits of the procedure have been discussed at length with the patient. The patient understands the proposed procedure, potential alternative treatments, and the course of recovery to be expected. All of the patient's questions have been answered at this time. The patient wishes to proceed with surgery.  Earnstine Regal, MD, [facility] Office: [phone]

## 2014-10-20 NOTE — Anesthesia Preprocedure Evaluation (Addendum)
Anesthesia Evaluation  Patient identified by MRN, date of birth, ID band Patient awake    Reviewed: Allergy & Precautions, NPO status , Patient's Chart, lab work & pertinent test results  History of Anesthesia Complications (+) PONV and history of anesthetic complications  Airway Mallampati: II  TM Distance: >3 FB Neck ROM: Full    Dental no notable dental hx.    Pulmonary neg pulmonary ROS,    Pulmonary exam normal breath sounds clear to auscultation       Cardiovascular Exercise Tolerance: Good negative cardio ROS Normal cardiovascular exam Rhythm:Regular Rate:Normal  ECHO January 2016: EF 55-60%   Neuro/Psych H/O Transient global amnesia. TIAnegative psych ROS   GI/Hepatic negative GI ROS, Neg liver ROS,   Endo/Other  Hypothyroidism   Renal/GU negative Renal ROS  negative genitourinary   Musculoskeletal negative musculoskeletal ROS (+)   Abdominal   Peds negative pediatric ROS (+)  Hematology negative hematology ROS (+)   Anesthesia Other Findings   Reproductive/Obstetrics negative OB ROS                          Anesthesia Physical Anesthesia Plan  ASA: III  Anesthesia Plan: General   Post-op Pain Management:    Induction: Intravenous  Airway Management Planned: LMA  Additional Equipment:   Intra-op Plan:   Post-operative Plan: Extubation in OR  Informed Consent: I have reviewed the patients History and Physical, chart, labs and discussed the procedure including the risks, benefits and alternatives for the proposed anesthesia with the patient or authorized representative who has indicated his/her understanding and acceptance.   Dental advisory given  Plan Discussed with: CRNA  Anesthesia Plan Comments: (CK levels are high and she has thigh muscle weakness. Plan TIVA with propofol only. )      Anesthesia Quick Evaluation

## 2014-10-21 ENCOUNTER — Encounter (HOSPITAL_BASED_OUTPATIENT_CLINIC_OR_DEPARTMENT_OTHER): Payer: Self-pay | Admitting: *Deleted

## 2014-10-21 ENCOUNTER — Ambulatory Visit (HOSPITAL_BASED_OUTPATIENT_CLINIC_OR_DEPARTMENT_OTHER): Payer: Medicare Other | Admitting: Anesthesiology

## 2014-10-21 ENCOUNTER — Encounter (HOSPITAL_BASED_OUTPATIENT_CLINIC_OR_DEPARTMENT_OTHER)
Admission: RE | Disposition: A | Discharge status: Home / Self Care | Payer: Self-pay | Source: Ambulatory Visit | Attending: Surgery

## 2014-10-21 ENCOUNTER — Ambulatory Visit (HOSPITAL_BASED_OUTPATIENT_CLINIC_OR_DEPARTMENT_OTHER)
Admission: RE | Admit: 2014-10-21 | Discharge: 2014-10-21 | Disposition: A | Discharge status: Home / Self Care | Payer: Medicare Other | Source: Ambulatory Visit | Attending: Surgery | Admitting: Surgery

## 2014-10-21 DIAGNOSIS — E780 Pure hypercholesterolemia: Secondary | ICD-10-CM | POA: Insufficient documentation

## 2014-10-21 DIAGNOSIS — M791 Myalgia: Secondary | ICD-10-CM | POA: Diagnosis not present

## 2014-10-21 DIAGNOSIS — Z9013 Acquired absence of bilateral breasts and nipples: Secondary | ICD-10-CM | POA: Diagnosis not present

## 2014-10-21 DIAGNOSIS — Z7982 Long term (current) use of aspirin: Secondary | ICD-10-CM | POA: Diagnosis not present

## 2014-10-21 DIAGNOSIS — E039 Hypothyroidism, unspecified: Secondary | ICD-10-CM | POA: Diagnosis not present

## 2014-10-21 DIAGNOSIS — Z981 Arthrodesis status: Secondary | ICD-10-CM | POA: Diagnosis not present

## 2014-10-21 DIAGNOSIS — R748 Abnormal levels of other serum enzymes: Secondary | ICD-10-CM | POA: Diagnosis present

## 2014-10-21 DIAGNOSIS — Z79899 Other long term (current) drug therapy: Secondary | ICD-10-CM | POA: Diagnosis not present

## 2014-10-21 DIAGNOSIS — Z853 Personal history of malignant neoplasm of breast: Secondary | ICD-10-CM | POA: Insufficient documentation

## 2014-10-21 HISTORY — PX: MUSCLE BIOPSY: SHX716

## 2014-10-21 HISTORY — DX: Personal history of other infectious and parasitic diseases: Z86.19

## 2014-10-21 HISTORY — DX: Myalgia, unspecified site: M79.10

## 2014-10-21 HISTORY — DX: Presence of spectacles and contact lenses: Z97.3

## 2014-10-21 HISTORY — DX: Hypothyroidism, unspecified: E03.9

## 2014-10-21 HISTORY — DX: Other specified postprocedural states: R11.2

## 2014-10-21 HISTORY — DX: Presence of external hearing-aid: Z97.4

## 2014-10-21 HISTORY — DX: Other specified postprocedural states: Z98.890

## 2014-10-21 HISTORY — DX: Abnormal levels of other serum enzymes: R74.8

## 2014-10-21 HISTORY — DX: Personal history of other diseases of the digestive system: Z87.19

## 2014-10-21 HISTORY — DX: Diverticulosis of large intestine without perforation or abscess without bleeding: K57.30

## 2014-10-21 HISTORY — DX: Personal history of malignant neoplasm of breast: Z85.3

## 2014-10-21 LAB — POCT HEMOGLOBIN-HEMACUE: Hemoglobin: 13.7 g/dL (ref 12.0–15.0)

## 2014-10-21 SURGERY — MUSCLE BIOPSY
Anesthesia: General | Site: Thigh | Laterality: Left

## 2014-10-21 MED ORDER — HYDROCODONE-ACETAMINOPHEN 5-325 MG PO TABS: 1.0000 | ORAL_TABLET | ORAL | Status: DC | PRN

## 2014-10-21 MED ORDER — BUPIVACAINE-EPINEPHRINE 0.5% -1:200000 IJ SOLN
INTRAMUSCULAR | Status: DC | PRN
  Administered 2014-10-21: 10 mL

## 2014-10-21 MED ORDER — CEFAZOLIN SODIUM-DEXTROSE 2-3 GM-% IV SOLR
INTRAVENOUS | Status: AC
  Filled 2014-10-21: qty 50

## 2014-10-21 MED ORDER — PROPOFOL 10 MG/ML IV BOLUS
INTRAVENOUS | Status: DC | PRN
  Administered 2014-10-21: 140 mg via INTRAVENOUS

## 2014-10-21 MED ORDER — FENTANYL CITRATE (PF) 100 MCG/2ML IJ SOLN
INTRAMUSCULAR | Status: AC
  Filled 2014-10-21: qty 6

## 2014-10-21 MED ORDER — ONDANSETRON HCL 4 MG/2ML IJ SOLN
INTRAMUSCULAR | Status: DC | PRN
  Administered 2014-10-21: 4 mg via INTRAVENOUS

## 2014-10-21 MED ORDER — CEFAZOLIN SODIUM-DEXTROSE 2-3 GM-% IV SOLR
2.0000 g | INTRAVENOUS | Status: AC
  Administered 2014-10-21: 2 g via INTRAVENOUS
  Filled 2014-10-21: qty 50

## 2014-10-21 MED ORDER — FENTANYL CITRATE (PF) 100 MCG/2ML IJ SOLN
INTRAMUSCULAR | Status: DC | PRN
  Administered 2014-10-21: 50 ug via INTRAVENOUS

## 2014-10-21 MED ORDER — MIDAZOLAM HCL 2 MG/2ML IJ SOLN
INTRAMUSCULAR | Status: AC
  Filled 2014-10-21: qty 2

## 2014-10-21 MED ORDER — TRAMADOL HCL 50 MG PO TABS: 50.0000 mg | ORAL_TABLET | Freq: Four times a day (QID) | ORAL | Status: DC | PRN

## 2014-10-21 MED ORDER — FENTANYL CITRATE (PF) 100 MCG/2ML IJ SOLN
25.0000 ug | INTRAMUSCULAR | Status: DC | PRN
  Filled 2014-10-21: qty 1

## 2014-10-21 MED ORDER — LIDOCAINE HCL (CARDIAC) 20 MG/ML IV SOLN
INTRAVENOUS | Status: DC | PRN
  Administered 2014-10-21: 60 mg via INTRAVENOUS

## 2014-10-21 MED ORDER — LACTATED RINGERS IV SOLN
INTRAVENOUS | Status: DC
  Administered 2014-10-21: 07:00:00 via INTRAVENOUS
  Filled 2014-10-21: qty 1000

## 2014-10-21 MED ORDER — PROPOFOL 500 MG/50ML IV EMUL
INTRAVENOUS | Status: DC | PRN
  Administered 2014-10-21: 140 ug/kg/min via INTRAVENOUS

## 2014-10-21 MED ORDER — SODIUM CHLORIDE 0.9 % IR SOLN
Status: DC | PRN
  Administered 2014-10-21: 500 mL

## 2014-10-21 MED ORDER — DEXAMETHASONE SODIUM PHOSPHATE 4 MG/ML IJ SOLN
INTRAMUSCULAR | Status: DC | PRN
Start: 2014-10-21 — End: 2014-10-21
  Administered 2014-10-21: 8 mg via INTRAVENOUS

## 2014-10-21 SURGICAL SUPPLY — 53 items
APL SKNCLS STERI-STRIP NONHPOA (GAUZE/BANDAGES/DRESSINGS) ×1
BANDAGE ELASTIC 4 VELCRO ST LF (GAUZE/BANDAGES/DRESSINGS) ×1 IMPLANT
BENZOIN TINCTURE PRP APPL 2/3 (GAUZE/BANDAGES/DRESSINGS) ×1 IMPLANT
BLADE SURG 15 STRL LF DISP TIS (BLADE) ×1 IMPLANT
BLADE SURG 15 STRL SS (BLADE) ×2
BNDG COHESIVE 4X5 TAN NS LF (GAUZE/BANDAGES/DRESSINGS) IMPLANT
BNDG COHESIVE 4X5 TAN STRL (GAUZE/BANDAGES/DRESSINGS) IMPLANT
BNDG GAUZE ELAST 4 BULKY (GAUZE/BANDAGES/DRESSINGS) IMPLANT
CHLORAPREP W/TINT 26ML (MISCELLANEOUS) ×2 IMPLANT
CLEANER CAUTERY TIP 5X5 PAD (MISCELLANEOUS) IMPLANT
CLOTH BEACON ORANGE TIMEOUT ST (SAFETY) ×2 IMPLANT
COVER BACK TABLE 60X90IN (DRAPES) ×2 IMPLANT
COVER MAYO STAND STRL (DRAPES) ×2 IMPLANT
DECANTER SPIKE VIAL GLASS SM (MISCELLANEOUS) ×2 IMPLANT
DRAPE EXTREMITY T 121X128X90 (DRAPE) IMPLANT
DRAPE LG THREE QUARTER DISP (DRAPES) IMPLANT
DRAPE ORTHO SPLIT 77X108 STRL (DRAPES)
DRAPE PED LAPAROTOMY (DRAPES) IMPLANT
DRAPE SURG ORHT 6 SPLT 77X108 (DRAPES) IMPLANT
DRAPE U 20/CS (DRAPES) IMPLANT
DRAPE UTILITY XL STRL (DRAPES) ×2 IMPLANT
DRSG TEGADERM 2-3/8X2-3/4 SM (GAUZE/BANDAGES/DRESSINGS) IMPLANT
DRSG TEGADERM 4X4.75 (GAUZE/BANDAGES/DRESSINGS) IMPLANT
ELECT REM PT RETURN 9FT ADLT (ELECTROSURGICAL) ×2
ELECTRODE REM PT RTRN 9FT ADLT (ELECTROSURGICAL) ×1 IMPLANT
GLOVE BIO SURGEON STRL SZ 6.5 (GLOVE) ×1 IMPLANT
GLOVE BIOGEL PI IND STRL 6.5 (GLOVE) IMPLANT
GLOVE BIOGEL PI INDICATOR 6.5 (GLOVE) ×1
GLOVE SURG ORTHO 8.0 STRL STRW (GLOVE) ×2 IMPLANT
GOWN STRL REUS W/ TWL LRG LVL3 (GOWN DISPOSABLE) ×1 IMPLANT
GOWN STRL REUS W/TWL LRG LVL3 (GOWN DISPOSABLE) ×2
GOWN W/COTTON TOWEL STD LRG (GOWNS) ×2 IMPLANT
GOWN XL W/COTTON TOWEL STD (GOWNS) ×2 IMPLANT
MARKER SKIN DUAL TIP RULER LAB (MISCELLANEOUS) ×2 IMPLANT
NDL HYPO 25X1 1.5 SAFETY (NEEDLE) ×1 IMPLANT
NEEDLE HYPO 25X1 1.5 SAFETY (NEEDLE) ×2 IMPLANT
NS IRRIG 500ML POUR BTL (IV SOLUTION) ×2 IMPLANT
PACK BASIN DAY SURGERY FS (CUSTOM PROCEDURE TRAY) ×2 IMPLANT
PAD CLEANER CAUTERY TIP 5X5 (MISCELLANEOUS)
PENCIL BUTTON HOLSTER BLD 10FT (ELECTRODE) ×2 IMPLANT
SLEEVE SCD COMPRESS KNEE MED (MISCELLANEOUS) IMPLANT
SPONGE GAUZE 4X4 12PLY STER LF (GAUZE/BANDAGES/DRESSINGS) ×2 IMPLANT
STOCKINETTE 4X48 STRL (DRAPES) IMPLANT
STRIP CLOSURE SKIN 1/2X4 (GAUZE/BANDAGES/DRESSINGS) ×1 IMPLANT
SUT ETHILON 3 0 PS 1 (SUTURE) IMPLANT
SUT MNCRL AB 4-0 PS2 18 (SUTURE) ×1 IMPLANT
SUT VIC AB 2-0 SH 18 (SUTURE) ×1 IMPLANT
SUT VIC AB 3-0 SH 18 (SUTURE) IMPLANT
SUT VICRYL 3-0 CR8 SH (SUTURE) IMPLANT
SUT VICRYL 4-0 PS2 18IN ABS (SUTURE) IMPLANT
SYR CONTROL 10ML LL (SYRINGE) ×2 IMPLANT
TOWEL OR 17X24 6PK STRL BLUE (TOWEL DISPOSABLE) ×4 IMPLANT
WATER STERILE IRR 500ML POUR (IV SOLUTION) IMPLANT

## 2014-10-21 NOTE — Anesthesia Postprocedure Evaluation (Signed)
  Anesthesia Post-op Note  Patient: Crystal Lloyd  Procedure(s) Performed: Procedure(s) (LRB): LEFT THIGH MUSCLE BIOPSY (Left)  Patient Location: PACU  Anesthesia Type: General  Level of Consciousness: awake and alert   Airway and Oxygen Therapy: Patient Spontanous Breathing  Post-op Pain: mild  Post-op Assessment: Post-op Vital signs reviewed, Patient's Cardiovascular Status Stable, Respiratory Function Stable, Patent Airway and No signs of Nausea or vomiting  Last Vitals:  Filed Vitals:   10/21/14 0925  BP: 132/70  Pulse: 55  Temp: 36.4 C  Resp: 16    Post-op Vital Signs: stable   Complications: No apparent anesthesia complications

## 2014-10-21 NOTE — Interval H&P Note (Signed)
History and Physical Interval Note:  10/21/2014 7:26 AM  Crystal Lloyd  has presented today for surgery, with the diagnosis of ELEVATED CREATINE KINASE LEVEL, MUSCLE PAIN.  The various methods of treatment have been discussed with the patient and family. After consideration of risks, benefits and other options for treatment, the patient has consented to    Procedure(s): LEFT THIGH MUSCLE BIOPSY (Left) as a surgical intervention .    The patient's history has been reviewed, patient examined, no change in status, stable for surgery.  I have reviewed the patient's chart and labs.  Questions were answered to the patient's satisfaction.    Earnstine Regal, MD, [facility] Office: [place]

## 2014-10-21 NOTE — Discharge Instructions (Signed)

## 2014-10-21 NOTE — Brief Op Note (Signed)
10/21/2014  8:09 AM  PATIENT:  Crystal Lloyd  69 y.o. female  PRE-OPERATIVE DIAGNOSIS:  ELEVATED CREATINE KINASE LEVEL, MUSCLE PAIN  POST-OPERATIVE DIAGNOSIS:  ELEVATED CREATINE KINASE LEVEL, MUSCLE PAIN  PROCEDURE:  Procedure(s): LEFT THIGH MUSCLE BIOPSY (Left)  SURGEON:  Surgeon(s) and Role:    * Armandina Gemma, MD - Primary  ANESTHESIA:   general  EBL:  Total I/O In: 100 [I.V.:100] Out: -   BLOOD ADMINISTERED:none  DRAINS: none   LOCAL MEDICATIONS USED:  MARCAINE     SPECIMEN:  Excision  DISPOSITION OF SPECIMEN:  PATHOLOGY  COUNTS:  YES  TOURNIQUET:  * No tourniquets in log *  DICTATION: .Other Dictation: Dictation Number [id number]  PLAN OF CARE: Discharge to home after PACU  PATIENT DISPOSITION:  PACU - hemodynamically stable.   Delay start of Pharmacological VTE agent (>24hrs) due to surgical blood loss or risk of bleeding: yes  Earnstine Regal, MD, [facility] Office: [phone]

## 2014-10-21 NOTE — Anesthesia Procedure Notes (Signed)
Procedure Name: LMA Insertion Date/Time: 10/21/2014 7:35 AM Performed by: Denna Haggard D Pre-anesthesia Checklist: Patient identified, Emergency Drugs available, Suction available and Patient being monitored Patient Re-evaluated:Patient Re-evaluated prior to inductionOxygen Delivery Method: Circle System Utilized Preoxygenation: Pre-oxygenation with 100% oxygen Intubation Type: IV induction Ventilation: Mask ventilation without difficulty LMA: LMA inserted LMA Size: 4.0 Number of attempts: 1 Airway Equipment and Method: Bite block Placement Confirmation: positive ETCO2 Tube secured with: Tape Dental Injury: Teeth and Oropharynx as per pre-operative assessment

## 2014-10-21 NOTE — Transfer of Care (Signed)
Immediate Anesthesia Transfer of Care Note  Patient: Crystal Lloyd  Procedure(s) Performed: Procedure(s) (LRB): LEFT THIGH MUSCLE BIOPSY (Left)  Patient Location: PACU  Anesthesia Type: General  Level of Consciousness: awake, oriented, sedated and patient cooperative  Airway & Oxygen Therapy: Patient Spontanous Breathing and Patient connected to face mask oxygen  Post-op Assessment: Report given to PACU RN and Post -op Vital signs reviewed and stable  Post vital signs: Reviewed and stable  Complications: No apparent anesthesia complications

## 2014-10-22 ENCOUNTER — Encounter (HOSPITAL_BASED_OUTPATIENT_CLINIC_OR_DEPARTMENT_OTHER): Payer: Self-pay | Admitting: Surgery

## 2014-10-22 NOTE — Op Note (Signed)
Crystal Lloyd, BATSON NO.:  [id number]  MEDICAL RECORD NO.:  [id number]  LOCATION:                               FACILITY:  [facility]  PHYSICIAN:  Earnstine Regal, MD      DATE OF BIRTH:  [birth year]  DATE OF PROCEDURE:  10/21/2014                              OPERATIVE REPORT   PREOPERATIVE DIAGNOSIS:  Elevated CK level, muscle pain.  POSTOPERATIVE DIAGNOSIS:  Elevated CK level, muscle pain.  PROCEDURE:  Left vastus medialis excisional muscle biopsy.  SURGEON:  Earnstine Regal, MD, FACS  ANESTHESIA:  General per Dr. Franne Grip.  ESTIMATED BLOOD LOSS:  Minimal.  PREPARATION:  ChloraPrep.  COMPLICATIONS:  None.  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  She now comes to surgery for muscle biopsy at the request of her neurologist.  BODY OF REPORT:  Procedure was done in OR #1 at the [facility].  The patient was brought to the operating room, placed in supine position on the operating room table. Following administration of general anesthesia, the patient was positioned and then prepped and draped in usual aseptic fashion.  After ascertaining that an adequate level of anesthesia had been achieved, a 3- cm incision was made longitudinally over the head of the vastus medialis muscle.  Dissection was carried through subcutaneous tissues and hemostasis achieved with the electrocautery.  Fascia was incised in line of its fibers.  Underlying muscle appears healthy.  Two muscle bundles were sharply excised with the Metzenbaum scissors and wrapped in saline moistened gauze and submitted fresh to Pathology for review.  Good hemostasis was achieved with the electrocautery.  Muscle fascia was closed with interrupted 2-0 Vicryl figure-of-eight sutures. Subcutaneous tissues were closed with interrupted 2-0 Vicryl sutures. Local Marcaine anesthetic was  infiltrated throughout the operative site. Skin edges were reapproximated with a running 4-0 Monocryl subcuticular suture. Wound was washed and dried and benzoin and Steri-Strips were applied. Sterile dressings were applied.  The patient was awakened from anesthesia and brought to the recovery room in stable condition.  The patient tolerated the procedure well.   Earnstine Regal, MD, [facility] Office: [phone]    TMG/MEDQ  D:  10/21/2014  T:  10/22/2014  Job:  [id number]  cc:   Estanislado Pandy, Dr.  Ishmael Holter. Forde Dandy, M.D. Fax: [phone]  Edison Nasuti, M.D. Fax: [phone]

## 2014-10-24 ENCOUNTER — Encounter (HOSPITAL_BASED_OUTPATIENT_CLINIC_OR_DEPARTMENT_OTHER): Payer: Self-pay | Admitting: Surgery

## 2014-11-11 ENCOUNTER — Encounter (HOSPITAL_COMMUNITY): Payer: Self-pay

## 2014-12-01 ENCOUNTER — Telehealth: Payer: Self-pay | Admitting: Urgent Care

## 2014-12-01 NOTE — Telephone Encounter (Signed)
-----   Message from Darlyne Russian, MD sent at 12/01/2014 10:23 AM EST ----- Regarding: RE: Correspondence  Can we go ahead and contact the patient to see me some time at 102 and discuss medication changes. ----- Message -----    From: Jaynee Eagles, PA-C    Sent: 11/20/2014   2:47 PM      To: Darlyne Russian, MD Subject: Correspondence                                 Patient was seen by Frederik Pear (Rheumatology) on 11/13/2014 for morning stiffness, muscle fatigue. Her workup was negative for myositis. EMG with muscle biopsy showed mild neurogenic atrophy. Recommendation was to consider switching to pravastatin or fluvastatin to avoid elevated CK. Also recommended continuing coenzyme Q10. Patient will try to see Dr. Vertell Limber for neurosurgery consult. Plan is to follow up with Dr. Forde Dandy regarding multiple diagnosis including hx breast cancer, hearing loss, SCC, colonic polyps, osteopenia, migraines, etc.

## 2014-12-01 NOTE — Telephone Encounter (Signed)
Patient has an appointment with her internal med doc, Dr. Forde Dandy, on 12/22/2014. She plans on getting evaluated with her internist since she was started on her statin by him. She is grateful however, that you wanted to follow up with her, Dr. Everlene Farrier.

## 2015-05-20 DIAGNOSIS — M4806 Spinal stenosis, lumbar region: Secondary | ICD-10-CM | POA: Diagnosis not present

## 2015-05-20 DIAGNOSIS — M5416 Radiculopathy, lumbar region: Secondary | ICD-10-CM | POA: Diagnosis not present

## 2015-05-20 DIAGNOSIS — I10 Essential (primary) hypertension: Secondary | ICD-10-CM | POA: Diagnosis not present

## 2015-05-20 DIAGNOSIS — M4120 Other idiopathic scoliosis, site unspecified: Secondary | ICD-10-CM | POA: Diagnosis not present

## 2015-05-29 DIAGNOSIS — M85851 Other specified disorders of bone density and structure, right thigh: Secondary | ICD-10-CM | POA: Diagnosis not present

## 2015-05-29 DIAGNOSIS — M85852 Other specified disorders of bone density and structure, left thigh: Secondary | ICD-10-CM | POA: Diagnosis not present

## 2015-05-29 DIAGNOSIS — M85832 Other specified disorders of bone density and structure, left forearm: Secondary | ICD-10-CM | POA: Diagnosis not present

## 2015-06-05 DIAGNOSIS — H04123 Dry eye syndrome of bilateral lacrimal glands: Secondary | ICD-10-CM | POA: Diagnosis not present

## 2015-07-20 DIAGNOSIS — C210 Malignant neoplasm of anus, unspecified: Secondary | ICD-10-CM | POA: Diagnosis not present

## 2015-07-20 DIAGNOSIS — R748 Abnormal levels of other serum enzymes: Secondary | ICD-10-CM | POA: Diagnosis not present

## 2015-07-20 DIAGNOSIS — M4806 Spinal stenosis, lumbar region: Secondary | ICD-10-CM | POA: Diagnosis not present

## 2015-07-20 DIAGNOSIS — R7301 Impaired fasting glucose: Secondary | ICD-10-CM | POA: Diagnosis not present

## 2015-07-20 DIAGNOSIS — G454 Transient global amnesia: Secondary | ICD-10-CM | POA: Diagnosis not present

## 2015-07-20 DIAGNOSIS — E785 Hyperlipidemia, unspecified: Secondary | ICD-10-CM | POA: Diagnosis not present

## 2015-07-20 DIAGNOSIS — E784 Other hyperlipidemia: Secondary | ICD-10-CM | POA: Diagnosis not present

## 2015-07-20 DIAGNOSIS — C50919 Malignant neoplasm of unspecified site of unspecified female breast: Secondary | ICD-10-CM | POA: Diagnosis not present

## 2015-07-20 DIAGNOSIS — E559 Vitamin D deficiency, unspecified: Secondary | ICD-10-CM | POA: Diagnosis not present

## 2015-07-20 DIAGNOSIS — E038 Other specified hypothyroidism: Secondary | ICD-10-CM | POA: Diagnosis not present

## 2015-07-20 DIAGNOSIS — I779 Disorder of arteries and arterioles, unspecified: Secondary | ICD-10-CM | POA: Diagnosis not present

## 2015-09-02 DIAGNOSIS — Z4789 Encounter for other orthopedic aftercare: Secondary | ICD-10-CM | POA: Diagnosis not present

## 2015-09-02 DIAGNOSIS — M5137 Other intervertebral disc degeneration, lumbosacral region: Secondary | ICD-10-CM | POA: Diagnosis not present

## 2015-09-02 DIAGNOSIS — M4806 Spinal stenosis, lumbar region: Secondary | ICD-10-CM | POA: Diagnosis not present

## 2015-09-02 DIAGNOSIS — Z9889 Other specified postprocedural states: Secondary | ICD-10-CM | POA: Diagnosis not present

## 2015-09-02 DIAGNOSIS — Z981 Arthrodesis status: Secondary | ICD-10-CM | POA: Diagnosis not present

## 2015-09-02 DIAGNOSIS — Z885 Allergy status to narcotic agent status: Secondary | ICD-10-CM | POA: Diagnosis not present

## 2015-09-02 DIAGNOSIS — R200 Anesthesia of skin: Secondary | ICD-10-CM | POA: Diagnosis not present

## 2015-09-02 DIAGNOSIS — R531 Weakness: Secondary | ICD-10-CM | POA: Diagnosis not present

## 2015-09-02 DIAGNOSIS — M79605 Pain in left leg: Secondary | ICD-10-CM | POA: Diagnosis not present

## 2015-09-08 ENCOUNTER — Emergency Department (HOSPITAL_COMMUNITY)
Admission: EM | Admit: 2015-09-08 | Discharge: 2015-09-08 | Disposition: A | Discharge status: Home / Self Care | Payer: Medicare Other | Attending: Emergency Medicine | Admitting: Emergency Medicine

## 2015-09-08 ENCOUNTER — Emergency Department (HOSPITAL_COMMUNITY): Payer: Medicare Other

## 2015-09-08 ENCOUNTER — Encounter (HOSPITAL_COMMUNITY): Payer: Self-pay | Admitting: *Deleted

## 2015-09-08 DIAGNOSIS — M25512 Pain in left shoulder: Secondary | ICD-10-CM | POA: Diagnosis not present

## 2015-09-08 DIAGNOSIS — H833X2 Noise effects on left inner ear: Secondary | ICD-10-CM | POA: Diagnosis not present

## 2015-09-08 DIAGNOSIS — E039 Hypothyroidism, unspecified: Secondary | ICD-10-CM | POA: Insufficient documentation

## 2015-09-08 DIAGNOSIS — H903 Sensorineural hearing loss, bilateral: Secondary | ICD-10-CM | POA: Diagnosis not present

## 2015-09-08 DIAGNOSIS — S40012A Contusion of left shoulder, initial encounter: Secondary | ICD-10-CM | POA: Insufficient documentation

## 2015-09-08 DIAGNOSIS — Y999 Unspecified external cause status: Secondary | ICD-10-CM | POA: Insufficient documentation

## 2015-09-08 DIAGNOSIS — Z7982 Long term (current) use of aspirin: Secondary | ICD-10-CM | POA: Diagnosis not present

## 2015-09-08 DIAGNOSIS — G912 (Idiopathic) normal pressure hydrocephalus: Secondary | ICD-10-CM | POA: Diagnosis not present

## 2015-09-08 DIAGNOSIS — Y92410 Unspecified street and highway as the place of occurrence of the external cause: Secondary | ICD-10-CM | POA: Insufficient documentation

## 2015-09-08 DIAGNOSIS — Z853 Personal history of malignant neoplasm of breast: Secondary | ICD-10-CM | POA: Insufficient documentation

## 2015-09-08 DIAGNOSIS — Y9389 Activity, other specified: Secondary | ICD-10-CM | POA: Insufficient documentation

## 2015-09-08 DIAGNOSIS — Z79899 Other long term (current) drug therapy: Secondary | ICD-10-CM | POA: Diagnosis not present

## 2015-09-08 DIAGNOSIS — S4992XA Unspecified injury of left shoulder and upper arm, initial encounter: Secondary | ICD-10-CM | POA: Diagnosis not present

## 2015-09-08 DIAGNOSIS — T148 Other injury of unspecified body region: Secondary | ICD-10-CM | POA: Diagnosis not present

## 2015-09-08 MED ORDER — IBUPROFEN 200 MG PO TABS
400.0000 mg | ORAL_TABLET | Freq: Once | ORAL | Status: AC
  Administered 2015-09-08: 400 mg via ORAL
  Filled 2015-09-08: qty 2

## 2015-09-08 NOTE — Discharge Instructions (Signed)
Return if any problems.
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
## 2015-09-08 NOTE — ED Notes (Signed)
PT DISCHARGED. INSTRUCTIONS GIVEN. AAOX4. PT IN NO APPARENT DISTRESS. THE OPPORTUNITY TO ASK QUESTIONS WAS PROVIDED. 
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
## 2015-09-10 DIAGNOSIS — H903 Sensorineural hearing loss, bilateral: Secondary | ICD-10-CM | POA: Diagnosis not present

## 2015-09-10 DIAGNOSIS — G912 (Idiopathic) normal pressure hydrocephalus: Secondary | ICD-10-CM | POA: Diagnosis not present

## 2015-09-10 DIAGNOSIS — H833X2 Noise effects on left inner ear: Secondary | ICD-10-CM | POA: Diagnosis not present

## 2015-09-22 DIAGNOSIS — G912 (Idiopathic) normal pressure hydrocephalus: Secondary | ICD-10-CM | POA: Diagnosis not present

## 2015-09-22 DIAGNOSIS — H903 Sensorineural hearing loss, bilateral: Secondary | ICD-10-CM | POA: Diagnosis not present

## 2015-09-22 DIAGNOSIS — H833X2 Noise effects on left inner ear: Secondary | ICD-10-CM | POA: Diagnosis not present

## 2015-10-10 DIAGNOSIS — Z23 Encounter for immunization: Secondary | ICD-10-CM | POA: Diagnosis not present

## 2016-01-05 ENCOUNTER — Ambulatory Visit (INDEPENDENT_AMBULATORY_CARE_PROVIDER_SITE_OTHER): Payer: Medicare Other | Admitting: Obstetrics & Gynecology

## 2016-01-05 ENCOUNTER — Encounter: Payer: Self-pay | Admitting: Obstetrics & Gynecology

## 2016-01-05 VITALS — BP 112/80 | HR 86 | Resp 86 | Ht 59.5 in | Wt 116.0 lb

## 2016-01-05 DIAGNOSIS — Z124 Encounter for screening for malignant neoplasm of cervix: Secondary | ICD-10-CM | POA: Diagnosis not present

## 2016-01-05 DIAGNOSIS — D013 Carcinoma in situ of anus and anal canal: Secondary | ICD-10-CM | POA: Diagnosis not present

## 2016-01-05 DIAGNOSIS — K6282 Dysplasia of anus: Secondary | ICD-10-CM | POA: Diagnosis not present

## 2016-01-05 DIAGNOSIS — D4959 Neoplasm of unspecified behavior of other genitourinary organ: Secondary | ICD-10-CM

## 2016-01-05 DIAGNOSIS — Z01419 Encounter for gynecological examination (general) (routine) without abnormal findings: Secondary | ICD-10-CM

## 2016-01-05 NOTE — Progress Notes (Signed)
70 y.o. [id number] MarriedCaucasianF here for annual exam/new pt visit.  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 Morton Stall at [facility] for five years.  Reports polyp was found on colonoscopy and AIN noted.  She then saw Dr. Morton Stall who did acetic acid staining.  There was some remaining abnormality so wider excision was performed.  She has proctoscopy and acetic acid staning for the five years and never had another finding on exam.  Colonoscopy follow up is done with Dr. Earlean Shawl (who biopsied the initial lesion.).  Now, having colonoscopy every three years.    Overall, doing well.  Very involved with two grandchildren.  Oldest son moved to [place].  Married.  He and wife are considering donor egg.  She is 45.  Questions regarding REIs in [place].  Answered questions to best of my ability.  Denies vaginal bleeding or discharge.  Does have atrophic symptoms.  Not SA any longer.      PCP:  Dr. Forde Dandy.  On injection for cholesterol medication.  Oral agents cause elevated CK.  She has tried "many" cholesterol lower agents.  Reports Dr. Forde Dandy has been "very good" to work with her so much regarding this.  Patient's last menstrual period was 04/24/1997.          Sexually active: No.  The current method of family planning is post menopausal status.    Exercising: Yes.    walking Smoker:  no  Health Maintenance: Pap:  ~2013 Normal  History of abnormal Pap:  yes MMG:  2002 Double Mastectomy  Colonoscopy:  2015 Polyps- Dr. Earlean Shawl.  Due next year. BMD:   2017 Osteopenia  TDaP: will do this today Pneumonia vaccine(s):  Done Zostavax:   Done Hep C testing: Done  Screening Labs: PCP   reports that she has never smoked. She has never used smokeless tobacco. She reports that she drinks about 0.6 oz of alcohol per week . She reports that she does not use drugs.  Past Medical History:  Diagnosis Date   . Diverticulosis of colon   . Elevated creatine kinase level   . History of anal dysplasia    AIN III  . History of breast cancer    2000--  s/p  bilateral mastectomies, chemo and radiation therapy's//  no recurrence  . History of diverticulitis of colon    hx of a few times w/ only one requiring surgical intervention due to perfation in 2011  . History of scarlet fever   . Hypothyroidism   . Muscle pain   . Osteopenia   . PONV (postoperative nausea and vomiting)   . Wears glasses   . Wears hearing aid    bilateral    Past Surgical History:  Procedure Laterality Date  . APPENDECTOMY  age 67  . ARTHROSCOPIC REPAIR ACL Left 1989   Tendon Graft   . CARDIAC CATHETERIZATION  05-07-2003  dr Sabino Snipes   normal coronary arteries and LVF, ef 70%  . CARDIOVASCULAR STRESS TEST  02-03-2003  dr Martinique   prominent apical thinning but no convinceing evidence for ischemia or infarct otherwise normal perfusion study/  ef 66%  . COLONOSCOPY  last one 2015  . COLOSTOMY TAKEDOWN  11-24-2009  . EXPLORATORY LAPAROTOMY/  SIGMOID COLECTOMY/ HARTMAN POUCH/ DESCENDING COLECTOMY  08-21-2009   diverticular perforated sigmoid colon w/ fecal peritonitis  . LAPAROSCOPIC BILATERAL SALPINGO OOPHERECTOMY  01-07-2003  .  LEFT BREAST LUMPECTOMY  1999  . LUMBAR FUSION  08-22-2013   at [facility]  . MASTECTOMY Bilateral 2002  . MUSCLE BIOPSY Left 10/21/2014   Procedure: LEFT THIGH MUSCLE BIOPSY;  Surgeon: Armandina Gemma, MD;  Location: [facility];  Service: General;  Laterality: Left;  . REMOVAL ANORECTAL POLYP  08/ 2008   [facility]  . TONSILLECTOMY  age 22  . TRANSTHORACIC ECHOCARDIOGRAM  01-25-2013   grade I diastolic dysfuntion, ef [id number]  mild MR/  trivial TR    Current Outpatient Prescriptions  Medication Sig Dispense Refill  . acetaminophen (TYLENOL) 325 MG tablet Take 650 mg by mouth every 6 (six) hours as needed.    Marland Kitchen ADVAIR DISKUS 100-50 MCG/DOSE AEPB daily as needed.  4  . ALPRAZolam  (XANAX) 0.5 MG tablet Take 0.25 mg by mouth at bedtime as needed for anxiety.     Marland Kitchen aspirin EC 81 MG EC tablet Take 1 tablet (81 mg total) by mouth daily.    . Coenzyme Q10 (CO Q 10) 100 MG CAPS Take 200 mg by mouth daily.     . ergocalciferol (VITAMIN D2) 50000 UNITS capsule Take 50,000 Units by mouth once a week. Every Sunday    . levothyroxine (SYNTHROID, LEVOTHROID) 75 MCG tablet Take 75 mcg by mouth daily before breakfast.     . Multiple Vitamin (MULTIVITAMIN) tablet Take 1 tablet by mouth daily.    . Omega-3 Fatty Acids (FISH OIL PO) Take 1 g by mouth daily.     . polyethylene glycol (MIRALAX / GLYCOLAX) packet Take 17 g by mouth daily.    Marland Kitchen PRALUENT 150 MG/ML SOPN Every 2 weeks  11  . Riboflavin (B-2-400 PO) Take 1 mg by mouth daily.      No current facility-administered medications for this visit.     Family History  Problem Relation Age of Onset  . Asthma Mother   . Emphysema Mother   . Hypertension Mother   . Diverticulitis Mother   . Hypertension Brother   . Hypertension Brother   . Cervical cancer Maternal Grandmother     ROS:  Pertinent items are noted in HPI.  Otherwise, a comprehensive ROS was negative.  Exam:   BP 112/80 (BP Location: Right Arm, Patient Position: Sitting, Cuff Size: Normal)   Pulse 86   Resp (!) 86   Ht 4' 11.5" (1.511 m)   Wt 116 lb (52.6 kg)   LMP 04/24/1997   BMI 23.04 kg/m     Height: 4' 11.5" (151.1 cm)  Ht Readings from Last 3 Encounters:  01/05/16 4' 11.5" (1.511 m)  09/08/15 5' (1.524 m)  10/15/14 5' (1.524 m)   General appearance: alert, cooperative and appears stated age Head: Normocephalic, without obvious abnormality, atraumatic Neck: no adenopathy, supple, symmetrical, trachea midline and thyroid normal to inspection and palpation Lungs: clear to auscultation bilaterally Breasts: surgically absent without implants or reconstruction.  No masses, skin lesions, axillary LAD noted Heart: regular rate and rhythm Abdomen: soft,  non-tender; bowel sounds normal; no masses,  no organomegaly Extremities: extremities normal, atraumatic, no cyanosis or edema Skin: Skin color, texture, turgor normal. No rashes or lesions Lymph nodes: Cervical, supraclavicular, and axillary nodes normal. No abnormal inguinal nodes palpated Neurologic: Grossly normal  Pelvic: External genitalia:  Pigmented lesion that is raised and verrucoid in appearance noted on perineal body to left and inferiorly from vagina (pt not aware this was present)  Urethra:  normal appearing urethra with no masses, tenderness or lesions              Bartholins and Skenes: normal                 Vagina: normal appearing vagina with normal color and discharge, no lesions              Cervix: no lesions              Pap taken: Yes.   Bimanual Exam:  Uterus:  normal size, contour, position, consistency, mobility, non-tender              Adnexa: no mass, fullness, tenderness               Rectovaginal: Confirms               Anus:  normal sphincter tone, no lesions  Due to AIN hx and pt not being aware of lesion, d/w pt removal.  She is agreement.  Verbal consent obtained.  Area cleansed with Betadine x 3.  0.4 cc 1% Lidocaine instilled beneath lesion.  Lesion removed with sterile technique and completely.  Silver nitrate used for excellent hemostasis.  Pt tolerated procedure well.  No dressing applied.  Care instructions reviewed.  Chaperone was present for exam.  A:  Well Woman with normal exam H/O breast cancer.  S/p bilateral mastectomy. H/O colectomy, then colostomy takedown due to diverticular H/O BSO 2004 by Dr. Fermin Schwab H/O hyperlipidemia with elevated CK on oral statins.  Followed by Dr. Forde Dandy H/O of transient global amnesia that Dr. Forde Dandy felt was possibly TIA.  Pt on 81mg  ASA as a result.   Hypothyroidism  P:   Mammograms not indicated pap smear and HR HPV obtained due to hx and lack of pap smear over last few years Vulvar lesion  will be sent the pathology and results called to pt. Pt desires Tdap update today return annually or prn

## 2016-01-06 DIAGNOSIS — L919 Hypertrophic disorder of the skin, unspecified: Secondary | ICD-10-CM | POA: Diagnosis not present

## 2016-01-06 DIAGNOSIS — D013 Carcinoma in situ of anus and anal canal: Secondary | ICD-10-CM | POA: Insufficient documentation

## 2016-01-06 DIAGNOSIS — D4959 Neoplasm of unspecified behavior of other genitourinary organ: Secondary | ICD-10-CM | POA: Diagnosis not present

## 2016-01-06 LAB — PAP IG AND HPV HIGH-RISK: HPV DNA High Risk: NOT DETECTED

## 2016-01-07 LAB — PATHOLOGY

## 2016-01-14 ENCOUNTER — Encounter: Payer: Self-pay | Admitting: Obstetrics & Gynecology

## 2016-01-27 ENCOUNTER — Telehealth: Payer: Self-pay

## 2016-01-27 NOTE — Telephone Encounter (Signed)
Patient is calling to find out which tetanus shot she received.  The vaccine would have been administered a few years back (might be DOS 08/03/12).  Please advise  [phone]

## 2016-01-30 NOTE — Telephone Encounter (Signed)
tdap 02/16/10, pt advised

## 2016-02-01 DIAGNOSIS — E559 Vitamin D deficiency, unspecified: Secondary | ICD-10-CM | POA: Diagnosis not present

## 2016-02-01 DIAGNOSIS — E784 Other hyperlipidemia: Secondary | ICD-10-CM | POA: Diagnosis not present

## 2016-02-01 DIAGNOSIS — E038 Other specified hypothyroidism: Secondary | ICD-10-CM | POA: Diagnosis not present

## 2016-02-01 DIAGNOSIS — E785 Hyperlipidemia, unspecified: Secondary | ICD-10-CM | POA: Diagnosis not present

## 2016-02-01 DIAGNOSIS — I779 Disorder of arteries and arterioles, unspecified: Secondary | ICD-10-CM | POA: Diagnosis not present

## 2016-02-01 DIAGNOSIS — R7301 Impaired fasting glucose: Secondary | ICD-10-CM | POA: Diagnosis not present

## 2016-02-01 DIAGNOSIS — C210 Malignant neoplasm of anus, unspecified: Secondary | ICD-10-CM | POA: Diagnosis not present

## 2016-02-01 DIAGNOSIS — Z6822 Body mass index (BMI) 22.0-22.9, adult: Secondary | ICD-10-CM | POA: Diagnosis not present

## 2016-02-01 DIAGNOSIS — M859 Disorder of bone density and structure, unspecified: Secondary | ICD-10-CM | POA: Diagnosis not present

## 2016-02-01 DIAGNOSIS — C50919 Malignant neoplasm of unspecified site of unspecified female breast: Secondary | ICD-10-CM | POA: Diagnosis not present

## 2016-02-01 DIAGNOSIS — Z79899 Other long term (current) drug therapy: Secondary | ICD-10-CM | POA: Diagnosis not present

## 2016-03-15 ENCOUNTER — Other Ambulatory Visit: Payer: Medicare Other

## 2016-03-15 ENCOUNTER — Ambulatory Visit (HOSPITAL_BASED_OUTPATIENT_CLINIC_OR_DEPARTMENT_OTHER): Payer: Medicare Other

## 2016-03-15 DIAGNOSIS — Z315 Encounter for genetic counseling: Secondary | ICD-10-CM | POA: Diagnosis not present

## 2016-03-15 DIAGNOSIS — Z803 Family history of malignant neoplasm of breast: Secondary | ICD-10-CM | POA: Diagnosis not present

## 2016-03-15 DIAGNOSIS — Z85048 Personal history of other malignant neoplasm of rectum, rectosigmoid junction, and anus: Secondary | ICD-10-CM

## 2016-03-15 DIAGNOSIS — Z853 Personal history of malignant neoplasm of breast: Secondary | ICD-10-CM

## 2016-03-15 NOTE — Progress Notes (Signed)
REFERRING PROVIDER: Reynold Bowen, MD [address] [place], [place] [address]  PRIMARY PROVIDER:  Sheela Stack, MD  PRIMARY REASON FOR VISIT:  1. Personal history of malignant neoplasm of breast   2. Family history of malignant neoplasm of breast    HISTORY OF PRESENT ILLNESS:   Crystal Lloyd, a 71 y.o. female, was seen for a [place] cancer genetics consultation at the request of Dr. Forde Dandy due to a personal and family history of cancer.  Crystal Lloyd presents to clinic today to discuss the possibility of a hereditary predisposition to cancer, genetic testing, and to further clarify her future cancer risks, as well as potential cancer risks for family members.   In January 1999, at the age of 59, Crystal Lloyd was diagnosed with invasive lobular cancer of the breast. This was treated with lumpectomy, chemotherapy and radiation. She decided to have bilateral mastectomies 3 years later as well as bilateral salpingo-oophorectomy. Her uterus remains intact.   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 The area was re-excised and she was followed closely for 5 years after. The currently has colonoscopies every 2 years.  CANCER HISTORY:   No history exists.   HORMONAL RISK FACTORS:  Ovaries intact: no.  Hysterectomy: yes, partial. Uterus intact.  Menopausal status: postmenopausal.  HRT use: not assessed . Colonoscopy: yes; abnormal. Mammogram within the last year: n/a. Number of breast biopsies: not assessed. Up to date with pelvic exams:  Not assessed. Any excessive radiation exposure in the past:  Not assessed  Past Medical History:  Diagnosis Date  . Diverticulosis of colon   . Elevated creatine kinase level   . History of anal dysplasia    AIN III  . History of breast cancer    2000--  s/p  bilateral mastectomies, chemo and radiation therapy's//  no recurrence  . History of diverticulitis  of colon    hx of a few times w/ only one requiring surgical intervention due to perfation in 2011  . History of scarlet fever   . Hypothyroidism   . Muscle pain   . Osteopenia   . PONV (postoperative nausea and vomiting)   . Wears glasses   . Wears hearing aid    bilateral    Past Surgical History:  Procedure Laterality Date  . APPENDECTOMY  age 12  . ARTHROSCOPIC REPAIR ACL Left 1989   Tendon Graft   . CARDIAC CATHETERIZATION  05-07-2003  dr Sabino Snipes   normal coronary arteries and LVF, ef 70%  . CARDIOVASCULAR STRESS TEST  02-03-2003  dr Martinique   prominent apical thinning but no convinceing evidence for ischemia or infarct otherwise normal perfusion study/  ef 66%  . COLONOSCOPY  last one 2015  . COLOSTOMY TAKEDOWN  11-24-2009  . EXPLORATORY LAPAROTOMY/  SIGMOID COLECTOMY/ HARTMAN POUCH/ DESCENDING COLECTOMY  08-21-2009   diverticular perforated sigmoid colon w/ fecal peritonitis  . LAPAROSCOPIC BILATERAL SALPINGO OOPHERECTOMY  01-07-2003  . LEFT BREAST LUMPECTOMY  1999  . LUMBAR FUSION  08-22-2013   at [facility]  . MASTECTOMY Bilateral 2002  . MUSCLE BIOPSY Left 10/21/2014   Procedure: LEFT THIGH MUSCLE BIOPSY;  Surgeon: Armandina Gemma, MD;  Location: [facility];  Service: General;  Laterality: Left;  . REMOVAL ANORECTAL POLYP  08/ 2008   [facility]   Dr. Morton Stall  . TONSILLECTOMY  age 62  . TRANSTHORACIC ECHOCARDIOGRAM  01-25-2013   grade I diastolic  dysfuntion, ef 55-60%/  mild MR/  trivial TR    Social History   Social History  . Marital status: Married    Spouse name: N/A  . Number of children: N/A  . Years of education: N/A   Social History Main Topics  . Smoking status: Never Smoker  . Smokeless tobacco: Never Used  . Alcohol use 0.6 oz/week    1 Glasses of wine per week  . Drug use: No  . Sexual activity: Not Currently    Birth control/ protection: Post-menopausal   Other Topics Concern  . Not on file   Social History Narrative  . No  narrative on file     FAMILY HISTORY:  We obtained a detailed, 4-generation family history.  Significant diagnoses are listed below: Family History  Problem Relation Age of Onset  . Asthma Mother   . Emphysema Mother   . Hypertension Mother   . Diverticulitis Mother   . Hypertension Brother   . Hypertension Brother   . Cervical cancer Maternal Grandmother   . Cancer Other 41    breast    Crystal Lloyd is unaware of previous family history of genetic testing for hereditary cancer risks. Patient's maternal ancestors are of [place], [place] and [place] descent, and paternal ancestors are of Education administrator European descent. There is reported Ashkenazi Jewish ancestry on her father's side. She reports that her daughter recently did ancestry genetic testing that confirmed the Ashkenazi Jewish ancestry. There is no known consanguinity.  GENETIC COUNSELING ASSESSMENT: Crystal Lloyd is a 71 y.o. female with a personal and family history of breast cancer which is somewhat suggestive of a hereditary predisposition to cancer. We, therefore, discussed and recommended the following at today's visit.   DISCUSSION: We reviewed the characteristics, features and inheritance patterns of hereditary cancer syndromes. We also discussed genetic testing, including the appropriate family members to test, the process of testing, insurance coverage and turn-around-time for results. We discussed the implications of a negative, positive and/or variant of uncertain significant result. We recommended Crystal Lloyd pursue genetic testing for the Common Hereditary Cancers gene panel.   Based on Crystal Lloyd's personal and family history of cancer, she meets medical criteria for genetic testing. Despite that she meets criteria, she may still have an out of pocket cost. We discussed that if her out of pocket cost for testing is over $100, the laboratory will call and confirm whether she wants to proceed with testing.  If the  out of pocket cost of testing is less than $100 she will be billed by the genetic testing laboratory.   PLAN: After considering the risks, benefits, and limitations, Crystal Lloyd  provided informed consent to pursue genetic testing and the blood sample was sent to [facility] for analysis of the Common Hereditary Cancer Gene panel. Results should be available within approximately 2-3 weeks' time, at which point they will be disclosed by telephone to Crystal Lloyd, as will any additional recommendations warranted by these results. Crystal Lloyd will receive a summary of her genetic counseling visit and a copy of her results once available. This information will also be available in Epic. We encouraged Crystal Lloyd to remain in contact with cancer genetics annually so that we can continuously update the family history and inform her of any changes in cancer genetics and testing that may be of benefit for her family. Crystal Lloyd questions were answered to her satisfaction today. Our contact information was provided should additional questions or concerns arise.  Lastly, we encouraged Crystal Lloyd to remain in contact with cancer genetics annually so that we can continuously update the family history and inform her of any changes in cancer genetics and testing that may be of benefit for this family.   Ms.  Lloyd questions were answered to her satisfaction today. Our contact information was provided should additional questions or concerns arise. Thank you for the referral and allowing [place] to share in the care of your patient.   Benay Pike, MS, [facility] Certified Genetic Counselor  The patient was seen for a total of 55 minutes in face-to-face genetic counseling.  This patient was discussed with Drs. Magrinat, Lindi Adie and/or Burr Medico who agrees with the above.    _______________________________________________________________________ For Office Staff:  Number of people involved in session:  2 Was an Intern/ student involved with case: no

## 2016-03-23 ENCOUNTER — Ambulatory Visit: Payer: Self-pay | Admitting: Genetic Counselor

## 2016-03-23 ENCOUNTER — Telehealth: Payer: Self-pay | Admitting: Genetic Counselor

## 2016-03-23 ENCOUNTER — Encounter: Payer: Self-pay | Admitting: Genetic Counselor

## 2016-03-23 DIAGNOSIS — Z853 Personal history of malignant neoplasm of breast: Secondary | ICD-10-CM

## 2016-03-23 DIAGNOSIS — Z803 Family history of malignant neoplasm of breast: Secondary | ICD-10-CM

## 2016-03-23 DIAGNOSIS — Z1379 Encounter for other screening for genetic and chromosomal anomalies: Secondary | ICD-10-CM | POA: Insufficient documentation

## 2016-03-23 NOTE — Progress Notes (Signed)
[facility]    Patient Name: Crystal Lloyd Patient DOB: [birth year] Patient Age: 71 y.o. Encounter Date: 03/23/2016  Referring Provider: Lurline Del, MD  Primary Care Provider: Sheela Stack, MD  Crystal Lloyd was called today to discuss genetic test results. Please see the Genetics note from her visit on 03/15/2016 for a detailed discussion of her personal and family history.  Genetic Testing: At the time of Crystal Lloyd's visit, we recommended she pursue genetic testing of multiple genes associated with a hereditary predisposition to cancer. Testing included sequencing and deletion/duplication analysis of 43 genes on [facility]'s Common Cancers panel (APC, ATM, AXIN2, BARD1, BMPR1A, BRCA1, BRCA2, BRIP1, CDH1, CDKN2A, CHEK2, DICER1, EPCAM, GREM1, HOXB13, KIT, MEN1, MLH1, MSH2, MSH6, MUTYH, NBN, NF1, PALB2, PDGFRA, PMS2, POLD1, POLE, PTEN, RAD50, RAD51C, RAD51D, SDHA, SDHB, SDHC, SDHD, SMAD4, SMARCA4, STK11, TP53, TSC1, TSC2, VHL). Testing was normal and did not reveal a mutation in these genes. A copy of the genetic test report will be scanned into Epic under the media tab.  Since the current test is not perfect, it is possible there may be a gene mutation that current testing cannot detect, but that chance is small. We also discussed that it is possible that a different genetic factor, which was not part of this testing or has not yet been discovered, is responsible for the cancer diagnoses in the family. Again, the likelihood of this is low. No additional testing is recommended at this time.   Cancer Screening: This result suggests that Crystal Lloyd cancer was most likely not due to an inherited predisposition. Most cancers happen by chance and this negative test, along with details of her family history, suggests that her cancer may fall into this category. We, therefore, recommended she continue to follow the cancer screening  guidelines provided by her physician.   Crystal Lloyd is also recommended to undergo a yearly gynecologic exam and speak with her GI doctor as to when to have her next colonoscopy.  Family Members: Given the young age of breast cancer in the family, women are recommended to have a yearly mammogram beginning at age 41, which is about 10 years earlier than the youngest breast cancer in the family and discuss with their own provider whether there is a benefit to also having a breast MRI. Women should also have a clinical breast exam every 6-12 months, a yearly gynecologic exam and perform monthly breast self-exams. Colon cancer screening is recommended to begin by age 81 in both men and women.  Lastly, cancer genetics is a rapidly advancing field and it is possible that new genetic tests will be appropriate for her in the future. We encourage her to remain in contact with [place] on an annual basis so we can update her personal and family histories, and let her know of advances in cancer genetics that may benefit the family. Our contact number was provided. Crystal Lloyd is welcome to call anytime with additional questions.    Marylouise Stacks, MS, [facility] Certified Genetic Counselor phone: [phone] [email or web address]

## 2016-03-23 NOTE — Telephone Encounter (Signed)
Dr. Isaiah Blakes returned my call. We discussed her negative results. While it is reassuring news, I reminded her that the test is not perfect and there may still be a hereditary factor in the family that we can not identify at this point.

## 2016-05-06 ENCOUNTER — Encounter (HOSPITAL_COMMUNITY): Payer: Self-pay

## 2016-05-06 ENCOUNTER — Institutional Professional Consult (permissible substitution): Payer: Medicare Other | Admitting: Internal Medicine

## 2016-05-06 ENCOUNTER — Ambulatory Visit (INDEPENDENT_AMBULATORY_CARE_PROVIDER_SITE_OTHER): Payer: Medicare Other | Admitting: Pulmonary Disease

## 2016-05-06 ENCOUNTER — Ambulatory Visit (INDEPENDENT_AMBULATORY_CARE_PROVIDER_SITE_OTHER)
Admission: RE | Admit: 2016-05-06 | Discharge: 2016-05-06 | Disposition: A | Discharge status: Home / Self Care | Payer: Medicare Other | Source: Ambulatory Visit | Attending: Pulmonary Disease | Admitting: Pulmonary Disease

## 2016-05-06 ENCOUNTER — Encounter: Payer: Self-pay | Admitting: Pulmonary Disease

## 2016-05-06 DIAGNOSIS — R05 Cough: Secondary | ICD-10-CM | POA: Diagnosis not present

## 2016-05-06 DIAGNOSIS — R053 Chronic cough: Secondary | ICD-10-CM

## 2016-05-06 DIAGNOSIS — J310 Chronic rhinitis: Secondary | ICD-10-CM | POA: Diagnosis not present

## 2016-05-06 DIAGNOSIS — K5730 Diverticulosis of large intestine without perforation or abscess without bleeding: Secondary | ICD-10-CM | POA: Insufficient documentation

## 2016-05-06 NOTE — Progress Notes (Signed)
Subjective:    Patient ID: Crystal Lloyd, female    DOB: [birth year], 71 y.o.   MRN: [id number]  HPI No history of allergies, breathing problems, or recurrent bronchitis as a child. She reports 7 years ago she started to notice persistent coughing post respiratory infections treated with Prednisone & Advair with resolution. Reviewed records from patient's referring physician. Patient reported hoarseness as well as coughing and sinus congestion for approximately 4 months. She reports she developed a "cold" at Thanksgiving with sore throat, mild cervical lymph node discomfort and sinus congestion that improve after approximately 1 week. She then relapsed with nasal congestion & drainage with a persistent cough. She has had mild improvement in her sinus congestion & nasal drainage over the last month. She was started on Praluent. She stopped her medication after her last dose on February. She reports her cough doesn't seem to be any worse on the days that she works. She reports she does use an albuterol inhaler at night that seems to help stop her coughing spells. She doesn't wake up coughing at night. Cough produced a "thick, yellow" mucus initially but now produces a "clear or white" mucus. The amount of phlegm is decreasing. No audible wheezing. She does have chest tightness which is relieved with the Albuterol inhaler. No chest pressure or pain. Rare reflux or dyspepsia. No morning brash water taste recently. She hasn't used her Advair since February. No recent fever, chills, or sweats. She does admit she has dyspnea and feels that this has limited her exercise.   Review of Systems No rashes or bruising. No dysuria or hematuria. A pertinent 14 point review of systems is negative except as per the history of presenting illness.  Allergies  Allergen Reactions  . Ativan [Lorazepam] Other (See Comments)    "depression" terrible headache  . Codeine Other (See Comments)    unknown  . Compazine  [Prochlorperazine Edisylate] Other (See Comments)    "twitching"  . Morphine And Related Nausea And Vomiting    Current Outpatient Prescriptions on File Prior to Visit  Medication Sig Dispense Refill  . acetaminophen (TYLENOL) 325 MG tablet Take 650 mg by mouth every 6 (six) hours as needed.    . ALPRAZolam (XANAX) 0.5 MG tablet Take 0.25 mg by mouth at bedtime as needed for anxiety.     Marland Kitchen aspirin EC 81 MG EC tablet Take 1 tablet (81 mg total) by mouth daily.    . Coenzyme Q10 (CO Q 10) 100 MG CAPS Take 200 mg by mouth daily.     . ergocalciferol (VITAMIN D2) 50000 UNITS capsule Take 50,000 Units by mouth once a week. Every Sunday    . levothyroxine (SYNTHROID, LEVOTHROID) 75 MCG tablet Take 75 mcg by mouth daily before breakfast.     . Multiple Vitamin (MULTIVITAMIN) tablet Take 1 tablet by mouth daily.    . Omega-3 Fatty Acids (FISH OIL PO) Take 1 g by mouth daily.     . polyethylene glycol (MIRALAX / GLYCOLAX) packet Take 17 g by mouth daily.    . Riboflavin (B-2-400 PO) Take 1 mg by mouth daily.     Marland Kitchen PRALUENT 150 MG/ML SOPN Every 2 weeks  11   No current facility-administered medications on file prior to visit.     Past Medical History:  Diagnosis Date  . Diverticulosis of colon   . Elevated creatine kinase level   . History of anal dysplasia    AIN III  . History of breast cancer  2000--  s/p  bilateral mastectomies, chemo and radiation therapy's//  no recurrence  . History of diverticulitis of colon    hx of a few times w/ only one requiring surgical intervention due to perfation in 2011  . History of scarlet fever   . Hypothyroidism   . Muscle pain   . Osteopenia   . PONV (postoperative nausea and vomiting)   . Wears glasses   . Wears hearing aid    bilateral    Past Surgical History:  Procedure Laterality Date  . APPENDECTOMY  age 56  . ARTHROSCOPIC REPAIR ACL Left 1989   Tendon Graft   . CARDIAC CATHETERIZATION  05-07-2003  dr Sabino Snipes   normal  coronary arteries and LVF, ef 70%  . CARDIOVASCULAR STRESS TEST  02-03-2003  dr Martinique   prominent apical thinning but no convinceing evidence for ischemia or infarct otherwise normal perfusion study/  ef 66%  . COLONOSCOPY  last one 2015  . COLOSTOMY TAKEDOWN  11-24-2009  . EXPLORATORY LAPAROTOMY/  SIGMOID COLECTOMY/ HARTMAN POUCH/ DESCENDING COLECTOMY  08-21-2009   diverticular perforated sigmoid colon w/ fecal peritonitis  . LAPAROSCOPIC BILATERAL SALPINGO OOPHERECTOMY  01-07-2003  . LEFT BREAST LUMPECTOMY  1999  . LUMBAR FUSION  08-22-2013   at [facility]  . MASTECTOMY Bilateral 2002  . MUSCLE BIOPSY Left 10/21/2014   Procedure: LEFT THIGH MUSCLE BIOPSY;  Surgeon: Armandina Gemma, [place];  Location: [facility];  Service: General;  Laterality: Left;  . REMOVAL ANORECTAL POLYP  08/ 2008   [facility]   Dr. Morton Stall  . TONSILLECTOMY  age 19  . TRANSTHORACIC ECHOCARDIOGRAM  01-25-2013   grade I diastolic dysfuntion, ef 97-67%/  mild MR/  trivial TR    Family History  Problem Relation Age of Onset  . Asthma Mother   . Emphysema Mother   . Hypertension Mother   . Diverticulitis Mother   . Hypertension Brother   . COPD Brother   . Hypertension Brother   . Cervical cancer Maternal Grandmother   . Cancer Other 54    breast  . Breast cancer Maternal Aunt   . Colon cancer Maternal Aunt   . Asthma Son     Social History   Social History  . Marital status: Married    Spouse name: N/A  . Number of children: N/A  . Years of education: N/A   Social History Main Topics  . Smoking status: Passive Smoke Exposure - Never Smoker  . Smokeless tobacco: Never Used     Comment: Parents growing up.   . Alcohol use 0.6 oz/week    1 Glasses of wine per week  . Drug use: No  . Sexual activity: Not Currently    Birth control/ protection: Post-menopausal   Other Topics Concern  . None   Social History Narrative   [facility] (05/06/16):   Originally from [facility]. Has lived in [place], [place], &  [place]. 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 No bird exposure but does have a friend with a bird. No mold or hot tub exposure. Enjoys walking & biking. Enjoys making crafts but does wear a mask with grinding and with fume exposure.       Objective:   Physical Exam BP 116/68 (BP Location: Right Arm, Cuff Size: Normal)   Pulse 80   Ht 5' (1.524 m)   Wt 117 lb (53.1 kg)   LMP 04/24/1997   SpO2 97%   BMI 22.85 kg/m  General:  Awake. Alert. No acute distress. Thin, Caucasian female. Integument:  Warm & dry. No rash on exposed skin. No bruising. Extremities:  No cyanosis or clubbing.  Lymphatics:  No appreciated cervical or supraclavicular lymphadenoapthy. HEENT:  Moist mucus membranes. No oral ulcers. No scleral injection or icterus. Minimal nasal turbinate swelling. Cardiovascular:  Regular rate. No edema. No appreciable JVD.  Pulmonary:  Good aeration & clear to auscultation bilaterally. Symmetric chest wall expansion. No accessory muscle use. Abdomen: Soft. Normal bowel sounds. Nondistended. Grossly nontender. Musculoskeletal:  Normal bulk and tone. Hand grip strength 5/5 bilaterally. No joint deformity or effusion appreciated. Neurological:  CN 2-12 grossly in tact. No meningismus. Moving all 4 extremities equally. Symmetric brachioradialis deep tendon reflexes. Psychiatric:  Mood and affect congruent. Speech normal rhythm, rate & tone.     Assessment & Plan:  71 y.o. Female with persistent cough and chronic rhinitis. She has a self diagnosed history of reactive airways disease reviewed with her family history of asthma and emphysema this needs to be investigated further. We did discuss the possible need for further chest imaging depending upon results from chest imaging today but will hold off on CT imaging at this time. Postnasal drainage is the most likely etiology at this point. I instructed the patient to contact my office if she had any new breathing problems or questions before  her next appointment.  1. Persistent Cough: Checking full pulmonary function testing as soon as possible. Also checking chest x-ray PA/LAT today. 2. Chronic rhinitis: Checking maxillofacial CT scan without contrast. Holding off on allergy testing at this time.  3. Health maintenance: Status post Prevnar vaccine January 2018. 4. Follow-up: Return to clinic in 3 weeks or sooner if needed.   Sonia Baller Ashok Cordia, M.D. [facility] Pager:  [phone] After 3pm or if no response, call [phone] 10:02 AM 05/06/16

## 2016-05-06 NOTE — Patient Instructions (Signed)
   Abstain from the Praluent for now.  Call or e-mail me if you think of anything else, have questions, or notice any new symptoms.  TESTS ORDERED: 1. Full PFTs ASAP 2. Maxillofacial CT LTD w/o contrast 3. CXR PA/LAT today

## 2016-05-11 ENCOUNTER — Ambulatory Visit (INDEPENDENT_AMBULATORY_CARE_PROVIDER_SITE_OTHER): Payer: Medicare Other | Admitting: Pulmonary Disease

## 2016-05-11 ENCOUNTER — Inpatient Hospital Stay: Admission: RE | Admit: 2016-05-11 | Payer: Medicare Other | Source: Ambulatory Visit

## 2016-05-11 ENCOUNTER — Ambulatory Visit (INDEPENDENT_AMBULATORY_CARE_PROVIDER_SITE_OTHER)
Admission: RE | Admit: 2016-05-11 | Discharge: 2016-05-11 | Disposition: A | Discharge status: Home / Self Care | Payer: Medicare Other | Source: Ambulatory Visit | Attending: Pulmonary Disease | Admitting: Pulmonary Disease

## 2016-05-11 DIAGNOSIS — R05 Cough: Secondary | ICD-10-CM | POA: Diagnosis not present

## 2016-05-11 DIAGNOSIS — R053 Chronic cough: Secondary | ICD-10-CM

## 2016-05-11 DIAGNOSIS — J342 Deviated nasal septum: Secondary | ICD-10-CM | POA: Diagnosis not present

## 2016-05-11 DIAGNOSIS — J310 Chronic rhinitis: Secondary | ICD-10-CM

## 2016-05-11 NOTE — Progress Notes (Signed)
PFT done today. Katie Welchel,CMA  

## 2016-05-12 ENCOUNTER — Institutional Professional Consult (permissible substitution): Payer: Medicare Other | Admitting: Pulmonary Disease

## 2016-05-12 ENCOUNTER — Telehealth: Payer: Self-pay | Admitting: Pulmonary Disease

## 2016-05-12 DIAGNOSIS — Q674 Other congenital deformities of skull, face and jaw: Secondary | ICD-10-CM

## 2016-05-12 NOTE — Telephone Encounter (Signed)
Pt wanting results to CT Sinus and PFT.  Pt states that she is still having a lot of congestion and is wanting to try and fix and get to the bottom of this soon as she is going on vacation in July to [place]. Pt wanting to know if she needs a sooner appt with JN or a referral to ENT.  Please advise Dr Ashok Cordia. Thanks.

## 2016-05-12 NOTE — Progress Notes (Signed)
Left message to contact office regarding results.

## 2016-05-18 NOTE — Telephone Encounter (Signed)
Please let the patient know that I have not received her pulmonary function test result. She does have an abnormality within her right maxillary sinus. There is also some deviation of her nasal septum. Certainly this could be contributing to her symptoms. Her x-ray did not show any obvious abnormality. Please put in a referral to ENT you here in [place] or at [facility] depending upon her preference. We will keep her appointment in May as scheduled. Thank you.

## 2016-05-18 NOTE — Telephone Encounter (Signed)
LM for pt x 1  

## 2016-05-19 NOTE — Telephone Encounter (Signed)
Patient returned phone call, patient contact # [phone].Crystal Lloyd

## 2016-05-19 NOTE — Telephone Encounter (Signed)
Pt is aware of results and voiced her understanding. 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 Pt states after reviewing results, she reached out to her audiologist to asked if he recommended a ENT. Pt states she would like referral to be placed to Dr. Melissa Montane. Referral has been placed. Pt is aware to keep her upcoming appointment. Pt voiced her understanding and had no further questions. Nothing further needed.

## 2016-05-19 NOTE — Telephone Encounter (Signed)
lmtcb x2 pt

## 2016-05-25 LAB — PULMONARY FUNCTION TEST
DL/VA % pred: 98 %
DL/VA: 4.18 ml/min/mmHg/L
DLCO cor % pred: 87 %
DLCO cor: 16.5 ml/min/mmHg
DLCO unc % pred: 91 %
DLCO unc: 17.22 ml/min/mmHg
FEF 25-75 Post: 2.26 L/sec
FEF 25-75 Pre: 2.03 L/sec
FEF2575-%Change-Post: 11 %
FEF2575-%Pred-Post: 131 %
FEF2575-%Pred-Pre: 117 %
FEV1-%Change-Post: 0 %
FEV1-%Pred-Post: 114 %
FEV1-%Pred-Pre: 113 %
FEV1-Post: 2.21 L
FEV1-Pre: 2.2 L
FEV1FVC-%Change-Post: 3 %
FEV1FVC-%Pred-Pre: 105 %
FEV6-%Change-Post: -3 %
FEV6-%Pred-Post: 108 %
FEV6-%Pred-Pre: 112 %
FEV6-Post: 2.65 L
FEV6-Pre: 2.74 L
FEV6FVC-%Change-Post: 0 %
FEV6FVC-%Pred-Post: 104 %
FEV6FVC-%Pred-Pre: 104 %
FVC-%Change-Post: -2 %
FVC-%Pred-Post: 104 %
FVC-%Pred-Pre: 107 %
FVC-Post: 2.66 L
FVC-Pre: 2.74 L
Post FEV1/FVC ratio: 83 %
Post FEV6/FVC ratio: 100 %
Pre FEV1/FVC ratio: 80 %
Pre FEV6/FVC Ratio: 100 %
RV % pred: 102 %
RV: 2.02 L
TLC % pred: 107 %
TLC: 4.78 L

## 2016-05-30 DIAGNOSIS — J342 Deviated nasal septum: Secondary | ICD-10-CM | POA: Diagnosis not present

## 2016-05-30 DIAGNOSIS — J324 Chronic pansinusitis: Secondary | ICD-10-CM | POA: Insufficient documentation

## 2016-06-05 ENCOUNTER — Telehealth: Payer: Self-pay | Admitting: Pulmonary Disease

## 2016-06-05 NOTE — Telephone Encounter (Signed)
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 No layering fluid. The RIGHT ostiomeatal unit is narrowed. Nasal septal deviation 2 mm LEFT-to-RIGHT along with LEFT middle turbinate concha bullosa are contributory.  CXR PA/LAT 05/06/16 (personally reviewed by me): Mild flattening of the diaphragms suggestive of hyperinflation. No parenchymal mass or opacity appreciated. No pleural effusion. Heart normal in size & mediastinum normal in contour.

## 2016-06-06 ENCOUNTER — Encounter: Payer: Self-pay | Admitting: Pulmonary Disease

## 2016-06-06 ENCOUNTER — Ambulatory Visit (INDEPENDENT_AMBULATORY_CARE_PROVIDER_SITE_OTHER): Payer: Medicare Other | Admitting: Pulmonary Disease

## 2016-06-06 VITALS — BP 92/70 | HR 78 | Ht 60.0 in | Wt 118.2 lb

## 2016-06-06 DIAGNOSIS — R05 Cough: Secondary | ICD-10-CM | POA: Diagnosis not present

## 2016-06-06 DIAGNOSIS — R053 Chronic cough: Secondary | ICD-10-CM

## 2016-06-06 DIAGNOSIS — J310 Chronic rhinitis: Secondary | ICD-10-CM | POA: Diagnosis not present

## 2016-06-06 NOTE — Progress Notes (Signed)
Subjective:    Patient ID: Crystal Lloyd, female    DOB: [birth year], 71 y.o.   MRN: [id number]  C.C.:  Follow-up for Persistent Cough & Chronic Rhinitis.   HPI Persistent cough: X-ray shows no parenchymal opacities but does suggest hyperinflation. Pulmonary function testing shows no significant bronchodilator response, obstruction, or abnormality with lung volumes or carbon monoxide diffusion capacity. She reports her cough has nearly totally resolved. She reports resolution of her wheezing. She attributes this to regular use of her Nasacort. No new dyspnea. No inhaler use since last appointment.   Chronic rhinitis: Maxillofacial CT scan showed nasal septal deviation as well as right maxillary sinus mucosal thickening. She has been using her Nasacort more regularly. She denies any significant sinus congestion or pressure. Still some mild post-nasal drainage. Referred to ENT (Dr. Janace Hoard) and currently on a course of treatment with Augmentin.  Review of Systems No chest tightness or pressure. No fever, chills or sweats. No abdominal pain, nausea or emesis.   Allergies  Allergen Reactions  . Ativan [Lorazepam] Other (See Comments)    "depression" terrible headache  . Codeine Other (See Comments)    unknown  . Compazine [Prochlorperazine Edisylate] Other (See Comments)    "twitching"  . Morphine And Related Nausea And Vomiting    Current Outpatient Prescriptions on File Prior to Visit  Medication Sig Dispense Refill  . acetaminophen (TYLENOL) 325 MG tablet Take 650 mg by mouth every 6 (six) hours as needed.    Marland Kitchen albuterol (PROVENTIL HFA;VENTOLIN HFA) 108 (90 Base) MCG/ACT inhaler Inhale 1-2 puffs into the lungs every 6 (six) hours as needed for wheezing or shortness of breath.    . ALPRAZolam (XANAX) 0.5 MG tablet Take 0.25 mg by mouth at bedtime as needed for anxiety.     Marland Kitchen aspirin EC 81 MG EC tablet Take 1 tablet (81 mg total) by mouth daily.    . Coenzyme Q10 (CO Q 10) 100 MG CAPS  Take 200 mg by mouth daily.     . ergocalciferol (VITAMIN D2) 50000 UNITS capsule Take 50,000 Units by mouth once a week. Every Sunday    . levothyroxine (SYNTHROID, LEVOTHROID) 75 MCG tablet Take 75 mcg by mouth daily before breakfast.     . Multiple Vitamin (MULTIVITAMIN) tablet Take 1 tablet by mouth daily.    . Omega-3 Fatty Acids (FISH OIL PO) Take 1 g by mouth daily.     . polyethylene glycol (MIRALAX / GLYCOLAX) packet Take 17 g by mouth daily.    Marland Kitchen PRALUENT 150 MG/ML SOPN Every 2 weeks  11  . Probiotic Product (PROBIOTIC ADVANCED PO) Take 1 capsule by mouth daily.    . Riboflavin (B-2-400 PO) Take 1 mg by mouth daily.     Marland Kitchen triamcinolone (NASACORT ALLERGY 24HR) 55 MCG/ACT AERO nasal inhaler Place 2 sprays into the nose daily as needed.     No current facility-administered medications on file prior to visit.     Past Medical History:  Diagnosis Date  . Diverticulosis of colon   . Elevated creatine kinase level   . History of anal dysplasia    AIN III  . History of breast cancer    2000--  s/p  bilateral mastectomies, chemo and radiation therapy's//  no recurrence  . History of diverticulitis of colon    hx of a few times w/ only one requiring surgical intervention due to perfation in 2011  . History of scarlet fever   . Hypothyroidism   .  Muscle pain   . Osteopenia   . PONV (postoperative nausea and vomiting)   . Wears glasses   . Wears hearing aid    bilateral    Past Surgical History:  Procedure Laterality Date  . APPENDECTOMY  age 70  . ARTHROSCOPIC REPAIR ACL Left 1989   Tendon Graft   . CARDIAC CATHETERIZATION  05-07-2003  dr Sabino Snipes   normal coronary arteries and LVF, ef 70%  . CARDIOVASCULAR STRESS TEST  02-03-2003  dr Martinique   prominent apical thinning but no convinceing evidence for ischemia or infarct otherwise normal perfusion study/  ef 66%  . COLONOSCOPY  last one 2015  . COLOSTOMY TAKEDOWN  11-24-2009  . EXPLORATORY LAPAROTOMY/  SIGMOID  COLECTOMY/ HARTMAN POUCH/ DESCENDING COLECTOMY  08-21-2009   diverticular perforated sigmoid colon w/ fecal peritonitis  . LAPAROSCOPIC BILATERAL SALPINGO OOPHERECTOMY  01-07-2003  . LEFT BREAST LUMPECTOMY  1999  . LUMBAR FUSION  08-22-2013   at [facility]  . MASTECTOMY Bilateral 2002  . MUSCLE BIOPSY Left 10/21/2014   Procedure: LEFT THIGH MUSCLE BIOPSY;  Surgeon: Armandina Gemma, [place];  Location: [facility];  Service: General;  Laterality: Left;  . REMOVAL ANORECTAL POLYP  08/ 2008   [facility]   Dr. Morton Stall  . TONSILLECTOMY  age 57  . TRANSTHORACIC ECHOCARDIOGRAM  01-25-2013   grade I diastolic dysfuntion, ef 09-32%/  mild MR/  trivial TR    Family History  Problem Relation Age of Onset  . Asthma Mother   . Emphysema Mother   . Hypertension Mother   . Diverticulitis Mother   . Hypertension Brother   . COPD Brother   . Hypertension Brother   . Cervical cancer Maternal Grandmother   . Cancer Other 54       breast  . Breast cancer Maternal Aunt   . Colon cancer Maternal Aunt   . Asthma Son     Social History   Social History  . Marital status: Divorced    Spouse name: N/A  . Number of children: N/A  . Years of education: N/A   Social History Main Topics  . Smoking status: Passive Smoke Exposure - Never Smoker  . Smokeless tobacco: Never Used     Comment: Parents growing up.   . Alcohol use 0.6 oz/week    1 Glasses of wine per week  . Drug use: No  . Sexual activity: Not Currently    Birth control/ protection: Post-menopausal   Other Topics Concern  . None   Social History Narrative   [facility] (05/06/16):   Originally from [place] [place]. Has lived in [place], [place], & [place]. 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 No bird exposure but does have a friend with a bird. No mold or hot tub exposure. Enjoys walking & biking. Enjoys making crafts but does wear a mask with grinding and with fume exposure.       Objective:   Physical Exam BP 92/70 (BP Location: Right Arm,  Patient Position: Sitting, Cuff Size: Normal)   Pulse 78   Ht 5' (1.524 m)   Wt 118 lb 3.2 oz (53.6 kg)   LMP 04/24/1997   SpO2 98%   BMI 23.08 kg/m   General:  Thin, Caucasian female.  Alert. No distress.  Integument:  Warm & dry. No bruising on exposed skin. Extremities:  No cyanosis or clubbing.  HEENT:  Minimal nasal turbinate swelling. No appreciable nasal septal deviation. Moist mucous membranes. Cardiovascular:  Regular  rhythm. Regular rate. No edema. Pulmonary:  Clear with auscultation. No accessory muscle use on room air. Aeration bilaterally.  Neurological: Cranial nerves grossly intact. No meningismus. Oriented 4.  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 No layering fluid. The RIGHT ostiomeatal unit is narrowed. Nasal septal deviation 2 mm LEFT-to-RIGHT along with LEFT middle turbinate concha bullosa are contributory.  CXR PA/LAT 05/06/16 (personally reviewed by me): Mild flattening of the diaphragms suggestive of hyperinflation. No parenchymal mass or opacity appreciated. No pleural effusion. Heart normal in size & mediastinum normal in contour.    Assessment & Plan:  71 y.o. female with persistent cough and chronic rhinitis. Chest x-ray imaging was personally reviewed by me today again with the patient. I reviewed the patient's pulmonary function testing with her today which showed no evidence of obstruction or significant bronchodilator response. Her lung volumes and carbon monoxide diffusion capacity are all normal. With her improvement on taking intranasal medication I believe this is likely the cause for her persistent cough. She exhibits no other  symptoms of asthma or reactive airways at this time. Therefore, I feel withholding further inhaled medications is reasonable. I instructed the patient contact my office if she had any new breathing problems or questions before next appointment.  1. Persistent cough:  Likely secondary to sinus congestion/postnasal drainage. Resolved. Continuing symptomatic monitoring. 2. Chronic rhinitis:  Continuing on Nasacort. Patient educated on proper technique with intranasal medications. Completing course of antibiotics with Dr. Janace Hoard. 3. Health maintenance: Status post Prevnar vaccine January 2018, Tdap January 2012, & Influenza Vaccine September 2017. 4. Follow-up: Return to clinic in 1 year or sooner if needed.  Sonia Baller Ashok Cordia, M.D. [facility] Pager:  [phone] After 3pm or if no response, call [phone] 10:16 AM 06/06/16

## 2016-06-06 NOTE — Patient Instructions (Signed)
   Call me if you have any new breathing problems or questions.  I will see you back in 1 year or sooner if needed.

## 2016-06-15 DIAGNOSIS — J324 Chronic pansinusitis: Secondary | ICD-10-CM | POA: Diagnosis not present

## 2016-06-24 DIAGNOSIS — H04123 Dry eye syndrome of bilateral lacrimal glands: Secondary | ICD-10-CM | POA: Diagnosis not present

## 2016-06-29 DIAGNOSIS — J329 Chronic sinusitis, unspecified: Secondary | ICD-10-CM | POA: Diagnosis not present

## 2016-06-29 DIAGNOSIS — J324 Chronic pansinusitis: Secondary | ICD-10-CM | POA: Diagnosis not present

## 2016-06-29 DIAGNOSIS — J328 Other chronic sinusitis: Secondary | ICD-10-CM | POA: Diagnosis not present

## 2016-08-15 DIAGNOSIS — R748 Abnormal levels of other serum enzymes: Secondary | ICD-10-CM | POA: Diagnosis not present

## 2016-08-15 DIAGNOSIS — E784 Other hyperlipidemia: Secondary | ICD-10-CM | POA: Diagnosis not present

## 2016-08-15 DIAGNOSIS — R7301 Impaired fasting glucose: Secondary | ICD-10-CM | POA: Diagnosis not present

## 2016-08-15 DIAGNOSIS — E038 Other specified hypothyroidism: Secondary | ICD-10-CM | POA: Diagnosis not present

## 2016-08-15 DIAGNOSIS — E785 Hyperlipidemia, unspecified: Secondary | ICD-10-CM | POA: Diagnosis not present

## 2016-08-15 DIAGNOSIS — E559 Vitamin D deficiency, unspecified: Secondary | ICD-10-CM | POA: Diagnosis not present

## 2016-08-17 DIAGNOSIS — H903 Sensorineural hearing loss, bilateral: Secondary | ICD-10-CM | POA: Diagnosis not present

## 2016-08-22 DIAGNOSIS — M8580 Other specified disorders of bone density and structure, unspecified site: Secondary | ICD-10-CM | POA: Diagnosis not present

## 2016-08-22 DIAGNOSIS — J329 Chronic sinusitis, unspecified: Secondary | ICD-10-CM | POA: Diagnosis not present

## 2016-08-22 DIAGNOSIS — E038 Other specified hypothyroidism: Secondary | ICD-10-CM | POA: Diagnosis not present

## 2016-08-22 DIAGNOSIS — M48061 Spinal stenosis, lumbar region without neurogenic claudication: Secondary | ICD-10-CM | POA: Diagnosis not present

## 2016-09-02 DIAGNOSIS — Z853 Personal history of malignant neoplasm of breast: Secondary | ICD-10-CM | POA: Diagnosis not present

## 2016-09-02 DIAGNOSIS — C50911 Malignant neoplasm of unspecified site of right female breast: Secondary | ICD-10-CM | POA: Diagnosis not present

## 2016-09-02 DIAGNOSIS — C50912 Malignant neoplasm of unspecified site of left female breast: Secondary | ICD-10-CM | POA: Diagnosis not present

## 2016-09-12 DIAGNOSIS — C50911 Malignant neoplasm of unspecified site of right female breast: Secondary | ICD-10-CM | POA: Diagnosis not present

## 2016-09-12 DIAGNOSIS — Z853 Personal history of malignant neoplasm of breast: Secondary | ICD-10-CM | POA: Diagnosis not present

## 2016-09-12 DIAGNOSIS — C50912 Malignant neoplasm of unspecified site of left female breast: Secondary | ICD-10-CM | POA: Diagnosis not present

## 2016-09-23 DIAGNOSIS — C50911 Malignant neoplasm of unspecified site of right female breast: Secondary | ICD-10-CM | POA: Diagnosis not present

## 2016-09-23 DIAGNOSIS — Z853 Personal history of malignant neoplasm of breast: Secondary | ICD-10-CM | POA: Diagnosis not present

## 2016-09-23 DIAGNOSIS — C50912 Malignant neoplasm of unspecified site of left female breast: Secondary | ICD-10-CM | POA: Diagnosis not present

## 2016-10-08 DIAGNOSIS — Z23 Encounter for immunization: Secondary | ICD-10-CM | POA: Diagnosis not present

## 2016-11-25 DIAGNOSIS — R748 Abnormal levels of other serum enzymes: Secondary | ICD-10-CM | POA: Diagnosis not present

## 2016-12-01 ENCOUNTER — Encounter (HOSPITAL_COMMUNITY): Payer: Self-pay

## 2016-12-01 ENCOUNTER — Emergency Department (HOSPITAL_COMMUNITY): Payer: Medicare Other

## 2016-12-01 ENCOUNTER — Emergency Department (HOSPITAL_COMMUNITY)
Admission: EM | Admit: 2016-12-01 | Discharge: 2016-12-01 | Disposition: A | Discharge status: Home / Self Care | Payer: Medicare Other | Attending: Emergency Medicine | Admitting: Emergency Medicine

## 2016-12-01 DIAGNOSIS — Z853 Personal history of malignant neoplasm of breast: Secondary | ICD-10-CM | POA: Insufficient documentation

## 2016-12-01 DIAGNOSIS — E039 Hypothyroidism, unspecified: Secondary | ICD-10-CM | POA: Diagnosis not present

## 2016-12-01 DIAGNOSIS — Z7982 Long term (current) use of aspirin: Secondary | ICD-10-CM | POA: Insufficient documentation

## 2016-12-01 DIAGNOSIS — N132 Hydronephrosis with renal and ureteral calculous obstruction: Secondary | ICD-10-CM | POA: Diagnosis not present

## 2016-12-01 DIAGNOSIS — R310 Gross hematuria: Secondary | ICD-10-CM | POA: Diagnosis not present

## 2016-12-01 DIAGNOSIS — Z79899 Other long term (current) drug therapy: Secondary | ICD-10-CM | POA: Insufficient documentation

## 2016-12-01 DIAGNOSIS — Z9013 Acquired absence of bilateral breasts and nipples: Secondary | ICD-10-CM | POA: Diagnosis not present

## 2016-12-01 DIAGNOSIS — N201 Calculus of ureter: Secondary | ICD-10-CM | POA: Insufficient documentation

## 2016-12-01 DIAGNOSIS — R1032 Left lower quadrant pain: Secondary | ICD-10-CM | POA: Diagnosis present

## 2016-12-01 LAB — URINALYSIS, ROUTINE W REFLEX MICROSCOPIC
Bilirubin Urine: NEGATIVE
Glucose, UA: NEGATIVE mg/dL
Ketones, ur: NEGATIVE mg/dL
Leukocytes, UA: NEGATIVE
Nitrite: NEGATIVE
Protein, ur: 100 mg/dL — AB
Specific Gravity, Urine: 1.018 (ref 1.005–1.030)
pH: 5 (ref 5.0–8.0)

## 2016-12-01 LAB — BASIC METABOLIC PANEL
Anion gap: 12 (ref 5–15)
BUN: 22 mg/dL — ABNORMAL HIGH (ref 6–20)
CO2: 23 mmol/L (ref 22–32)
Calcium: 9.7 mg/dL (ref 8.9–10.3)
Chloride: 107 mmol/L (ref 101–111)
Creatinine, Ser: 0.74 mg/dL (ref 0.44–1.00)
GFR calc Af Amer: >60 mL/min (ref >60)
GFR calc non Af Amer: >60 mL/min (ref >60)
Glucose, Bld: 160 mg/dL — ABNORMAL HIGH (ref 65–99)
Potassium: 3.7 mmol/L (ref 3.5–5.1)
Sodium: 142 mmol/L (ref 135–145)

## 2016-12-01 LAB — CBC
HCT: 41.1 % (ref 36.0–46.0)
Hemoglobin: 13.7 g/dL (ref 12.0–15.0)
MCH: 30.2 pg (ref 26.0–34.0)
MCHC: 33.3 g/dL (ref 30.0–36.0)
MCV: 90.5 fL (ref 78.0–100.0)
Platelets: 221 10*3/uL (ref 150–400)
RBC: 4.54 MIL/uL (ref 3.87–5.11)
RDW: 12.7 % (ref 11.5–15.5)
WBC: 6.8 10*3/uL (ref 4.0–10.5)

## 2016-12-01 MED ORDER — ONDANSETRON HCL 4 MG PO TABS: 4.0000 mg | ORAL_TABLET | Freq: Four times a day (QID) | ORAL | 0 refills | Status: DC | PRN

## 2016-12-01 MED ORDER — TAMSULOSIN HCL 0.4 MG PO CAPS
0.4000 mg | ORAL_CAPSULE | Freq: Every day | ORAL | 0 refills | Status: DC
Start: 2016-12-01 — End: 2017-06-12

## 2016-12-01 MED ORDER — ONDANSETRON HCL 4 MG/2ML IJ SOLN
4.0000 mg | Freq: Once | INTRAMUSCULAR | Status: AC
  Administered 2016-12-01: 4 mg via INTRAVENOUS
  Filled 2016-12-01: qty 2

## 2016-12-01 MED ORDER — SODIUM CHLORIDE 0.9 % IV SOLN
Freq: Once | INTRAVENOUS | Status: AC
  Administered 2016-12-01: 06:00:00 via INTRAVENOUS

## 2016-12-01 MED ORDER — OXYCODONE-ACETAMINOPHEN 5-325 MG PO TABS
1.0000 | ORAL_TABLET | Freq: Four times a day (QID) | ORAL | 0 refills | Status: DC | PRN
Start: 2016-12-01 — End: 2017-06-12

## 2016-12-01 MED ORDER — KETOROLAC TROMETHAMINE 30 MG/ML IJ SOLN
15.0000 mg | Freq: Once | INTRAMUSCULAR | Status: AC
  Administered 2016-12-01: 15 mg via INTRAVENOUS
  Filled 2016-12-01: qty 1

## 2016-12-01 NOTE — ED Provider Notes (Signed)
[facility] DEPT Provider Note   CSN: [id number] Arrival date & time: 12/01/16  9629     History   Chief Complaint Chief Complaint  Patient presents with  . Flank Pain    HPI Crystal Lloyd is a 71 y.o. female w PMHx of reticulitis, hypothyroidism, TIA, breast cancer, presented to the ED with acute onset of left flank pain that woke her from her sleep early this morning.  Patient states pain was constant and severe with associated nausea and vomiting.  Pain radiates around towards her abdomen.  She states earlier this week she had an episode of hematuria which occurred again this morning prior to arrival.  She states she had bright red blood in her urine.  Denies urinary frequency, dysuria, fever, chest pain, shortness of breath, vaginal bleeding or discharge.  Denies history of nephrolithiasis.  She states pain does not feel similar to pain from diverticulitis.  Pt given Toradol and Zofran in triage, which she states significantly improved her symptoms.  The history is provided by the patient.    Past Medical History:  Diagnosis Date  . Diverticulosis of colon   . Elevated creatine kinase level   . History of anal dysplasia    AIN III  . History of breast cancer    2000--  s/p  bilateral mastectomies, chemo and radiation therapy's//  no recurrence  . History of diverticulitis of colon    hx of a few times w/ only one requiring surgical intervention due to perfation in 2011  . History of scarlet fever   . Hypothyroidism   . Muscle pain   . Osteopenia   . PONV (postoperative nausea and vomiting)   . Wears glasses   . Wears hearing aid    bilateral    Patient Active Problem List   Diagnosis Date Noted  . Chronic rhinitis 05/06/2016  . Persistent cough 05/06/2016  . Diverticulosis of colon without hemorrhage 05/06/2016  . Genetic testing 03/23/2016  . 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 Elevated CK 10/20/2014  . Transient global amnesia  01/24/2014  . TIA (transient ischemic attack) 01/24/2014  . TGA (transient global amnesia) 01/24/2014  . Vaginal atrophy   . Osteopenia   . Hearing loss 05/31/2011  . History of breast cancer 05/31/2011  . Hypothyroidism 05/31/2011  . Hyperlipidemia 05/31/2011    Past Surgical History:  Procedure Laterality Date  . APPENDECTOMY  age 68  . ARTHROSCOPIC REPAIR ACL Left 1989   Tendon Graft   . CARDIAC CATHETERIZATION  05-07-2003  dr Sabino Snipes   normal coronary arteries and LVF, ef 70%  . CARDIOVASCULAR STRESS TEST  02-03-2003  dr Martinique   prominent apical thinning but no convinceing evidence for ischemia or infarct otherwise normal perfusion study/  ef 66%  . COLONOSCOPY  last one 2015  . COLOSTOMY TAKEDOWN  11-24-2009  . EXPLORATORY LAPAROTOMY/  SIGMOID COLECTOMY/ HARTMAN POUCH/ DESCENDING COLECTOMY  08-21-2009   diverticular perforated sigmoid colon w/ fecal peritonitis  . LAPAROSCOPIC BILATERAL SALPINGO OOPHERECTOMY  01-07-2003  . LEFT BREAST LUMPECTOMY  1999  . LUMBAR FUSION  08-22-2013   at [facility]  . MASTECTOMY Bilateral 2002  . REMOVAL ANORECTAL POLYP  08/ 2008   [facility]   Dr. Morton Stall  . TONSILLECTOMY  age 4  . TRANSTHORACIC ECHOCARDIOGRAM  01-25-2013   grade I diastolic dysfuntion, ef 52-84%/  mild MR/  trivial TR    OB History    Gravida Para Term Preterm AB Living  2 2 2  0 0 2   SAB TAB Ectopic Multiple Live Births   0 0 0 0 2       Home Medications    Prior to Admission medications   Medication Sig Start Date End Date Taking? Authorizing Provider  acetaminophen (TYLENOL) 325 MG tablet Take 650 mg every 6 (six) hours as needed by mouth for mild pain.    Yes [provider]  albuterol (PROVENTIL HFA;VENTOLIN HFA) 108 (90 Base) MCG/ACT inhaler Inhale 1-2 puffs into the lungs every 6 (six) hours as needed for wheezing or shortness of breath.   Yes [provider]  ALPRAZolam Duanne Moron) 0.5 MG tablet Take 0.25 mg by mouth at bedtime as needed for  anxiety.    Yes [provider]  aspirin EC 81 MG EC tablet Take 1 tablet (81 mg total) by mouth daily. 01/25/14  Yes Shon Baton, MD  Coenzyme Q10 (CO Q 10) 100 MG CAPS Take 200 mg by mouth daily.    Yes [provider]  ergocalciferol (VITAMIN D2) 50000 UNITS capsule Take 50,000 Units by mouth once a week. Every Sunday   Yes [provider]  levothyroxine (SYNTHROID, LEVOTHROID) 75 MCG tablet Take 75 mcg by mouth daily before breakfast.    Yes [provider]  Multiple Vitamin (MULTIVITAMIN) tablet Take 1 tablet by mouth daily.   Yes [provider]  Omega-3 Fatty Acids (FISH OIL PO) Take 1 g by mouth daily.    Yes [provider]  polyethylene glycol (MIRALAX / GLYCOLAX) packet Take 17 g by mouth daily.   Yes [provider]  PRALUENT 150 MG/ML SOPN Inject 150 mg into the skin. Every 2 weeks  01/04/16  Yes [provider]  Probiotic Product (PROBIOTIC ADVANCED PO) Take 1 capsule by mouth daily.   Yes [provider]  Riboflavin (B-2-400 PO) Take 1 mg by mouth daily.    Yes [provider]  triamcinolone (NASACORT ALLERGY 24HR) 55 MCG/ACT AERO nasal inhaler Place 2 sprays into the nose daily as needed.   Yes [provider]  ondansetron (ZOFRAN) 4 MG tablet Take 1 tablet (4 mg total) every 6 (six) hours as needed by mouth for nausea or vomiting. 12/01/16   Robinson, [place] N, PA-C  oxyCODONE-acetaminophen (PERCOCET/ROXICET) 5-325 MG tablet Take 1-2 tablets every 6 (six) hours as needed by mouth for severe pain. 12/01/16   Robinson, [place] N, PA-C  tamsulosin (FLOMAX) 0.4 MG CAPS capsule Take 1 capsule (0.4 mg total) daily by mouth. 12/01/16   Robinson, [place] N, PA-C    Family History Family History  Problem Relation Age of Onset  . Asthma Mother   . Emphysema Mother   . Hypertension Mother   . Diverticulitis Mother   . Hypertension Brother   . COPD Brother   . Hypertension Brother   . Cervical  cancer Maternal Grandmother   . Cancer Other 54       breast  . Breast cancer Maternal Aunt   . Colon cancer Maternal Aunt   . Asthma Son     Social History Social History   Tobacco Use  . Smoking status: Passive Smoke Exposure - Never Smoker  . Smokeless tobacco: Never Used  . Tobacco comment: Parents growing up.   Substance Use Topics  . Alcohol use: Yes    Alcohol/week: 0.6 oz    Types: 1 Glasses of wine per week  . Drug use: No     Allergies   Ativan [  lorazepam]; Codeine; Compazine [prochlorperazine edisylate]; Morphine and related; and Phenergan [promethazine hcl]   Review of Systems Review of Systems  Constitutional: Negative for chills and fever.  Respiratory: Negative for shortness of breath.   Cardiovascular: Negative for chest pain.  Gastrointestinal: Positive for nausea and vomiting.  Genitourinary: Positive for flank pain (left) and hematuria. Negative for dysuria, frequency, vaginal bleeding and vaginal discharge.  All other systems reviewed and are negative.   Physical Exam Updated Vital Signs BP 118/63 (BP Location: Right Arm)   Pulse 76   Temp 97.9 F (36.6 C) (Oral)   Resp 18   Ht 5' (1.524 m)   Wt 53.5 kg (118 lb)   LMP 04/24/1997   SpO2 95%   BMI 23.05 kg/m   Physical Exam  Constitutional: She appears well-developed and well-nourished. No distress.  HENT:  Head: Normocephalic and atraumatic.  Mouth/Throat: Oropharynx is clear and moist.  Eyes: Conjunctivae are normal.  Cardiovascular: Regular rhythm, normal heart sounds and intact distal pulses.  Pulmonary/Chest: Effort normal and breath sounds normal. No stridor. No respiratory distress. She has no wheezes. She has no rales.  Abdominal: Soft. Bowel sounds are normal. She exhibits no distension and no mass. There is tenderness in the suprapubic area and left lower quadrant. There is CVA tenderness (left). There is no rebound and no guarding.  Left CVA tenderness wrapping around the left  flank into the left lower abdomen and suprapubic region. Abdomen with old surgical scars.  Neurological: She is alert.  Skin: Skin is warm.  Psychiatric: She has a normal mood and affect. Her behavior is normal.  Nursing note and vitals reviewed.    ED Treatments / Results  Labs (all labs ordered are listed, but only abnormal results are displayed) Labs Reviewed  URINALYSIS, ROUTINE W REFLEX MICROSCOPIC - Abnormal; Notable for the following components:      Result Value   Color, Urine AMBER (*)    APPearance CLOUDY (*)    Hgb urine dipstick LARGE (*)    Protein, ur 100 (*)    Bacteria, UA RARE (*)    Squamous Epithelial / LPF 0-5 (*)    All other components within normal limits  BASIC METABOLIC PANEL - Abnormal; Notable for the following components:   Glucose, Bld 160 (*)    BUN 22 (*)    All other components within normal limits  CBC    EKG  EKG Interpretation None       Radiology Ct Renal Stone Study  Result Date: 12/01/2016 CLINICAL DATA:  Acute onset left flank pain and gross hematuria yesterday. EXAM: CT ABDOMEN AND PELVIS WITHOUT CONTRAST TECHNIQUE: Multidetector CT imaging of the abdomen and pelvis was performed following the standard protocol without IV contrast. COMPARISON:  01/05/2014 FINDINGS: Lower chest: No acute findings. Hepatobiliary: No mass visualized on this unenhanced exam. Gallbladder is unremarkable. Pancreas: No mass or inflammatory process visualized on this unenhanced exam. Spleen:  Within normal limits in size. Adrenals/Urinary tract: Several small less than 5 mm calculi are seen in lower poles of both kidneys. Mild left hydroureteronephrosis is seen with a 3 mm calculus in the mid-pelvic portion of the left ureter at the level of the iliac vessels. No other ureteral or bladder calculi identified. Stomach/Bowel: No evidence of obstruction, inflammatory process, or abnormal fluid collections. Right-sided colonic diverticulosis is again seen, without  evidence of diverticulitis. Vascular/Lymphatic: No pathologically enlarged lymph nodes identified. No evidence of abdominal aortic aneurysm. Aortic atherosclerosis. Reproductive:  No mass or other  significant abnormality. Other:  None. Musculoskeletal: No suspicious bone lesions identified. Prior PLIF at L3-4. IMPRESSION: Mild left hydroureteronephrosis due to 3 mm calculus in the mid pelvic portion of the left ureter. Bilateral nephrolithiasis. Colonic diverticulosis, without radiographic evidence of diverticulitis. Electronically Signed   By: Earle Gell M.D.   On: 12/01/2016 08:51    Procedures Procedures (including critical care time)  Medications Ordered in ED Medications  ketorolac (TORADOL) 30 MG/ML injection 15 mg (15 mg Intravenous Given 12/01/16 0600)  ondansetron (ZOFRAN) injection 4 mg (4 mg Intravenous Given 12/01/16 0559)  0.9 %  sodium chloride infusion ( Intravenous Stopped 12/01/16 0703)     Initial Impression / Assessment and Plan / ED Course  I have reviewed the triage vital signs and the nursing notes.  Pertinent labs & imaging results that were available during my care of the patient were reviewed by me and considered in my medical decision making (see chart for details).     Pt has been diagnosed with a 39mm nonobstructing stone in the left ureter via CT. There is no evidence of significant hydronephrosis, serum creatine WNL, vitals sign stable and the pt does not have intractable vomiting. U/A neg for infection. Pt will be dc home with pain medication, zofran, flomax & has been given a urology referral as needed. Pt is well-appearing prior to discharge.  Patient discussed with and seen by Dr. Ellender Hose.  Discussed results, findings, treatment and follow up. Patient advised of return precautions. Patient verbalized understanding and agreed with plan.  Final Clinical Impressions(s) / ED Diagnoses   Final diagnoses:  Left ureteral stone    ED Discharge Orders         Ordered    ondansetron (ZOFRAN) 4 MG tablet  Every 6 hours PRN     12/01/16 0916    oxyCODONE-acetaminophen (PERCOCET/ROXICET) 5-325 MG tablet  Every 6 hours PRN     12/01/16 0916    tamsulosin (FLOMAX) 0.4 MG CAPS capsule  Daily     12/01/16 0916       Robinson, [place] N, PA-C 12/01/16 3646    Duffy Bruce, MD 12/02/16 [phone]

## 2016-12-01 NOTE — ED Notes (Signed)
Pt complains of flank pain suddenly at 4am, yesterday she thought she saw blood in her urine and this am there was lots of hematuria

## 2016-12-01 NOTE — ED Notes (Signed)
Left flank and lower abdominal pain 9 out of 10. Pt. Denies any pain right now. Pt. States Toradol brought her pain down to a 3 or 4 and went to sleep. She woke back up and does not have any pain.

## 2016-12-01 NOTE — Discharge Instructions (Signed)
Please read instructions below. Drink plenty of water. You can take 1-2 tabs of Oxycodone every 4-6 hours as needed for pain.  Do not take tylenol, drive, or drink alcohol while taking this medication. You can take advil with this medication or alone as needed for mild to moderate pain. You can take Zofran every 6 hours as needed for nausea. Take flomax once per day for bladder spasm. Follow up with Urology if you have not passed the stone in the next few days. Return to the ER for fever, chills, uncontrollable vomiting, or worsening symptoms.

## 2016-12-24 DIAGNOSIS — K5733 Diverticulitis of large intestine without perforation or abscess with bleeding: Secondary | ICD-10-CM

## 2016-12-24 HISTORY — DX: Diverticulitis of large intestine without perforation or abscess with bleeding: K57.33

## 2017-01-26 DIAGNOSIS — K6282 Dysplasia of anus: Secondary | ICD-10-CM | POA: Diagnosis not present

## 2017-01-26 DIAGNOSIS — K6389 Other specified diseases of intestine: Secondary | ICD-10-CM | POA: Diagnosis not present

## 2017-01-26 DIAGNOSIS — Z933 Colostomy status: Secondary | ICD-10-CM | POA: Diagnosis not present

## 2017-01-26 DIAGNOSIS — K625 Hemorrhage of anus and rectum: Secondary | ICD-10-CM | POA: Diagnosis not present

## 2017-01-26 DIAGNOSIS — Z86010 Personal history of colonic polyps: Secondary | ICD-10-CM | POA: Diagnosis not present

## 2017-01-30 DIAGNOSIS — N202 Calculus of kidney with calculus of ureter: Secondary | ICD-10-CM | POA: Diagnosis not present

## 2017-02-10 DIAGNOSIS — R748 Abnormal levels of other serum enzymes: Secondary | ICD-10-CM | POA: Diagnosis not present

## 2017-02-10 DIAGNOSIS — M859 Disorder of bone density and structure, unspecified: Secondary | ICD-10-CM | POA: Diagnosis not present

## 2017-02-10 DIAGNOSIS — E038 Other specified hypothyroidism: Secondary | ICD-10-CM | POA: Diagnosis not present

## 2017-02-10 DIAGNOSIS — R7301 Impaired fasting glucose: Secondary | ICD-10-CM | POA: Diagnosis not present

## 2017-02-13 DIAGNOSIS — R7301 Impaired fasting glucose: Secondary | ICD-10-CM | POA: Diagnosis not present

## 2017-02-13 DIAGNOSIS — E038 Other specified hypothyroidism: Secondary | ICD-10-CM | POA: Diagnosis not present

## 2017-02-13 DIAGNOSIS — E7849 Other hyperlipidemia: Secondary | ICD-10-CM | POA: Diagnosis not present

## 2017-02-13 DIAGNOSIS — C50919 Malignant neoplasm of unspecified site of unspecified female breast: Secondary | ICD-10-CM | POA: Diagnosis not present

## 2017-05-17 DIAGNOSIS — R748 Abnormal levels of other serum enzymes: Secondary | ICD-10-CM | POA: Diagnosis not present

## 2017-06-12 ENCOUNTER — Encounter: Payer: Self-pay | Admitting: Obstetrics & Gynecology

## 2017-06-12 ENCOUNTER — Ambulatory Visit (INDEPENDENT_AMBULATORY_CARE_PROVIDER_SITE_OTHER): Payer: Medicare Other | Admitting: Obstetrics & Gynecology

## 2017-06-12 ENCOUNTER — Other Ambulatory Visit: Payer: Self-pay

## 2017-06-12 VITALS — BP 110/64 | HR 68 | Resp 16 | Ht 59.75 in | Wt 119.8 lb

## 2017-06-12 DIAGNOSIS — Z01419 Encounter for gynecological examination (general) (routine) without abnormal findings: Secondary | ICD-10-CM | POA: Diagnosis not present

## 2017-06-12 NOTE — Progress Notes (Signed)
72 y.o. [id number] Divorced, remarried, CaucasianF here for annual exam.  Son in [place] is expecting now.  She is now 14 weeks.    Thinking of "retiring" in August and working on part time basis after that time.   Denies vaginal bleeding.    Patient's last menstrual period was 04/24/1997.          Sexually active: No.  The current method of family planning is post menopausal status.    Exercising: Yes.    walking 3-4 x weekly Smoker:  no  Health Maintenance: Pap:  01/05/16 Neg. HR HPV:neg  10/31/11 Neg. HR HPV:neg  History of abnormal Pap:  no MMG:  2002 Double mastectomy  Colonoscopy:  01/2017 f/u 3 years  BMD:   2017 Osteopenia  TDaP:  2018 Pneumonia vaccine(s):  2017 Shingrix:   05/31/17 Hep C testing: done  Screening Labs: PCP   reports that she is a non-smoker but has been exposed to tobacco smoke. She has never used smokeless tobacco. She reports that she drinks about 0.6 oz of alcohol per week. She reports that she does not use drugs.  Past Medical History:  Diagnosis Date  . Diverticulitis of colon with bleeding 12/2016   Colonoscopy 01/2017  . Diverticulosis of colon   . Elevated creatine kinase level   . History of anal dysplasia    AIN III  . History of breast cancer    2000--  s/p  bilateral mastectomies, chemo and radiation therapy's//  no recurrence  . History of diverticulitis of colon    hx of a few times w/ only one requiring surgical intervention due to perfation in 2011  . History of scarlet fever   . Hypothyroidism   . Kidney stones 11/2016  . Muscle pain   . Osteopenia   . PONV (postoperative nausea and vomiting)   . Wears glasses   . Wears hearing aid    bilateral    Past Surgical History:  Procedure Laterality Date  . APPENDECTOMY  age 9  . ARTHROSCOPIC REPAIR ACL Left 1989   Tendon Graft   . CARDIAC CATHETERIZATION  05-07-2003  dr Sabino Snipes   normal coronary arteries and LVF, ef 70%  . CARDIOVASCULAR STRESS TEST  02-03-2003  dr Martinique    prominent apical thinning but no convinceing evidence for ischemia or infarct otherwise normal perfusion study/  ef 66%  . COLONOSCOPY  last one 2015  . COLOSTOMY TAKEDOWN  11-24-2009  . EXPLORATORY LAPAROTOMY/  SIGMOID COLECTOMY/ HARTMAN POUCH/ DESCENDING COLECTOMY  08-21-2009   diverticular perforated sigmoid colon w/ fecal peritonitis  . LAPAROSCOPIC BILATERAL SALPINGO OOPHERECTOMY  01-07-2003  . LEFT BREAST LUMPECTOMY  1999  . LUMBAR FUSION  08-22-2013   at [facility]  . MASTECTOMY Bilateral 2002  . MUSCLE BIOPSY Left 10/21/2014   Procedure: LEFT THIGH MUSCLE BIOPSY;  Surgeon: Armandina Gemma, MD;  Location: [facility];  Service: General;  Laterality: Left;  . REMOVAL ANORECTAL POLYP  08/ 2008   [facility]   Dr. Morton Stall  . TONSILLECTOMY  age 67  . TRANSTHORACIC ECHOCARDIOGRAM  01-25-2013   grade I diastolic dysfuntion, ef 86-57%/  mild MR/  trivial TR    Current Outpatient Medications  Medication Sig Dispense Refill  . acetaminophen (TYLENOL) 325 MG tablet Take 650 mg every 6 (six) hours as needed by mouth for mild pain.     Marland Kitchen albuterol (PROVENTIL HFA;VENTOLIN HFA) 108 (90 Base) MCG/ACT inhaler Inhale 1-2 puffs into the lungs every 6 (six) hours  as needed for wheezing or shortness of breath.    . ALPRAZolam (XANAX) 0.5 MG tablet Take 0.25 mg by mouth at bedtime as needed for anxiety.     . Coenzyme Q10 (CO Q 10) 100 MG CAPS Take 200 mg by mouth daily.     . ergocalciferol (VITAMIN D2) 50000 UNITS capsule Take 50,000 Units by mouth once a week. Every Sunday    . levothyroxine (SYNTHROID, LEVOTHROID) 75 MCG tablet Take 75 mcg by mouth daily before breakfast.     . Multiple Vitamin (MULTIVITAMIN) tablet Take 1 tablet by mouth daily.    . Omega-3 Fatty Acids (FISH OIL PO) Take 1 g by mouth daily.     . polyethylene glycol (MIRALAX / GLYCOLAX) packet Take 17 g by mouth daily.    . Probiotic Product (PROBIOTIC ADVANCED PO) Take 1 capsule by mouth daily.    Marland Kitchen REPATHA SURECLICK 701  MG/ML SOAJ Inject 140 mg into the skin every 14 (fourteen) days.  5  . Riboflavin (B-2-400 PO) Take 1 mg by mouth daily.     Marland Kitchen triamcinolone (NASACORT ALLERGY 24HR) 55 MCG/ACT AERO nasal inhaler Place 2 sprays into the nose daily as needed.     No current facility-administered medications for this visit.     Family History  Problem Relation Age of Onset  . Asthma Mother   . Emphysema Mother   . Hypertension Mother   . Diverticulitis Mother   . Hypertension Brother   . COPD Brother   . Hypertension Brother   . Cervical cancer Maternal Grandmother   . Cancer Other 54       breast  . Breast cancer Maternal Aunt   . Colon cancer Maternal Aunt   . Asthma Son     Review of Systems  Musculoskeletal:       Muscle weakness   All other systems reviewed and are negative.   Exam:   BP 110/64 (BP Location: Right Arm, Patient Position: Sitting, Cuff Size: Normal)   Pulse 68   Resp 16   Ht 4' 11.75" (1.518 m)   Wt 119 lb 12.8 oz (54.3 kg)   LMP 04/24/1997   BMI 23.59 kg/m    Height: 4' 11.75" (151.8 cm)  Ht Readings from Last 3 Encounters:  06/12/17 4' 11.75" (1.518 m)  12/01/16 5' (1.524 m)  06/06/16 5' (1.524 m)    General appearance: alert, cooperative and appears stated age Head: Normocephalic, without obvious abnormality, atraumatic Neck: no adenopathy, supple, symmetrical, trachea midline and thyroid normal to inspection and palpation Lungs: clear to auscultation bilaterally Breasts: surgically absent, no masses, no LAD, well healed scars Heart: regular rate and rhythm Abdomen: soft, non-tender; bowel sounds normal; no masses,  no organomegaly Extremities: extremities normal, atraumatic, no cyanosis or edema Skin: Skin color, texture, turgor normal. No rashes or lesions Lymph nodes: Cervical, supraclavicular, and axillary nodes normal. No abnormal inguinal nodes palpated Neurologic: Grossly normal  Pelvic: External genitalia:  no lesions              Urethra:  normal  appearing urethra with no masses, tenderness or lesions              Bartholins and Skenes: normal                 Vagina: normal appearing vagina with normal color and discharge, no lesions              Cervix: no lesions  Pap taken: No. Bimanual Exam:  Uterus:  normal size, contour, position, consistency, mobility, non-tender              Adnexa: normal adnexa and no mass, fullness, tenderness               Rectovaginal: Confirms               Anus:  normal sphincter tone, no lesions  Chaperone was present for exam.  A:  Well Woman with normal exam H/o breast cancer, s/p bilateral mastectomy H/O colectomy then colostomy takedown due to signification diverticular disease H/O BSO 2004 by Dr. Fermin Schwab H/O hyperlipidemia with elevated CK on oral statins and this is followed by Dr. Forde Dandy. H/o transient global amnesia that Dr. Forde Dandy felt was possibly TIA.  On 81 mg ASA. Hypothryoidism  P:   Mammogram not indicated pap smear with neg HR HPV 12/17.  Pap smear not obtained today. Sees Dr. Forde Dandy and does lab work.  Has upcoming appt.  return annually or prn

## 2017-07-07 DIAGNOSIS — H2513 Age-related nuclear cataract, bilateral: Secondary | ICD-10-CM | POA: Diagnosis not present

## 2017-08-10 DIAGNOSIS — M8580 Other specified disorders of bone density and structure, unspecified site: Secondary | ICD-10-CM | POA: Diagnosis not present

## 2017-08-10 DIAGNOSIS — E038 Other specified hypothyroidism: Secondary | ICD-10-CM | POA: Diagnosis not present

## 2017-08-10 DIAGNOSIS — R748 Abnormal levels of other serum enzymes: Secondary | ICD-10-CM | POA: Diagnosis not present

## 2017-08-14 DIAGNOSIS — N200 Calculus of kidney: Secondary | ICD-10-CM | POA: Diagnosis not present

## 2017-08-14 DIAGNOSIS — R748 Abnormal levels of other serum enzymes: Secondary | ICD-10-CM | POA: Diagnosis not present

## 2017-08-14 DIAGNOSIS — R7301 Impaired fasting glucose: Secondary | ICD-10-CM | POA: Diagnosis not present

## 2017-08-14 DIAGNOSIS — I779 Disorder of arteries and arterioles, unspecified: Secondary | ICD-10-CM | POA: Diagnosis not present

## 2017-08-29 ENCOUNTER — Ambulatory Visit: Payer: Medicare Other | Admitting: Obstetrics & Gynecology

## 2017-08-29 ENCOUNTER — Encounter

## 2017-08-31 DIAGNOSIS — M8589 Other specified disorders of bone density and structure, multiple sites: Secondary | ICD-10-CM | POA: Diagnosis not present

## 2017-08-31 DIAGNOSIS — M810 Age-related osteoporosis without current pathological fracture: Secondary | ICD-10-CM | POA: Diagnosis not present

## 2017-09-07 ENCOUNTER — Encounter: Payer: Self-pay | Admitting: Obstetrics & Gynecology

## 2017-10-07 DIAGNOSIS — Z23 Encounter for immunization: Secondary | ICD-10-CM | POA: Diagnosis not present

## 2017-10-26 DIAGNOSIS — M542 Cervicalgia: Secondary | ICD-10-CM | POA: Diagnosis not present

## 2017-10-26 DIAGNOSIS — Z6823 Body mass index (BMI) 23.0-23.9, adult: Secondary | ICD-10-CM | POA: Diagnosis not present

## 2017-10-30 ENCOUNTER — Other Ambulatory Visit: Payer: Self-pay | Admitting: Family Medicine

## 2017-10-30 DIAGNOSIS — M542 Cervicalgia: Secondary | ICD-10-CM

## 2017-11-03 ENCOUNTER — Ambulatory Visit
Admission: RE | Admit: 2017-11-03 | Discharge: 2017-11-03 | Disposition: A | Discharge status: Home / Self Care | Payer: Medicare Other | Source: Ambulatory Visit | Attending: Family Medicine | Admitting: Family Medicine

## 2017-11-03 DIAGNOSIS — M4802 Spinal stenosis, cervical region: Secondary | ICD-10-CM | POA: Diagnosis not present

## 2017-11-03 DIAGNOSIS — M542 Cervicalgia: Secondary | ICD-10-CM

## 2018-02-15 DIAGNOSIS — E559 Vitamin D deficiency, unspecified: Secondary | ICD-10-CM | POA: Diagnosis not present

## 2018-02-15 DIAGNOSIS — E7849 Other hyperlipidemia: Secondary | ICD-10-CM | POA: Diagnosis not present

## 2018-02-15 DIAGNOSIS — R748 Abnormal levels of other serum enzymes: Secondary | ICD-10-CM | POA: Diagnosis not present

## 2018-02-15 DIAGNOSIS — E785 Hyperlipidemia, unspecified: Secondary | ICD-10-CM | POA: Diagnosis not present

## 2018-02-15 DIAGNOSIS — E038 Other specified hypothyroidism: Secondary | ICD-10-CM | POA: Diagnosis not present

## 2018-02-24 DIAGNOSIS — S2691XA Contusion of heart, unspecified with or without hemopericardium, initial encounter: Secondary | ICD-10-CM

## 2018-02-24 HISTORY — DX: Contusion of heart, unspecified with or without hemopericardium, initial encounter: S26.91XA

## 2018-02-28 DIAGNOSIS — D485 Neoplasm of uncertain behavior of skin: Secondary | ICD-10-CM | POA: Diagnosis not present

## 2018-03-06 ENCOUNTER — Other Ambulatory Visit: Payer: Self-pay

## 2018-03-06 ENCOUNTER — Encounter (HOSPITAL_BASED_OUTPATIENT_CLINIC_OR_DEPARTMENT_OTHER): Payer: Self-pay | Admitting: Emergency Medicine

## 2018-03-06 ENCOUNTER — Emergency Department (HOSPITAL_BASED_OUTPATIENT_CLINIC_OR_DEPARTMENT_OTHER): Payer: Medicare Other

## 2018-03-06 ENCOUNTER — Inpatient Hospital Stay (HOSPITAL_BASED_OUTPATIENT_CLINIC_OR_DEPARTMENT_OTHER)
Admission: EM | Admit: 2018-03-06 | Discharge: 2018-03-08 | DRG: 280 | Disposition: A | Discharge status: Home / Self Care | Payer: Medicare Other | Attending: Cardiology | Admitting: Cardiology

## 2018-03-06 DIAGNOSIS — R7989 Other specified abnormal findings of blood chemistry: Secondary | ICD-10-CM | POA: Diagnosis not present

## 2018-03-06 DIAGNOSIS — I214 Non-ST elevation (NSTEMI) myocardial infarction: Secondary | ICD-10-CM

## 2018-03-06 DIAGNOSIS — Z853 Personal history of malignant neoplasm of breast: Secondary | ICD-10-CM | POA: Diagnosis not present

## 2018-03-06 DIAGNOSIS — S20219A Contusion of unspecified front wall of thorax, initial encounter: Secondary | ICD-10-CM | POA: Diagnosis not present

## 2018-03-06 DIAGNOSIS — I213 ST elevation (STEMI) myocardial infarction of unspecified site: Secondary | ICD-10-CM | POA: Diagnosis not present

## 2018-03-06 DIAGNOSIS — W2211XA Striking against or struck by driver side automobile airbag, initial encounter: Secondary | ICD-10-CM

## 2018-03-06 DIAGNOSIS — I5021 Acute systolic (congestive) heart failure: Secondary | ICD-10-CM

## 2018-03-06 DIAGNOSIS — E782 Mixed hyperlipidemia: Secondary | ICD-10-CM | POA: Diagnosis not present

## 2018-03-06 DIAGNOSIS — R748 Abnormal levels of other serum enzymes: Secondary | ICD-10-CM | POA: Diagnosis not present

## 2018-03-06 DIAGNOSIS — R079 Chest pain, unspecified: Secondary | ICD-10-CM

## 2018-03-06 DIAGNOSIS — R072 Precordial pain: Secondary | ICD-10-CM | POA: Diagnosis not present

## 2018-03-06 DIAGNOSIS — Y92410 Unspecified street and highway as the place of occurrence of the external cause: Secondary | ICD-10-CM

## 2018-03-06 DIAGNOSIS — E039 Hypothyroidism, unspecified: Secondary | ICD-10-CM | POA: Diagnosis present

## 2018-03-06 DIAGNOSIS — S299XXA Unspecified injury of thorax, initial encounter: Secondary | ICD-10-CM | POA: Diagnosis not present

## 2018-03-06 DIAGNOSIS — Z9013 Acquired absence of bilateral breasts and nipples: Secondary | ICD-10-CM

## 2018-03-06 DIAGNOSIS — I779 Disorder of arteries and arterioles, unspecified: Secondary | ICD-10-CM | POA: Diagnosis not present

## 2018-03-06 DIAGNOSIS — R7301 Impaired fasting glucose: Secondary | ICD-10-CM | POA: Diagnosis not present

## 2018-03-06 DIAGNOSIS — Z981 Arthrodesis status: Secondary | ICD-10-CM

## 2018-03-06 DIAGNOSIS — I5181 Takotsubo syndrome: Secondary | ICD-10-CM

## 2018-03-06 DIAGNOSIS — R51 Headache: Secondary | ICD-10-CM | POA: Diagnosis not present

## 2018-03-06 DIAGNOSIS — E785 Hyperlipidemia, unspecified: Secondary | ICD-10-CM | POA: Diagnosis not present

## 2018-03-06 DIAGNOSIS — S0990XA Unspecified injury of head, initial encounter: Secondary | ICD-10-CM | POA: Diagnosis not present

## 2018-03-06 DIAGNOSIS — N200 Calculus of kidney: Secondary | ICD-10-CM | POA: Diagnosis not present

## 2018-03-06 HISTORY — DX: Takotsubo syndrome: I51.81

## 2018-03-06 LAB — BASIC METABOLIC PANEL
Anion gap: 11 (ref 5–15)
BUN: 15 mg/dL (ref 8–23)
CO2: 22 mmol/L (ref 22–32)
Calcium: 9.8 mg/dL (ref 8.9–10.3)
Chloride: 105 mmol/L (ref 98–111)
Creatinine, Ser: 0.46 mg/dL (ref 0.44–1.00)
GFR calc Af Amer: >60 mL/min (ref >60)
GFR calc non Af Amer: >60 mL/min (ref >60)
Glucose, Bld: 99 mg/dL (ref 70–99)
Potassium: 4 mmol/L (ref 3.5–5.1)
Sodium: 138 mmol/L (ref 135–145)

## 2018-03-06 LAB — TROPONIN I
Troponin I: 0.9 ng/mL (ref <0.03)
Troponin I: 2.16 ng/mL (ref <0.03)

## 2018-03-06 LAB — CBC
HCT: 44.5 % (ref 36.0–46.0)
Hemoglobin: 14 g/dL (ref 12.0–15.0)
MCH: 29.2 pg (ref 26.0–34.0)
MCHC: 31.5 g/dL (ref 30.0–36.0)
MCV: 92.7 fL (ref 80.0–100.0)
Platelets: 252 10*3/uL (ref 150–400)
RBC: 4.8 MIL/uL (ref 3.87–5.11)
RDW: 12.1 % (ref 11.5–15.5)
WBC: 9.8 10*3/uL (ref 4.0–10.5)
nRBC: 0 % (ref 0.0–0.2)

## 2018-03-06 MED ORDER — HEPARIN (PORCINE) 25000 UT/250ML-% IV SOLN
700.0000 [IU]/h | INTRAVENOUS | Status: DC
  Administered 2018-03-06: 650 [IU]/h via INTRAVENOUS
  Filled 2018-03-06: qty 250

## 2018-03-06 MED ORDER — ASPIRIN EC 81 MG PO TBEC
81.0000 mg | DELAYED_RELEASE_TABLET | Freq: Every day | ORAL | Status: DC
  Filled 2018-03-06: qty 1

## 2018-03-06 MED ORDER — ASPIRIN 81 MG PO CHEW
324.0000 mg | CHEWABLE_TABLET | Freq: Once | ORAL | Status: AC
  Administered 2018-03-06: 324 mg via ORAL
  Filled 2018-03-06: qty 4

## 2018-03-06 MED ORDER — HEPARIN BOLUS VIA INFUSION
3000.0000 [IU] | Freq: Once | INTRAVENOUS | Status: AC
  Administered 2018-03-06: 3000 [IU] via INTRAVENOUS

## 2018-03-06 MED ORDER — ONDANSETRON HCL 4 MG/2ML IJ SOLN: 4.0000 mg | Freq: Four times a day (QID) | INTRAMUSCULAR | Status: DC | PRN

## 2018-03-06 MED ORDER — NITROGLYCERIN 0.4 MG SL SUBL
0.4000 mg | SUBLINGUAL_TABLET | SUBLINGUAL | Status: DC | PRN
  Administered 2018-03-07: 0.8 mg via SUBLINGUAL

## 2018-03-06 MED ORDER — ACETAMINOPHEN 325 MG PO TABS: 650.0000 mg | ORAL_TABLET | ORAL | Status: DC | PRN

## 2018-03-06 MED ORDER — IOPAMIDOL (ISOVUE-300) INJECTION 61%
100.0000 mL | Freq: Once | INTRAVENOUS | Status: AC | PRN
  Administered 2018-03-06: 100 mL via INTRAVENOUS
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
## 2018-03-06 NOTE — ED Notes (Signed)
ED Provider at bedside. 
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
## 2018-03-06 NOTE — ED Notes (Signed)
Report to [facility].  

## 2018-03-06 NOTE — ED Notes (Signed)
[facility] notified (Crystal Lloyd) - patient ready for transport 

## 2018-03-06 NOTE — Progress Notes (Signed)
Provider on call notified of pts arrival via [place].Jessie Foot, RN
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
## 2018-03-06 NOTE — ED Notes (Signed)
[facility] ([place]) - [phone]

## 2018-03-06 NOTE — ED Notes (Signed)
Paged Dr. Harrell Gave

## 2018-03-06 NOTE — ED Notes (Signed)
Patient transported to X-ray 
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
## 2018-03-07 ENCOUNTER — Inpatient Hospital Stay (HOSPITAL_COMMUNITY): Payer: Medicare Other

## 2018-03-07 ENCOUNTER — Encounter (HOSPITAL_COMMUNITY)
Admission: EM | Disposition: A | Discharge status: Home / Self Care | Payer: Self-pay | Source: Home / Self Care | Attending: Cardiology

## 2018-03-07 DIAGNOSIS — R7989 Other specified abnormal findings of blood chemistry: Secondary | ICD-10-CM

## 2018-03-07 DIAGNOSIS — R079 Chest pain, unspecified: Secondary | ICD-10-CM

## 2018-03-07 DIAGNOSIS — E782 Mixed hyperlipidemia: Secondary | ICD-10-CM

## 2018-03-07 DIAGNOSIS — R072 Precordial pain: Secondary | ICD-10-CM

## 2018-03-07 LAB — TROPONIN I
Troponin I: 0.67 ng/mL (ref <0.03)
Troponin I: 1.33 ng/mL (ref <0.03)
Troponin I: 1.9 ng/mL (ref <0.03)

## 2018-03-07 LAB — BASIC METABOLIC PANEL
Anion gap: 8 (ref 5–15)
BUN: 15 mg/dL (ref 8–23)
CO2: 28 mmol/L (ref 22–32)
Calcium: 9.7 mg/dL (ref 8.9–10.3)
Chloride: 106 mmol/L (ref 98–111)
Creatinine, Ser: 0.76 mg/dL (ref 0.44–1.00)
GFR calc Af Amer: >60 mL/min (ref >60)
GFR calc non Af Amer: >60 mL/min (ref >60)
Glucose, Bld: 102 mg/dL — ABNORMAL HIGH (ref 70–99)
Potassium: 3.8 mmol/L (ref 3.5–5.1)
Sodium: 142 mmol/L (ref 135–145)

## 2018-03-07 LAB — HEPARIN LEVEL (UNFRACTIONATED): Heparin Unfractionated: 0.29 IU/mL — ABNORMAL LOW (ref 0.30–0.70)

## 2018-03-07 LAB — CBC
HCT: 42.1 % (ref 36.0–46.0)
Hemoglobin: 13.7 g/dL (ref 12.0–15.0)
MCH: 29.6 pg (ref 26.0–34.0)
MCHC: 32.5 g/dL (ref 30.0–36.0)
MCV: 90.9 fL (ref 80.0–100.0)
Platelets: 245 10*3/uL (ref 150–400)
RBC: 4.63 MIL/uL (ref 3.87–5.11)
RDW: 12.2 % (ref 11.5–15.5)
WBC: 7.2 10*3/uL (ref 4.0–10.5)
nRBC: 0 % (ref 0.0–0.2)

## 2018-03-07 LAB — HIV ANTIBODY (ROUTINE TESTING W REFLEX): HIV Screen 4th Generation wRfx: NONREACTIVE

## 2018-03-07 LAB — ECHOCARDIOGRAM COMPLETE
Height: 60 in
Weight: 1859.2 oz

## 2018-03-07 LAB — PROTIME-INR
INR: 1.08
Prothrombin Time: 13.9 seconds (ref 11.4–15.2)

## 2018-03-07 SURGERY — LEFT HEART CATH AND CORONARY ANGIOGRAPHY
Anesthesia: LOCAL

## 2018-03-07 MED ORDER — SODIUM CHLORIDE 0.9 % IV SOLN: 250.0000 mL | INTRAVENOUS | Status: DC | PRN

## 2018-03-07 MED ORDER — SODIUM CHLORIDE 0.9% FLUSH
3.0000 mL | Freq: Two times a day (BID) | INTRAVENOUS | Status: DC
  Administered 2018-03-08: 3 mL via INTRAVENOUS

## 2018-03-07 MED ORDER — SODIUM CHLORIDE 0.9% FLUSH: 10.0000 mL | INTRAVENOUS | Status: DC | PRN

## 2018-03-07 MED ORDER — NITROGLYCERIN 0.4 MG SL SUBL
SUBLINGUAL_TABLET | SUBLINGUAL | Status: AC
  Administered 2018-03-07: 0.8 mg via SUBLINGUAL
  Filled 2018-03-07: qty 1

## 2018-03-07 MED ORDER — SODIUM CHLORIDE 0.9% FLUSH: 10.0000 mL | Freq: Two times a day (BID) | INTRAVENOUS | Status: DC

## 2018-03-07 MED ORDER — PERFLUTREN LIPID MICROSPHERE
1.0000 mL | INTRAVENOUS | Status: AC | PRN
  Administered 2018-03-07: 4 mL via INTRAVENOUS
  Filled 2018-03-07: qty 10

## 2018-03-07 MED ORDER — SODIUM CHLORIDE 0.9% FLUSH: 3.0000 mL | INTRAVENOUS | Status: DC | PRN

## 2018-03-07 MED ORDER — IOPAMIDOL (ISOVUE-370) INJECTION 76%
80.0000 mL | Freq: Once | INTRAVENOUS | Status: AC | PRN
  Administered 2018-03-07: 80 mL via INTRAVENOUS

## 2018-03-07 MED ORDER — SODIUM CHLORIDE 0.9 % WEIGHT BASED INFUSION: 3.0000 mL/kg/h | INTRAVENOUS | Status: AC

## 2018-03-07 MED ORDER — ASPIRIN 81 MG PO CHEW
81.0000 mg | CHEWABLE_TABLET | ORAL | Status: AC
  Administered 2018-03-07: 81 mg via ORAL
  Filled 2018-03-07: qty 1

## 2018-03-07 MED ORDER — SODIUM CHLORIDE 0.9 % WEIGHT BASED INFUSION
1.0000 mL/kg/h | INTRAVENOUS | Status: DC
  Administered 2018-03-07: 1 mL/kg/h via INTRAVENOUS
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
## 2018-03-07 NOTE — Progress Notes (Signed)
Midline attempt: Unable to advance guidewire through needle. Site unremarkable. Pressure dressing applied.
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
## 2018-03-07 NOTE — Progress Notes (Signed)
  Echocardiogram 2D Echocardiogram has been performed with Definity.  Crystal Lloyd 03/07/2018, 10:25 AM

## 2018-03-08 ENCOUNTER — Other Ambulatory Visit: Payer: Self-pay

## 2018-03-08 DIAGNOSIS — I5021 Acute systolic (congestive) heart failure: Secondary | ICD-10-CM

## 2018-03-08 LAB — BASIC METABOLIC PANEL
Anion gap: 9 (ref 5–15)
BUN: 13 mg/dL (ref 8–23)
CO2: 25 mmol/L (ref 22–32)
Calcium: 9.9 mg/dL (ref 8.9–10.3)
Chloride: 106 mmol/L (ref 98–111)
Creatinine, Ser: 0.62 mg/dL (ref 0.44–1.00)
GFR calc Af Amer: >60 mL/min (ref >60)
GFR calc non Af Amer: >60 mL/min (ref >60)
Glucose, Bld: 85 mg/dL (ref 70–99)
Potassium: 4 mmol/L (ref 3.5–5.1)
Sodium: 140 mmol/L (ref 135–145)

## 2018-03-08 MED ORDER — METOPROLOL SUCCINATE ER 25 MG PO TB24
12.5000 mg | ORAL_TABLET | Freq: Every day | ORAL | Status: DC
  Administered 2018-03-08: 12.5 mg via ORAL
  Filled 2018-03-08: qty 1

## 2018-03-08 MED ORDER — LISINOPRIL 2.5 MG PO TABS: 2.5000 mg | ORAL_TABLET | Freq: Every day | ORAL | 11 refills | Status: DC

## 2018-03-08 MED ORDER — LISINOPRIL 2.5 MG PO TABS
2.5000 mg | ORAL_TABLET | Freq: Every day | ORAL | Status: DC
  Administered 2018-03-08: 2.5 mg via ORAL
  Filled 2018-03-08: qty 1

## 2018-03-08 MED ORDER — ICOSAPENT ETHYL 1 G PO CAPS: 2.0000 g | ORAL_CAPSULE | Freq: Two times a day (BID) | ORAL | 1 refills | Status: DC

## 2018-03-08 MED ORDER — METOPROLOL SUCCINATE ER 25 MG PO TB24: 12.5000 mg | ORAL_TABLET | Freq: Every day | ORAL | 11 refills | Status: DC

## 2018-03-08 MED FILL — LISINOPRIL 2.5 MG TABLET: 2.5 | 30 days supply | Qty: 30 | Fill #0 | Status: TO

## 2018-03-08 MED FILL — METOPROLOL SUCCINATE ER 25: 25 | 30 days supply | Qty: 15 | Fill #0 | Status: TO

## 2018-03-08 NOTE — Progress Notes (Signed)
Patient received discharge information and acknowledged understanding of it. Patient received medications from pharmacy. Patient IV was removed.
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
## 2018-03-08 NOTE — Progress Notes (Signed)
Nutrition Education Note  RD consulted for nutrition education regarding a Heart Healthy, low sodium diet.   Lipid Panel     Component Value Date/Time   CHOL 208 (H) 01/25/2014 0400   TRIG 247 (H) 01/25/2014 0400   HDL 55 01/25/2014 0400   CHOLHDL 3.8 01/25/2014 0400   VLDL 49 (H) 01/25/2014 0400   LDLCALC 104 (H) 01/25/2014 0400    RD provided "Heart Healthy Low Sodium Nutrition Therapy" handout from the Academy of Nutrition and Dietetics. Reviewed patient's dietary recall. Provided examples on ways to decrease sodium and fat intake in diet. Discouraged intake of processed foods and use of salt shaker. Encouraged fresh fruits and vegetables as well as whole grain sources of carbohydrates to maximize fiber intake. Teach back method used.  Expect good compliance.  Body mass index is 22.67 kg/m. Pt meets criteria for normal weight based on current BMI.  Patient being discharged today. No further nutrition interventions warranted at this time. RD contact information provided. If additional nutrition issues arise, please re-consult RD.  Molli Barrows, RD, LDN, [place] Pager [phone] After Hours Pager [phone]

## 2018-03-12 ENCOUNTER — Telehealth: Payer: Self-pay

## 2018-03-12 NOTE — Telephone Encounter (Signed)
**Note De-Identified Crystal Lloyd Obfuscation** I have done a Vascepa PA through covermymeds. Key: [id number]

## 2018-03-13 NOTE — Progress Notes (Signed)
Transitions of Care Follow Up Call Note  Crystal Lloyd is an 73 y.o. female who presented to [facility] on 03/06/2018.  The patient had the following prescriptions filled at [place]: Metoprolol, lisinopril  Patient was called by pharmacist and HIPAA identifiers were verified. The following questions were asked about the prescriptions filled at [place]:  Has the patient been experiencing any side effects to the medications prescribed? no Understanding of regimen: good Understanding of indications: good Potential of compliance: good   [x]  Patient's prescriptions filled at the [facility] Transitions of Care Pharmacy were transferred to the following pharmacy: [facility] []  Patient unable to be reached after calling three times and prescriptions filled at the [facility] Transitions of Care Pharmacy were transferred to preferred pharmacy found within their chart.   Ignatius Specking [name] 03/13/2018, 5:10 PM Transitions of Care Pharmacy Hours: Monday - Friday 8:30am to 5:00 PM  Phone - [phone]

## 2018-03-13 NOTE — Telephone Encounter (Signed)
Call received from [facility] with [facility]. Per Elta Guadeloupe the pts Vascepa PA has been approved from today until 03/13/2019.  I have notified Abigail Butts at [facility] of this approval.

## 2018-03-16 DIAGNOSIS — R748 Abnormal levels of other serum enzymes: Secondary | ICD-10-CM

## 2018-03-16 HISTORY — DX: Abnormal levels of other serum enzymes: R74.8

## 2018-03-19 DIAGNOSIS — I429 Cardiomyopathy, unspecified: Secondary | ICD-10-CM | POA: Insufficient documentation

## 2018-03-19 DIAGNOSIS — R931 Abnormal findings on diagnostic imaging of heart and coronary circulation: Secondary | ICD-10-CM | POA: Insufficient documentation
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
## 2018-03-20 ENCOUNTER — Ambulatory Visit: Payer: Medicare Other | Admitting: Physician Assistant

## 2018-03-20 ENCOUNTER — Encounter: Payer: Self-pay | Admitting: Physician Assistant

## 2018-03-20 VITALS — BP 118/58 | HR 53 | Ht 60.0 in | Wt 122.8 lb

## 2018-03-20 DIAGNOSIS — I214 Non-ST elevation (NSTEMI) myocardial infarction: Secondary | ICD-10-CM | POA: Diagnosis not present

## 2018-03-20 DIAGNOSIS — I5181 Takotsubo syndrome: Secondary | ICD-10-CM

## 2018-03-20 DIAGNOSIS — R931 Abnormal findings on diagnostic imaging of heart and coronary circulation: Secondary | ICD-10-CM | POA: Diagnosis not present

## 2018-03-20 NOTE — Patient Instructions (Addendum)
Medication Instructions:  Your physician recommends that you continue on your current medications as directed. Please refer to the Current Medication list given to you today.  If you need a refill on your cardiac medications before your next appointment, please call your pharmacy.   Lab work: None Ordered  If you have labs (blood work) drawn today and your tests are completely normal, you will receive your results only by: Marland Kitchen MyChart Message (if you have MyChart) OR . A paper copy in the mail If you have any lab test that is abnormal or we need to change your treatment, we will call you to review the results.  Testing/Procedures: Your physician has requested that you have an echocardiogram on 06/13/18 at 9:30 AM, arrive at 9:00 AM. Echocardiography is a painless test that uses sound waves to create images of your heart. It provides your doctor with information about the size and shape of your heart and how well your heart's chambers and valves are working. This procedure takes approximately one hour. There are no restrictions for this procedure.    Follow-Up: . Follow up with Dr. Irish Lack on 06/13/18 at 11:00 AM . Follow up in the [facility] on 06/13/18 at 11:30 AM  Any Other Special Instructions Will Be Listed Below (If Applicable).

## 2018-04-11 DIAGNOSIS — D485 Neoplasm of uncertain behavior of skin: Secondary | ICD-10-CM | POA: Diagnosis not present

## 2018-04-12 ENCOUNTER — Other Ambulatory Visit: Payer: Self-pay | Admitting: Plastic Surgery

## 2018-04-12 DIAGNOSIS — L989 Disorder of the skin and subcutaneous tissue, unspecified: Secondary | ICD-10-CM | POA: Diagnosis not present

## 2018-04-12 DIAGNOSIS — D2239 Melanocytic nevi of other parts of face: Secondary | ICD-10-CM | POA: Diagnosis not present

## 2018-05-23 ENCOUNTER — Ambulatory Visit: Payer: Medicare Other

## 2018-05-23 ENCOUNTER — Other Ambulatory Visit (HOSPITAL_COMMUNITY): Payer: Medicare Other

## 2018-05-23 ENCOUNTER — Ambulatory Visit: Payer: Medicare Other | Admitting: Interventional Cardiology

## 2018-05-24 ENCOUNTER — Telehealth: Payer: Self-pay | Admitting: Pharmacist

## 2018-05-24 ENCOUNTER — Ambulatory Visit: Payer: Medicare Other | Admitting: Interventional Cardiology

## 2018-05-24 DIAGNOSIS — R05 Cough: Secondary | ICD-10-CM | POA: Diagnosis not present

## 2018-05-24 DIAGNOSIS — Z20818 Contact with and (suspected) exposure to other bacterial communicable diseases: Secondary | ICD-10-CM | POA: Diagnosis not present

## 2018-05-24 DIAGNOSIS — J029 Acute pharyngitis, unspecified: Secondary | ICD-10-CM | POA: Diagnosis not present
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
## 2018-05-24 NOTE — Telephone Encounter (Signed)
Patient returned call. States she does not need visit with Pharmacist. 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 She wanted me to confirm her appointment with Dr. Irish Lack on 06/13/18. Advised that is was still scheduled, but I could not confirm how they were going to conduct the visit. Advised that they should be calling her, they are working on scrubbing their schedules, but unsure how far out they have gotten. Patient states she understands as their practice is doing the same thing. She will wait to hear from them. I have canceled her visit with the PharmD. No need for virtual visit per patient.

## 2018-06-08 ENCOUNTER — Telehealth: Payer: Self-pay | Admitting: Interventional Cardiology

## 2018-06-08 NOTE — Telephone Encounter (Signed)
Dr. Isaiah Blakes called this afternoon to confirm that her Echo at 9:15 on 05/20 and 11:00 Office Visit with Dr. Irish Lack will still be scheduled as planned. She also wanted to make sure Dr. Irish Lack will have enough time to get the results from her echo for Dr. Clayton Bibles to go ober with her at her appt

## 2018-06-08 NOTE — Telephone Encounter (Signed)
I spoke to Dr Isaiah Blakes about her appointment next week for Echo, along with Office Visit with Dr Irish Lack.  She was thankful for the call.

## 2018-06-11 ENCOUNTER — Telehealth (HOSPITAL_COMMUNITY): Payer: Self-pay

## 2018-06-11 ENCOUNTER — Telehealth: Payer: Self-pay

## 2018-06-11 NOTE — Telephone Encounter (Signed)
-----   Message from Jettie Booze, MD sent at 06/07/2018  9:58 AM EDT ----- Regarding: RE: APPT Sounds good.  That way we can discuss echo results.  JV ----- Message ----- From: Cleon Gustin, RN Sent: 06/06/2018   5:43 PM EDT To: Jettie Booze, MD Subject: APPT                                           Patient is scheduled for an echo and an OV with you on 5/20. They have not rescheduled her echo. Just wanted to see if you wanted me to arrange for Virtual Visit on another day after her echo since you are not DOD that day? Thanks

## 2018-06-11 NOTE — Telephone Encounter (Signed)
Left message for patient to call back. Patient will need to keep echo appt on 5/20 but her OV appt needs to be cancelled and rescheduled for a video visit with [email or web address]. Consent will need to be obtained. Dr. Irish Lack has an opening on 5/22 at 10:00 AM.

## 2018-06-11 NOTE — Telephone Encounter (Signed)
LMTCB COVID prescreening for echo. Left detailed message for echo.

## 2018-06-12 ENCOUNTER — Telehealth (HOSPITAL_COMMUNITY): Payer: Self-pay | Admitting: *Deleted
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
## 2018-06-12 NOTE — Telephone Encounter (Signed)

## 2018-06-12 NOTE — Telephone Encounter (Signed)
Left message for patient to call back. Patient will need to keep echo appt on 5/20 but her OV appt needs to be cancelled and rescheduled for a video visit with [email or web address]. Consent will need to be obtained. Dr. Irish Lack has an opening on 5/22 at 10:00 AM.

## 2018-06-13 ENCOUNTER — Other Ambulatory Visit: Payer: Self-pay

## 2018-06-13 ENCOUNTER — Ambulatory Visit (HOSPITAL_COMMUNITY): Payer: Medicare Other | Attending: Cardiovascular Disease

## 2018-06-13 ENCOUNTER — Encounter (INDEPENDENT_AMBULATORY_CARE_PROVIDER_SITE_OTHER): Payer: Self-pay

## 2018-06-13 ENCOUNTER — Ambulatory Visit: Payer: Medicare Other | Admitting: Interventional Cardiology

## 2018-06-13 ENCOUNTER — Ambulatory Visit: Payer: Medicare Other

## 2018-06-13 DIAGNOSIS — I5181 Takotsubo syndrome: Secondary | ICD-10-CM | POA: Diagnosis not present

## 2018-06-13 DIAGNOSIS — I214 Non-ST elevation (NSTEMI) myocardial infarction: Secondary | ICD-10-CM

## 2018-06-14 NOTE — Telephone Encounter (Signed)
Late entry:  Contacted patient on 5/19. Rescheduled visit with Dr. Irish Lack to 5/22 video visit. Patient will keep echo 5/20.      Virtual Visit Pre-Appointment Phone Call TELEPHONE CALL NOTE  Crystal Lloyd has been deemed a candidate for a follow-up tele-health visit to limit community exposure during the Covid-19 pandemic. I spoke with the patient via phone to ensure availability of phone/video source, confirm preferred email & phone number, and discuss instructions and expectations.  I reminded Crystal Lloyd to be prepared with any vital sign and/or heart rhythm information that could potentially be obtained via home monitoring, at the time of her visit. I reminded Crystal Lloyd to expect a phone call prior to her visit.  Patient agrees to consent below.  FULL LENGTH CONSENT FOR TELE-HEALTH VISIT   I hereby voluntarily request, consent and authorize [facility] and its employed or contracted physicians, physician assistants, nurse practitioners or other licensed health care professionals (the Practitioner), to provide me with telemedicine health care services (the "Services") as deemed necessary by the treating Practitioner. I acknowledge and consent to receive the Services by the Practitioner via telemedicine. I understand that the telemedicine visit will involve communicating with the Practitioner through live audiovisual communication technology and the disclosure of certain medical information by electronic transmission. I acknowledge that I have been given the opportunity to request an in-person assessment or other available alternative prior to the telemedicine visit and am voluntarily participating in the telemedicine visit.  I understand that I have the right to withhold or withdraw my consent to the use of telemedicine in the course of my care at any time, without affecting my right to future care or treatment, and that the Practitioner or I may terminate the  telemedicine visit at any time. I understand that I have the right to inspect all information obtained and/or recorded in the course of the telemedicine visit and may receive copies of available information for a reasonable fee.  I understand that some of the potential risks of receiving the Services via telemedicine include:  Marland Kitchen Delay or interruption in medical evaluation due to technological equipment failure or disruption; . Information transmitted may not be sufficient (e.g. poor resolution of images) to allow for appropriate medical decision making by the Practitioner; and/or  . In rare instances, security protocols could fail, causing a breach of personal health information.  Furthermore, I acknowledge that it is my responsibility to provide information about my medical history, conditions and care that is complete and accurate to the best of my ability. I acknowledge that Practitioner's advice, recommendations, and/or decision may be based on factors not within their control, such as incomplete or inaccurate data provided by me or distortions of diagnostic images or specimens that may result from electronic transmissions. I understand that the practice of medicine is not an exact science and that Practitioner makes no warranties or guarantees regarding treatment outcomes. I acknowledge that I will receive a copy of this consent concurrently upon execution via email to the email address I last provided but may also request a printed copy by calling the office of [facility].    I understand that my insurance will be billed for this visit.   I have read or had this consent read to me. . I understand the contents of this consent, which adequately explains the benefits and risks of the Services being provided via telemedicine.  . I have been provided ample opportunity to ask questions regarding this consent  and the Services and have had my questions answered to my satisfaction. . I give my informed  consent for the services to be provided through the use of telemedicine in my medical care  By participating in this telemedicine visit I agree to the above.

## 2018-06-15 ENCOUNTER — Telehealth (INDEPENDENT_AMBULATORY_CARE_PROVIDER_SITE_OTHER): Payer: Medicare Other | Admitting: Interventional Cardiology

## 2018-06-15 ENCOUNTER — Encounter: Payer: Self-pay | Admitting: Interventional Cardiology

## 2018-06-15 ENCOUNTER — Other Ambulatory Visit: Payer: Self-pay

## 2018-06-15 DIAGNOSIS — I5181 Takotsubo syndrome: Secondary | ICD-10-CM | POA: Diagnosis not present

## 2018-06-15 DIAGNOSIS — E782 Mixed hyperlipidemia: Secondary | ICD-10-CM

## 2018-06-15 DIAGNOSIS — L570 Actinic keratosis: Secondary | ICD-10-CM | POA: Diagnosis not present

## 2018-06-15 NOTE — Patient Instructions (Signed)
Medication Instructions:  Your physician has recommended you make the following change in your medication:   1. STOP: metoprolol  2. If you are feeling okay a week after stopping metoprolol, you may stop your lisinopril   Lab work: None Ordered  If you have labs (blood work) drawn today and your tests are completely normal, you will receive your results only by: Marland Kitchen MyChart Message (if you have MyChart) OR . A paper copy in the mail If you have any lab test that is abnormal or we need to change your treatment, we will call you to review the results.  Testing/Procedures: None ordered  Follow-Up: Follow up with Dr.Varanasi via VIDEO Visit on   Any Other Special Instructions Will Be Listed Below (If Applicable).
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
## 2018-06-20 ENCOUNTER — Telehealth: Payer: Self-pay | Admitting: Obstetrics & Gynecology

## 2018-06-20 ENCOUNTER — Other Ambulatory Visit: Payer: Self-pay

## 2018-06-20 NOTE — Telephone Encounter (Signed)
Patient covid prescreen positive. States she has a "nagging cough" due to allergies. Patient was tested for covid two weeks ago by PCP; test was negative.

## 2018-06-21 NOTE — Telephone Encounter (Signed)
Left message to call Eylin Pontarelli, RN at [facility] [phone].   

## 2018-06-25 ENCOUNTER — Other Ambulatory Visit: Payer: Self-pay

## 2018-06-25 ENCOUNTER — Ambulatory Visit (INDEPENDENT_AMBULATORY_CARE_PROVIDER_SITE_OTHER): Payer: Medicare Other | Admitting: Obstetrics & Gynecology

## 2018-06-25 ENCOUNTER — Encounter: Payer: Self-pay | Admitting: Obstetrics & Gynecology

## 2018-06-25 ENCOUNTER — Other Ambulatory Visit (HOSPITAL_COMMUNITY)
Admission: RE | Admit: 2018-06-25 | Discharge: 2018-06-25 | Disposition: A | Discharge status: Home / Self Care | Payer: Medicare Other | Source: Ambulatory Visit | Attending: Obstetrics & Gynecology | Admitting: Obstetrics & Gynecology

## 2018-06-25 VITALS — BP 118/70 | HR 68 | Temp 97.4°F | Ht 59.5 in | Wt 118.0 lb

## 2018-06-25 DIAGNOSIS — Z01419 Encounter for gynecological examination (general) (routine) without abnormal findings: Secondary | ICD-10-CM

## 2018-06-25 DIAGNOSIS — Z124 Encounter for screening for malignant neoplasm of cervix: Secondary | ICD-10-CM | POA: Diagnosis not present
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
## 2018-06-25 NOTE — Telephone Encounter (Signed)
Patient is returning a call to Jill. °

## 2018-06-25 NOTE — Telephone Encounter (Signed)
Spoke with patient. Patient is scheduled for AEX today with Dr. Sabra Heck, returned call for additional Covid 19 prescreen. Patient reports a non-productive cough that has been present for 5 yrs. Denies any other upper respiratory symptoms, fever/chills. [id number] testing negative 2 wks ago. Patient will keep AEX as scheduled for today at 1pm with Dr. Sabra Heck.   Routing to provider for final review. Patient is agreeable to disposition. Will close encounter.

## 2018-06-26 DIAGNOSIS — Z20828 Contact with and (suspected) exposure to other viral communicable diseases: Secondary | ICD-10-CM | POA: Diagnosis not present

## 2018-06-27 LAB — CYTOLOGY - PAP: Diagnosis: NEGATIVE

## 2018-07-18 ENCOUNTER — Telehealth: Payer: Self-pay

## 2018-07-18 NOTE — Telephone Encounter (Signed)
Called pt to do covid 19 prescreen questions. If pt calls back please complete screening if possible. Thank you .[email or web address] is the phrase needed.

## 2018-07-18 NOTE — Telephone Encounter (Signed)

## 2018-07-18 NOTE — Telephone Encounter (Signed)
Patient returned your call.

## 2018-07-20 ENCOUNTER — Telehealth: Payer: Self-pay

## 2018-07-20 NOTE — Telephone Encounter (Signed)
Called pt to see if they would like to switch to OV instead of virtual. Left message asking pt to call the office.
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
## 2018-07-23 ENCOUNTER — Telehealth (INDEPENDENT_AMBULATORY_CARE_PROVIDER_SITE_OTHER): Payer: Medicare Other | Admitting: Interventional Cardiology

## 2018-07-23 ENCOUNTER — Encounter: Payer: Self-pay | Admitting: Interventional Cardiology

## 2018-07-23 ENCOUNTER — Other Ambulatory Visit: Payer: Self-pay

## 2018-07-23 VITALS — BP 120/71 | HR 59 | Ht 59.5 in | Wt 118.0 lb

## 2018-07-23 DIAGNOSIS — I5181 Takotsubo syndrome: Secondary | ICD-10-CM

## 2018-07-23 DIAGNOSIS — R931 Abnormal findings on diagnostic imaging of heart and coronary circulation: Secondary | ICD-10-CM | POA: Diagnosis not present

## 2018-07-23 DIAGNOSIS — E782 Mixed hyperlipidemia: Secondary | ICD-10-CM | POA: Diagnosis not present

## 2018-07-23 NOTE — Patient Instructions (Signed)
Medication Instructions:  Your physician recommends that you continue on your current medications as directed. Please refer to the Current Medication list given to you today.  If you need a refill on your cardiac medications before your next appointment, please call your pharmacy.   Lab work:  None ordered today  Testing/Procedures:  None ordered today  Follow-Up: At [facility], you and your health needs are our priority.  As part of our continuing mission to provide you with exceptional heart care, we have created designated Provider Care Teams.  These Care Teams include your primary Cardiologist (physician) and Advanced Practice Providers (APPs -  Physician Assistants and Nurse Practitioners) who all work together to provide you with the care you need, when you need it. You will need a follow up appointment in 12 months.  Please call our office 2 months in advance to schedule this appointment.  You may see Larae Grooms, MD or one of the following Advanced Practice Providers on your designated Care Team:   [place], PA-C Melina Copa, PA-C . Ermalinda Barrios, PA-C

## 2018-08-30 DIAGNOSIS — H524 Presbyopia: Secondary | ICD-10-CM | POA: Diagnosis not present

## 2018-09-03 DIAGNOSIS — M859 Disorder of bone density and structure, unspecified: Secondary | ICD-10-CM | POA: Diagnosis not present

## 2018-09-03 DIAGNOSIS — E7849 Other hyperlipidemia: Secondary | ICD-10-CM | POA: Diagnosis not present

## 2018-09-03 DIAGNOSIS — R748 Abnormal levels of other serum enzymes: Secondary | ICD-10-CM | POA: Diagnosis not present

## 2018-09-03 DIAGNOSIS — E038 Other specified hypothyroidism: Secondary | ICD-10-CM | POA: Diagnosis not present

## 2018-09-05 DIAGNOSIS — N200 Calculus of kidney: Secondary | ICD-10-CM | POA: Diagnosis not present

## 2018-09-05 DIAGNOSIS — I5181 Takotsubo syndrome: Secondary | ICD-10-CM | POA: Diagnosis not present

## 2018-09-05 DIAGNOSIS — M48061 Spinal stenosis, lumbar region without neurogenic claudication: Secondary | ICD-10-CM | POA: Diagnosis not present

## 2018-09-05 DIAGNOSIS — E782 Mixed hyperlipidemia: Secondary | ICD-10-CM | POA: Diagnosis not present

## 2018-09-21 DIAGNOSIS — E785 Hyperlipidemia, unspecified: Secondary | ICD-10-CM | POA: Diagnosis not present

## 2018-10-09 DIAGNOSIS — C50912 Malignant neoplasm of unspecified site of left female breast: Secondary | ICD-10-CM | POA: Diagnosis not present

## 2018-10-09 DIAGNOSIS — Z9011 Acquired absence of right breast and nipple: Secondary | ICD-10-CM | POA: Diagnosis not present

## 2018-10-12 ENCOUNTER — Other Ambulatory Visit: Payer: Self-pay | Admitting: Endocrinology

## 2018-10-12 DIAGNOSIS — C50919 Malignant neoplasm of unspecified site of unspecified female breast: Secondary | ICD-10-CM

## 2018-10-12 DIAGNOSIS — C210 Malignant neoplasm of anus, unspecified: Secondary | ICD-10-CM

## 2018-10-14 ENCOUNTER — Ambulatory Visit
Admission: RE | Admit: 2018-10-14 | Discharge: 2018-10-14 | Disposition: A | Discharge status: Home / Self Care | Payer: Medicare Other | Source: Ambulatory Visit | Attending: Endocrinology | Admitting: Endocrinology

## 2018-10-14 ENCOUNTER — Other Ambulatory Visit: Payer: Self-pay

## 2018-10-14 DIAGNOSIS — M48061 Spinal stenosis, lumbar region without neurogenic claudication: Secondary | ICD-10-CM | POA: Diagnosis not present

## 2018-10-14 DIAGNOSIS — C50919 Malignant neoplasm of unspecified site of unspecified female breast: Secondary | ICD-10-CM

## 2018-10-14 DIAGNOSIS — C210 Malignant neoplasm of anus, unspecified: Secondary | ICD-10-CM

## 2018-10-14 MED ORDER — GADOBENATE DIMEGLUMINE 529 MG/ML IV SOLN
10.0000 mL | Freq: Once | INTRAVENOUS | Status: AC | PRN
  Administered 2018-10-14: 10 mL via INTRAVENOUS

## 2018-10-16 DIAGNOSIS — Z9011 Acquired absence of right breast and nipple: Secondary | ICD-10-CM | POA: Diagnosis not present

## 2018-10-16 DIAGNOSIS — C50912 Malignant neoplasm of unspecified site of left female breast: Secondary | ICD-10-CM | POA: Diagnosis not present

## 2018-10-17 DIAGNOSIS — Z9011 Acquired absence of right breast and nipple: Secondary | ICD-10-CM | POA: Diagnosis not present

## 2018-10-17 DIAGNOSIS — C50912 Malignant neoplasm of unspecified site of left female breast: Secondary | ICD-10-CM | POA: Diagnosis not present

## 2018-10-25 ENCOUNTER — Other Ambulatory Visit: Payer: Medicare Other

## 2018-10-29 DIAGNOSIS — C50912 Malignant neoplasm of unspecified site of left female breast: Secondary | ICD-10-CM | POA: Diagnosis not present

## 2018-10-29 DIAGNOSIS — Z9011 Acquired absence of right breast and nipple: Secondary | ICD-10-CM | POA: Diagnosis not present

## 2018-10-31 DIAGNOSIS — M48061 Spinal stenosis, lumbar region without neurogenic claudication: Secondary | ICD-10-CM | POA: Diagnosis not present

## 2018-10-31 DIAGNOSIS — M545 Low back pain: Secondary | ICD-10-CM | POA: Diagnosis not present

## 2018-10-31 DIAGNOSIS — M4120 Other idiopathic scoliosis, site unspecified: Secondary | ICD-10-CM | POA: Diagnosis not present

## 2018-10-31 DIAGNOSIS — M48062 Spinal stenosis, lumbar region with neurogenic claudication: Secondary | ICD-10-CM | POA: Diagnosis not present

## 2018-11-05 ENCOUNTER — Other Ambulatory Visit: Payer: Self-pay | Admitting: Neurosurgery

## 2018-11-08 DIAGNOSIS — L821 Other seborrheic keratosis: Secondary | ICD-10-CM | POA: Diagnosis not present

## 2018-11-08 DIAGNOSIS — B353 Tinea pedis: Secondary | ICD-10-CM | POA: Diagnosis not present

## 2018-11-08 DIAGNOSIS — B351 Tinea unguium: Secondary | ICD-10-CM | POA: Diagnosis not present

## 2018-11-08 DIAGNOSIS — L578 Other skin changes due to chronic exposure to nonionizing radiation: Secondary | ICD-10-CM | POA: Diagnosis not present

## 2018-11-30 ENCOUNTER — Other Ambulatory Visit: Payer: Self-pay

## 2018-11-30 DIAGNOSIS — Z20822 Contact with and (suspected) exposure to covid-19: Secondary | ICD-10-CM

## 2018-12-01 LAB — NOVEL CORONAVIRUS, NAA: SARS-CoV-2, NAA: NOT DETECTED

## 2018-12-18 DIAGNOSIS — M4120 Other idiopathic scoliosis, site unspecified: Secondary | ICD-10-CM | POA: Diagnosis not present

## 2018-12-24 ENCOUNTER — Encounter (HOSPITAL_COMMUNITY): Payer: Self-pay

## 2018-12-24 NOTE — Progress Notes (Signed)
[place], [place] [address] Phone: [phone] Fax: [place], [place] - [address] [place] [address] Phone: [phone] Fax: [phone]      Your procedure is scheduled on 12/28/2018.  Report to [facility] Main Entrance "A" at 5:30 A.M., and check in at the Admitting office.  Call this number if you have problems the morning of surgery:  [phone]  Call [phone] if you have any questions prior to your surgery date Monday-Friday 8am-4pm    Remember:  Do not eat or drink after midnight the night before your surgery    Take these medicines the morning of surgery with A SIP OF WATER: acetaminophen (TYLENOL) - as needed levothyroxine (SYNTHROID, LEVOTHROID) VASCEPA  As of today, STOP taking any Aspirin (unless otherwise instructed by your surgeon), Aleve, Naproxen, Ibuprofen, Motrin, Advil, Goody's, BC's, all herbal medications, fish oil, and all vitamins.    The Morning of Surgery  Do not wear jewelry, make-up or nail polish.  Do not wear lotions, powders, or perfumes or deodorant  Do not shave 48 hours prior to surgery.    Do not bring valuables to the hospital.  [facility] is not responsible for any belongings or valuables.  If you are a smoker, DO NOT Smoke 24 hours prior to surgery  If you wear a CPAP at night please bring your mask, tubing, and machine the morning of surgery   Remember that you must have someone to transport you home after your surgery, and remain with you for 24 hours if you are discharged the same day.   Please bring cases for contacts, glasses, hearing aids, dentures or bridgework because it cannot be worn into surgery.    Leave your suitcase in the car.  After surgery it may be brought to your room.  For patients admitted to the hospital, discharge time will be  determined by your treatment team.  Patients discharged the day of surgery will not be allowed to drive home.    Special instructions:   - Preparing For Surgery  Before surgery, you can play an important role. Because skin is not sterile, your skin needs to be as free of germs as possible. You can reduce the number of germs on your skin by washing with CHG (chlorahexidine gluconate) Soap before surgery.  CHG is an antiseptic cleaner which kills germs and bonds with the skin to continue killing germs even after washing.    Oral Hygiene is also important to reduce your risk of infection.  Remember - BRUSH YOUR TEETH THE MORNING OF SURGERY WITH YOUR REGULAR TOOTHPASTE  Please do not use if you have an allergy to CHG or antibacterial soaps. If your skin becomes reddened/irritated stop using the CHG.  Do not shave (including legs and underarms) for at least 48 hours prior to first CHG shower. It is OK to shave your face.  Please follow these instructions carefully.   1. Shower the NIGHT BEFORE SURGERY and the MORNING OF SURGERY with CHG Soap.   2. If you chose to wash your hair, wash your hair first as usual with your normal shampoo.  3. After you shampoo, rinse your hair and body thoroughly to remove the shampoo.  4. Use CHG as you would any other liquid soap. You can apply CHG directly to the skin and wash gently with  a scrungie or a clean washcloth.   5. Apply the CHG Soap to your body ONLY FROM THE NECK DOWN.  Do not use on open wounds or open sores. Avoid contact with your eyes, ears, mouth and genitals (private parts). Wash Face and genitals (private parts)  with your normal soap.   6. Wash thoroughly, paying special attention to the area where your surgery will be performed.  7. Thoroughly rinse your body with warm water from the neck down.  8. DO NOT shower/wash with your normal soap after using and rinsing off the CHG Soap.  9. Pat yourself dry with a CLEAN  TOWEL.  10. Wear CLEAN PAJAMAS to bed the night before surgery, wear comfortable clothes the morning of surgery  11. Place CLEAN SHEETS on your bed the night of your first shower and DO NOT SLEEP WITH PETS.    Day of Surgery:  Please shower the morning of surgery with the CHG soap Do not apply any deodorants/lotions. Please wear clean clothes to the hospital/surgery center.   Remember to brush your teeth WITH YOUR REGULAR TOOTHPASTE.   Please read over the following fact sheets that you were given.

## 2018-12-25 ENCOUNTER — Encounter (HOSPITAL_COMMUNITY): Payer: Self-pay

## 2018-12-25 ENCOUNTER — Encounter (HOSPITAL_COMMUNITY)
Admission: RE | Admit: 2018-12-25 | Discharge: 2018-12-25 | Disposition: A | Discharge status: Home / Self Care | Payer: Medicare Other | Source: Ambulatory Visit | Attending: Neurosurgery | Admitting: Neurosurgery

## 2018-12-25 ENCOUNTER — Other Ambulatory Visit (HOSPITAL_COMMUNITY)
Admission: RE | Admit: 2018-12-25 | Discharge: 2018-12-25 | Disposition: A | Discharge status: Home / Self Care | Payer: Medicare Other | Source: Ambulatory Visit | Attending: Neurosurgery | Admitting: Neurosurgery

## 2018-12-25 ENCOUNTER — Other Ambulatory Visit: Payer: Self-pay

## 2018-12-25 DIAGNOSIS — M4726 Other spondylosis with radiculopathy, lumbar region: Secondary | ICD-10-CM | POA: Diagnosis not present

## 2018-12-25 DIAGNOSIS — Z79899 Other long term (current) drug therapy: Secondary | ICD-10-CM | POA: Insufficient documentation

## 2018-12-25 DIAGNOSIS — M4316 Spondylolisthesis, lumbar region: Secondary | ICD-10-CM | POA: Diagnosis not present

## 2018-12-25 DIAGNOSIS — Z01812 Encounter for preprocedural laboratory examination: Secondary | ICD-10-CM | POA: Insufficient documentation

## 2018-12-25 DIAGNOSIS — Z9013 Acquired absence of bilateral breasts and nipples: Secondary | ICD-10-CM | POA: Diagnosis not present

## 2018-12-25 DIAGNOSIS — E781 Pure hyperglyceridemia: Secondary | ICD-10-CM | POA: Diagnosis not present

## 2018-12-25 DIAGNOSIS — Z20828 Contact with and (suspected) exposure to other viral communicable diseases: Secondary | ICD-10-CM | POA: Diagnosis not present

## 2018-12-25 DIAGNOSIS — M4126 Other idiopathic scoliosis, lumbar region: Secondary | ICD-10-CM | POA: Diagnosis not present

## 2018-12-25 DIAGNOSIS — Z79890 Hormone replacement therapy (postmenopausal): Secondary | ICD-10-CM | POA: Diagnosis not present

## 2018-12-25 DIAGNOSIS — E039 Hypothyroidism, unspecified: Secondary | ICD-10-CM | POA: Diagnosis not present

## 2018-12-25 DIAGNOSIS — M4326 Fusion of spine, lumbar region: Secondary | ICD-10-CM | POA: Diagnosis not present

## 2018-12-25 DIAGNOSIS — Z981 Arthrodesis status: Secondary | ICD-10-CM | POA: Diagnosis not present

## 2018-12-25 DIAGNOSIS — N189 Chronic kidney disease, unspecified: Secondary | ICD-10-CM | POA: Insufficient documentation

## 2018-12-25 DIAGNOSIS — M5416 Radiculopathy, lumbar region: Secondary | ICD-10-CM | POA: Diagnosis not present

## 2018-12-25 DIAGNOSIS — M48062 Spinal stenosis, lumbar region with neurogenic claudication: Secondary | ICD-10-CM | POA: Diagnosis not present

## 2018-12-25 DIAGNOSIS — Z923 Personal history of irradiation: Secondary | ICD-10-CM | POA: Diagnosis not present

## 2018-12-25 DIAGNOSIS — I2510 Atherosclerotic heart disease of native coronary artery without angina pectoris: Secondary | ICD-10-CM | POA: Diagnosis not present

## 2018-12-25 DIAGNOSIS — Z9049 Acquired absence of other specified parts of digestive tract: Secondary | ICD-10-CM | POA: Diagnosis not present

## 2018-12-25 DIAGNOSIS — J45909 Unspecified asthma, uncomplicated: Secondary | ICD-10-CM | POA: Insufficient documentation

## 2018-12-25 DIAGNOSIS — Z853 Personal history of malignant neoplasm of breast: Secondary | ICD-10-CM | POA: Diagnosis not present

## 2018-12-25 DIAGNOSIS — Z9221 Personal history of antineoplastic chemotherapy: Secondary | ICD-10-CM | POA: Diagnosis not present

## 2018-12-25 HISTORY — DX: Other seasonal allergic rhinitis: J30.2

## 2018-12-25 HISTORY — DX: Chronic kidney disease, unspecified: N18.9

## 2018-12-25 HISTORY — DX: Personal history of urinary calculi: Z87.442

## 2018-12-25 HISTORY — DX: Orthostatic hypotension: I95.1

## 2018-12-25 LAB — BASIC METABOLIC PANEL
Anion gap: 16 — ABNORMAL HIGH (ref 5–15)
BUN: 13 mg/dL (ref 8–23)
CO2: 20 mmol/L — ABNORMAL LOW (ref 22–32)
Calcium: 10.3 mg/dL (ref 8.9–10.3)
Chloride: 104 mmol/L (ref 98–111)
Creatinine, Ser: 0.79 mg/dL (ref 0.44–1.00)
GFR calc Af Amer: >60 mL/min (ref >60)
GFR calc non Af Amer: >60 mL/min (ref >60)
Glucose, Bld: 84 mg/dL (ref 70–99)
Potassium: 4.1 mmol/L (ref 3.5–5.1)
Sodium: 140 mmol/L (ref 135–145)

## 2018-12-25 LAB — CBC
HCT: 43.7 % (ref 36.0–46.0)
Hemoglobin: 14.3 g/dL (ref 12.0–15.0)
MCH: 29.7 pg (ref 26.0–34.0)
MCHC: 32.7 g/dL (ref 30.0–36.0)
MCV: 90.7 fL (ref 80.0–100.0)
Platelets: 245 10*3/uL (ref 150–400)
RBC: 4.82 MIL/uL (ref 3.87–5.11)
RDW: 12.1 % (ref 11.5–15.5)
WBC: 5.5 10*3/uL (ref 4.0–10.5)
nRBC: 0 % (ref 0.0–0.2)

## 2018-12-25 LAB — SURGICAL PCR SCREEN
MRSA, PCR: NEGATIVE
Staphylococcus aureus: POSITIVE — AB

## 2018-12-25 LAB — ABO/RH: ABO/RH(D): A POS

## 2018-12-25 LAB — TYPE AND SCREEN
ABO/RH(D): A POS
Antibody Screen: NEGATIVE

## 2018-12-25 NOTE — Progress Notes (Signed)
PCP - Reynold Bowen, MD Cardiologist - Jettie Booze, MD  PPM/ICD - Denies  Chest x-ray - 03/06/2018 EKG - 06/13/2018 Stress Test - 06/13/2018 ECHO - 02/03/2003 Cardiac Cath - 05/06/2003  Sleep Study - Denies  Patient denies being a diabetic.  Blood Thinner Instructions: N/A Aspirin Instructions: N/A  ERAS Protcol - None PRE-SURGERY Ensure or G2- None  COVID TEST- 12/25/2018   Anesthesia review:  Yes, Postop N/V.  Patient denies shortness of breath, fever, cough and chest pain at PAT appointment  Coronavirus Screening  Have you experienced the following symptoms:  Cough yes/no: No Fever (>100.54F)  yes/no: No Runny nose yes/no: No Sore throat yes/no: No Difficulty breathing/shortness of breath  yes/no: No  Have you or a family member traveled in the last 14 days and where? yes/no: No   If the patient indicates "YES" to the above questions, their PAT will be rescheduled to limit the exposure to others and, the surgeon will be notified. THE PATIENT WILL NEED TO BE ASYMPTOMATIC FOR 14 DAYS.   If the patient is not experiencing any of these symptoms, the PAT nurse will instruct them to NOT bring anyone with them to their appointment since they may have these symptoms or traveled as well.   Please remind your patients and families that hospital visitation restrictions are in effect and the importance of the restrictions.    All instructions explained to the patient, with a verbal understanding of the material. Patient agrees to go over the instructions while at home for a better understanding. Patient also instructed to self quarantine after being tested for COVID-19. The opportunity to ask questions was provided.
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
## 2018-12-27 LAB — NOVEL CORONAVIRUS, NAA (HOSP ORDER, SEND-OUT TO REF LAB; TAT 18-24 HRS): SARS-CoV-2, NAA: NOT DETECTED
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
## 2018-12-28 ENCOUNTER — Inpatient Hospital Stay (HOSPITAL_COMMUNITY): Payer: Medicare Other

## 2018-12-28 ENCOUNTER — Inpatient Hospital Stay (HOSPITAL_COMMUNITY): Payer: Medicare Other | Admitting: Vascular Surgery

## 2018-12-28 ENCOUNTER — Inpatient Hospital Stay (HOSPITAL_COMMUNITY): Payer: Medicare Other | Admitting: Anesthesiology

## 2018-12-28 ENCOUNTER — Inpatient Hospital Stay (HOSPITAL_COMMUNITY)
Admission: RE | Admit: 2018-12-28 | Discharge: 2018-12-29 | DRG: 455 | Disposition: A | Discharge status: Home / Self Care | Payer: Medicare Other | Attending: Neurosurgery | Admitting: Neurosurgery

## 2018-12-28 ENCOUNTER — Encounter (HOSPITAL_COMMUNITY): Payer: Self-pay

## 2018-12-28 ENCOUNTER — Other Ambulatory Visit: Payer: Self-pay

## 2018-12-28 ENCOUNTER — Encounter (HOSPITAL_COMMUNITY)
Admission: RE | Disposition: A | Discharge status: Home / Self Care | Payer: Self-pay | Source: Home / Self Care | Attending: Neurosurgery

## 2018-12-28 DIAGNOSIS — Z79890 Hormone replacement therapy (postmenopausal): Secondary | ICD-10-CM

## 2018-12-28 DIAGNOSIS — Z923 Personal history of irradiation: Secondary | ICD-10-CM | POA: Diagnosis not present

## 2018-12-28 DIAGNOSIS — E039 Hypothyroidism, unspecified: Secondary | ICD-10-CM | POA: Diagnosis present

## 2018-12-28 DIAGNOSIS — I2510 Atherosclerotic heart disease of native coronary artery without angina pectoris: Secondary | ICD-10-CM | POA: Diagnosis present

## 2018-12-28 DIAGNOSIS — M4126 Other idiopathic scoliosis, lumbar region: Secondary | ICD-10-CM | POA: Diagnosis present

## 2018-12-28 DIAGNOSIS — M5416 Radiculopathy, lumbar region: Secondary | ICD-10-CM | POA: Diagnosis present

## 2018-12-28 DIAGNOSIS — Z20828 Contact with and (suspected) exposure to other viral communicable diseases: Secondary | ICD-10-CM | POA: Diagnosis present

## 2018-12-28 DIAGNOSIS — M4316 Spondylolisthesis, lumbar region: Secondary | ICD-10-CM | POA: Diagnosis present

## 2018-12-28 DIAGNOSIS — Z9049 Acquired absence of other specified parts of digestive tract: Secondary | ICD-10-CM

## 2018-12-28 DIAGNOSIS — Z9013 Acquired absence of bilateral breasts and nipples: Secondary | ICD-10-CM | POA: Diagnosis not present

## 2018-12-28 DIAGNOSIS — Z981 Arthrodesis status: Secondary | ICD-10-CM | POA: Diagnosis not present

## 2018-12-28 DIAGNOSIS — M48062 Spinal stenosis, lumbar region with neurogenic claudication: Principal | ICD-10-CM | POA: Diagnosis present

## 2018-12-28 DIAGNOSIS — Z853 Personal history of malignant neoplasm of breast: Secondary | ICD-10-CM | POA: Diagnosis not present

## 2018-12-28 DIAGNOSIS — M4326 Fusion of spine, lumbar region: Secondary | ICD-10-CM | POA: Diagnosis not present

## 2018-12-28 DIAGNOSIS — E781 Pure hyperglyceridemia: Secondary | ICD-10-CM | POA: Diagnosis present

## 2018-12-28 DIAGNOSIS — Z419 Encounter for procedure for purposes other than remedying health state, unspecified: Secondary | ICD-10-CM

## 2018-12-28 DIAGNOSIS — Z9221 Personal history of antineoplastic chemotherapy: Secondary | ICD-10-CM | POA: Diagnosis not present

## 2018-12-28 HISTORY — PX: ANTERIOR LAT LUMBAR FUSION: SHX1168

## 2018-12-28 HISTORY — PX: LUMBAR PERCUTANEOUS PEDICLE SCREW 2 LEVEL: SHX5561

## 2018-12-28 SURGERY — ANTERIOR LATERAL LUMBAR FUSION 2 LEVELS
Anesthesia: General | Site: Spine Lumbar

## 2018-12-28 MED ORDER — EPHEDRINE SULFATE 50 MG/ML IJ SOLN
INTRAMUSCULAR | Status: DC | PRN
  Administered 2018-12-28: 5 mg via INTRAVENOUS
  Administered 2018-12-28: 10 mg via INTRAVENOUS

## 2018-12-28 MED ORDER — OXYCODONE HCL 5 MG/5ML PO SOLN: 5.0000 mg | Freq: Once | ORAL | Status: DC | PRN

## 2018-12-28 MED ORDER — LACTATED RINGERS IV SOLN
INTRAVENOUS | Status: DC | PRN
  Administered 2018-12-28: 08:00:00 via INTRAVENOUS

## 2018-12-28 MED ORDER — PANTOPRAZOLE SODIUM 40 MG PO TBEC
40.0000 mg | DELAYED_RELEASE_TABLET | Freq: Every day | ORAL | Status: DC
  Administered 2018-12-28: 21:00:00 40 mg via ORAL
  Filled 2018-12-28: qty 1

## 2018-12-28 MED ORDER — FENTANYL CITRATE (PF) 100 MCG/2ML IJ SOLN
25.0000 ug | INTRAMUSCULAR | Status: DC | PRN
  Administered 2018-12-28: 50 ug via INTRAVENOUS

## 2018-12-28 MED ORDER — FENTANYL CITRATE (PF) 250 MCG/5ML IJ SOLN
INTRAMUSCULAR | Status: AC
  Filled 2018-12-28: qty 5

## 2018-12-28 MED ORDER — ACETAMINOPHEN 325 MG PO TABS
650.0000 mg | ORAL_TABLET | ORAL | Status: DC | PRN
  Administered 2018-12-28 – 2018-12-29 (×2): 650 mg via ORAL
  Filled 2018-12-28 (×2): qty 2

## 2018-12-28 MED ORDER — CEFAZOLIN SODIUM-DEXTROSE 2-4 GM/100ML-% IV SOLN
2.0000 g | INTRAVENOUS | Status: AC
  Administered 2018-12-28: 2 g via INTRAVENOUS
  Filled 2018-12-28: qty 100

## 2018-12-28 MED ORDER — FENTANYL CITRATE (PF) 100 MCG/2ML IJ SOLN
INTRAMUSCULAR | Status: AC
  Filled 2018-12-28: qty 2

## 2018-12-28 MED ORDER — PHENOL 1.4 % MT LIQD: 1.0000 | OROMUCOSAL | Status: DC | PRN

## 2018-12-28 MED ORDER — PROPOFOL 10 MG/ML IV BOLUS
INTRAVENOUS | Status: AC
  Filled 2018-12-28: qty 40

## 2018-12-28 MED ORDER — FENTANYL CITRATE (PF) 250 MCG/5ML IJ SOLN
INTRAMUSCULAR | Status: DC | PRN
  Administered 2018-12-28 (×3): 50 ug via INTRAVENOUS

## 2018-12-28 MED ORDER — LIDOCAINE 2% (20 MG/ML) 5 ML SYRINGE
INTRAMUSCULAR | Status: DC | PRN
  Administered 2018-12-28: 40 mg via INTRAVENOUS

## 2018-12-28 MED ORDER — HYDROXYZINE HCL 50 MG/ML IM SOLN
50.0000 mg | Freq: Four times a day (QID) | INTRAMUSCULAR | Status: DC | PRN
  Administered 2018-12-28: 50 mg via INTRAMUSCULAR
  Filled 2018-12-28: qty 1

## 2018-12-28 MED ORDER — VITAMIN D (ERGOCALCIFEROL) 1.25 MG (50000 UNIT) PO CAPS: 50000.0000 [IU] | ORAL_CAPSULE | ORAL | Status: DC

## 2018-12-28 MED ORDER — THROMBIN 5000 UNITS EX SOLR
CUTANEOUS | Status: AC
  Filled 2018-12-28: qty 5000

## 2018-12-28 MED ORDER — LEVOTHYROXINE SODIUM 75 MCG PO TABS
75.0000 ug | ORAL_TABLET | Freq: Every day | ORAL | Status: DC
  Administered 2018-12-29: 75 ug via ORAL
  Filled 2018-12-28: qty 1

## 2018-12-28 MED ORDER — OXYCODONE HCL 5 MG PO TABS: 5.0000 mg | ORAL_TABLET | Freq: Once | ORAL | Status: DC | PRN

## 2018-12-28 MED ORDER — GLYCOPYRROLATE 0.2 MG/ML IJ SOLN
INTRAMUSCULAR | Status: DC | PRN
  Administered 2018-12-28: 0.2 mg via INTRAVENOUS

## 2018-12-28 MED ORDER — MENTHOL 3 MG MT LOZG: 1.0000 | LOZENGE | OROMUCOSAL | Status: DC | PRN

## 2018-12-28 MED ORDER — CHLORHEXIDINE GLUCONATE CLOTH 2 % EX PADS: 6.0000 | MEDICATED_PAD | Freq: Once | CUTANEOUS | Status: DC

## 2018-12-28 MED ORDER — PHENYLEPHRINE HCL-NACL 10-0.9 MG/250ML-% IV SOLN
INTRAVENOUS | Status: DC | PRN
  Administered 2018-12-28: 40 ug/min via INTRAVENOUS

## 2018-12-28 MED ORDER — DEXAMETHASONE SODIUM PHOSPHATE 10 MG/ML IJ SOLN
INTRAMUSCULAR | Status: DC | PRN
  Administered 2018-12-28: 5 mg via INTRAVENOUS

## 2018-12-28 MED ORDER — ZOLPIDEM TARTRATE 5 MG PO TABS: 5.0000 mg | ORAL_TABLET | Freq: Every evening | ORAL | Status: DC | PRN

## 2018-12-28 MED ORDER — GABAPENTIN 100 MG PO CAPS
100.0000 mg | ORAL_CAPSULE | Freq: Three times a day (TID) | ORAL | Status: DC
  Administered 2018-12-28 – 2018-12-29 (×3): 100 mg via ORAL
  Filled 2018-12-28 (×3): qty 1

## 2018-12-28 MED ORDER — POLYVINYL ALCOHOL 1.4 % OP SOLN
1.0000 [drp] | Freq: Two times a day (BID) | OPHTHALMIC | Status: DC | PRN
  Filled 2018-12-28: qty 15

## 2018-12-28 MED ORDER — PROPOFOL 10 MG/ML IV BOLUS
INTRAVENOUS | Status: DC | PRN
  Administered 2018-12-28: 110 mg via INTRAVENOUS

## 2018-12-28 MED ORDER — CEFAZOLIN SODIUM-DEXTROSE 2-4 GM/100ML-% IV SOLN
2.0000 g | Freq: Three times a day (TID) | INTRAVENOUS | Status: AC
  Administered 2018-12-28 (×2): 2 g via INTRAVENOUS
  Filled 2018-12-28 (×2): qty 100

## 2018-12-28 MED ORDER — ACETAMINOPHEN 650 MG RE SUPP: 650.0000 mg | RECTAL | Status: DC | PRN

## 2018-12-28 MED ORDER — DOCUSATE SODIUM 100 MG PO CAPS
100.0000 mg | ORAL_CAPSULE | Freq: Two times a day (BID) | ORAL | Status: DC
  Administered 2018-12-28 – 2018-12-29 (×3): 100 mg via ORAL
  Filled 2018-12-28 (×3): qty 1

## 2018-12-28 MED ORDER — BUPIVACAINE LIPOSOME 1.3 % IJ SUSP
INTRAMUSCULAR | Status: DC | PRN
  Administered 2018-12-28: 20 mL

## 2018-12-28 MED ORDER — THROMBIN 5000 UNITS EX SOLR
OROMUCOSAL | Status: DC | PRN
  Administered 2018-12-28: 09:00:00 via TOPICAL

## 2018-12-28 MED ORDER — POLYETHYLENE GLYCOL 3350 17 G PO PACK
17.0000 g | PACK | Freq: Every day | ORAL | Status: DC
  Administered 2018-12-28 – 2018-12-29 (×2): 17 g via ORAL
  Filled 2018-12-28 (×2): qty 1

## 2018-12-28 MED ORDER — ALUM & MAG HYDROXIDE-SIMETH 200-200-20 MG/5ML PO SUSP
30.0000 mL | Freq: Four times a day (QID) | ORAL | Status: DC | PRN
  Administered 2018-12-29: 30 mL via ORAL
  Filled 2018-12-28: qty 30

## 2018-12-28 MED ORDER — LIDOCAINE-EPINEPHRINE 1 %-1:100000 IJ SOLN
INTRAMUSCULAR | Status: DC | PRN
  Administered 2018-12-28: 8.5 mL
  Administered 2018-12-28: 7.5 mL

## 2018-12-28 MED ORDER — BISACODYL 10 MG RE SUPP: 10.0000 mg | Freq: Every day | RECTAL | Status: DC | PRN

## 2018-12-28 MED ORDER — EVOLOCUMAB 140 MG/ML ~~LOC~~ SOAJ: 140.0000 mg | SUBCUTANEOUS | Status: DC

## 2018-12-28 MED ORDER — HYDROMORPHONE HCL 1 MG/ML IJ SOLN
0.5000 mg | INTRAMUSCULAR | Status: DC | PRN
  Administered 2018-12-28: 0.5 mg via INTRAVENOUS
  Filled 2018-12-28: qty 0.5

## 2018-12-28 MED ORDER — 0.9 % SODIUM CHLORIDE (POUR BTL) OPTIME
TOPICAL | Status: DC | PRN
  Administered 2018-12-28: 1000 mL

## 2018-12-28 MED ORDER — FLEET ENEMA 7-19 GM/118ML RE ENEM: 1.0000 | ENEMA | Freq: Once | RECTAL | Status: DC | PRN

## 2018-12-28 MED ORDER — POLYETHYL GLYCOL-PROPYL GLYCOL 0.4-0.3 % OP SOLN: 1.0000 [drp] | Freq: Two times a day (BID) | OPHTHALMIC | Status: DC | PRN

## 2018-12-28 MED ORDER — BUPIVACAINE HCL (PF) 0.5 % IJ SOLN
INTRAMUSCULAR | Status: DC | PRN
  Administered 2018-12-28: 8.5 mL
  Administered 2018-12-28: 7.5 mL

## 2018-12-28 MED ORDER — ONDANSETRON HCL 4 MG PO TABS: 4.0000 mg | ORAL_TABLET | Freq: Four times a day (QID) | ORAL | Status: DC | PRN

## 2018-12-28 MED ORDER — ACETAMINOPHEN 500 MG PO TABS: 1000.0000 mg | ORAL_TABLET | Freq: Four times a day (QID) | ORAL | Status: DC | PRN

## 2018-12-28 MED ORDER — SODIUM CHLORIDE 0.9% FLUSH: 3.0000 mL | Freq: Two times a day (BID) | INTRAVENOUS | Status: DC

## 2018-12-28 MED ORDER — PROPOFOL 500 MG/50ML IV EMUL
INTRAVENOUS | Status: DC | PRN
  Administered 2018-12-28: 50 ug/kg/min via INTRAVENOUS

## 2018-12-28 MED ORDER — MIDAZOLAM HCL 2 MG/2ML IJ SOLN
INTRAMUSCULAR | Status: AC
  Filled 2018-12-28: qty 2

## 2018-12-28 MED ORDER — METHOCARBAMOL 1000 MG/10ML IJ SOLN
500.0000 mg | Freq: Four times a day (QID) | INTRAVENOUS | Status: DC | PRN
  Filled 2018-12-28: qty 5

## 2018-12-28 MED ORDER — BUPIVACAINE HCL (PF) 0.5 % IJ SOLN
INTRAMUSCULAR | Status: AC
  Filled 2018-12-28: qty 30

## 2018-12-28 MED ORDER — POLYETHYLENE GLYCOL 3350 17 G PO PACK: 17.0000 g | PACK | Freq: Every day | ORAL | Status: DC | PRN

## 2018-12-28 MED ORDER — ROCURONIUM BROMIDE 50 MG/5ML IV SOSY
PREFILLED_SYRINGE | INTRAVENOUS | Status: DC | PRN
  Administered 2018-12-28: 20 mg via INTRAVENOUS
  Administered 2018-12-28: 30 mg via INTRAVENOUS
  Administered 2018-12-28: 10 mg via INTRAVENOUS

## 2018-12-28 MED ORDER — SODIUM CHLORIDE 0.9% FLUSH: 3.0000 mL | INTRAVENOUS | Status: DC | PRN

## 2018-12-28 MED ORDER — LACTATED RINGERS IV SOLN
INTRAVENOUS | Status: DC | PRN
  Administered 2018-12-28: 07:00:00 via INTRAVENOUS

## 2018-12-28 MED ORDER — SUCCINYLCHOLINE CHLORIDE 200 MG/10ML IV SOSY
PREFILLED_SYRINGE | INTRAVENOUS | Status: DC | PRN
  Administered 2018-12-28: 60 mg via INTRAVENOUS

## 2018-12-28 MED ORDER — ONDANSETRON HCL 4 MG/2ML IJ SOLN: 4.0000 mg | Freq: Four times a day (QID) | INTRAMUSCULAR | Status: DC | PRN

## 2018-12-28 MED ORDER — LIDOCAINE-EPINEPHRINE 1 %-1:100000 IJ SOLN
INTRAMUSCULAR | Status: AC
  Filled 2018-12-28: qty 1

## 2018-12-28 MED ORDER — KCL IN DEXTROSE-NACL 20-5-0.45 MEQ/L-%-% IV SOLN: INTRAVENOUS | Status: DC

## 2018-12-28 MED ORDER — CICLOPIROX 8 % EX SOLN: 1.0000 "application " | Freq: Every day | CUTANEOUS | Status: DC

## 2018-12-28 MED ORDER — HYDROMORPHONE HCL 2 MG PO TABS
2.0000 mg | ORAL_TABLET | ORAL | Status: DC | PRN
  Administered 2018-12-28 – 2018-12-29 (×3): 2 mg via ORAL
  Filled 2018-12-28 (×3): qty 1

## 2018-12-28 MED ORDER — ICOSAPENT ETHYL 1 G PO CAPS
2.0000 g | ORAL_CAPSULE | Freq: Two times a day (BID) | ORAL | Status: DC
  Administered 2018-12-29: 2 g via ORAL
  Filled 2018-12-28 (×3): qty 2

## 2018-12-28 MED ORDER — ONDANSETRON HCL 4 MG/2ML IJ SOLN
INTRAMUSCULAR | Status: DC | PRN
  Administered 2018-12-28: 4 mg via INTRAVENOUS

## 2018-12-28 MED ORDER — METHOCARBAMOL 500 MG PO TABS
500.0000 mg | ORAL_TABLET | Freq: Four times a day (QID) | ORAL | Status: DC | PRN
  Administered 2018-12-28 – 2018-12-29 (×2): 500 mg via ORAL
  Filled 2018-12-28 (×2): qty 1

## 2018-12-28 MED ORDER — SODIUM CHLORIDE 0.9 % IV SOLN: 250.0000 mL | INTRAVENOUS | Status: DC

## 2018-12-28 MED ORDER — PANTOPRAZOLE SODIUM 40 MG IV SOLR: 40.0000 mg | Freq: Every day | INTRAVENOUS | Status: DC

## 2018-12-28 MED ORDER — ONDANSETRON HCL 4 MG/2ML IJ SOLN: 4.0000 mg | Freq: Once | INTRAMUSCULAR | Status: DC | PRN

## 2018-12-28 MED ORDER — SUGAMMADEX SODIUM 200 MG/2ML IV SOLN
INTRAVENOUS | Status: DC | PRN
  Administered 2018-12-28: 109 mg via INTRAVENOUS

## 2018-12-28 SURGICAL SUPPLY — 68 items
ADH SKN CLS APL DERMABOND .7 (GAUZE/BANDAGES/DRESSINGS) ×2
BONE CANC CHIPS 40CC CAN1/2 (Bone Implant) ×2 IMPLANT
BUR MATCHSTICK NEURO 3.0 LAGG (BURR) ×1 IMPLANT
BUR PRECISION FLUTE 5.0 (BURR) ×1 IMPLANT
CAGE MODULUS XL 10X18X50 - 10 (Cage) ×1 IMPLANT
CAGE MODULUS XL 8X18X45 - 10 (Cage) ×1 IMPLANT
CARTRIDGE OIL MAESTRO DRILL (MISCELLANEOUS) ×1 IMPLANT
CHIPS CANC BONE 40CC CAN1/2 (Bone Implant) ×1 IMPLANT
CONT SPEC 4OZ CLIKSEAL STRL BL (MISCELLANEOUS) ×2 IMPLANT
COVER BACK TABLE 60X90IN (DRAPES) ×2 IMPLANT
DERMABOND ADVANCED (GAUZE/BANDAGES/DRESSINGS) ×2
DERMABOND ADVANCED .7 DNX12 (GAUZE/BANDAGES/DRESSINGS) ×2 IMPLANT
DIFFUSER DRILL AIR PNEUMATIC (MISCELLANEOUS) ×2 IMPLANT
DRAPE C-ARM 42X72 X-RAY (DRAPES) ×4 IMPLANT
DRAPE C-ARMOR (DRAPES) ×4 IMPLANT
DRAPE LAPAROTOMY 100X72X124 (DRAPES) ×4 IMPLANT
DRAPE SURG 17X23 STRL (DRAPES) ×2 IMPLANT
DRSG OPSITE POSTOP 3X4 (GAUZE/BANDAGES/DRESSINGS) ×1 IMPLANT
DRSG OPSITE POSTOP 4X6 (GAUZE/BANDAGES/DRESSINGS) ×2 IMPLANT
DURAPREP 26ML APPLICATOR (WOUND CARE) ×4 IMPLANT
ELECT REM PT RETURN 9FT ADLT (ELECTROSURGICAL) ×4
ELECTRODE REM PT RTRN 9FT ADLT (ELECTROSURGICAL) ×2 IMPLANT
GLOVE BIO SURGEON STRL SZ8 (GLOVE) ×9 IMPLANT
GLOVE BIO SURGEON STRL SZ8.5 (GLOVE) ×2 IMPLANT
GLOVE BIOGEL PI IND STRL 8 (GLOVE) ×2 IMPLANT
GLOVE BIOGEL PI IND STRL 8.5 (GLOVE) ×3 IMPLANT
GLOVE BIOGEL PI INDICATOR 8 (GLOVE) ×7
GLOVE BIOGEL PI INDICATOR 8.5 (GLOVE) ×4
GLOVE ECLIPSE 7.5 STRL STRAW (GLOVE) ×3 IMPLANT
GLOVE ECLIPSE 8.0 STRL XLNG CF (GLOVE) ×6 IMPLANT
GOWN STRL REUS W/ TWL LRG LVL3 (GOWN DISPOSABLE) IMPLANT
GOWN STRL REUS W/ TWL XL LVL3 (GOWN DISPOSABLE) ×3 IMPLANT
GOWN STRL REUS W/TWL 2XL LVL3 (GOWN DISPOSABLE) ×5 IMPLANT
GOWN STRL REUS W/TWL LRG LVL3 (GOWN DISPOSABLE)
GOWN STRL REUS W/TWL XL LVL3 (GOWN DISPOSABLE) ×8
GRAFT BNE CHIP CANC 1-8 40 (Bone Implant) IMPLANT
KIT BASIN OR (CUSTOM PROCEDURE TRAY) ×4 IMPLANT
KIT DILATOR XLIF 5 (KITS) ×1 IMPLANT
KIT INFUSE X SMALL 1.4CC (Orthopedic Implant) ×1 IMPLANT
KIT POSITION SURG JACKSON T1 (MISCELLANEOUS) ×2 IMPLANT
KIT SURGICAL ACCESS MAXCESS 4 (KITS) ×1 IMPLANT
KIT TURNOVER KIT B (KITS) ×3 IMPLANT
MARKER SKIN DUAL TIP RULER LAB (MISCELLANEOUS) ×2 IMPLANT
MODULE NVM5 NEXT GEN EMG (NEEDLE) ×1 IMPLANT
NDL HYPO 25X1 1.5 SAFETY (NEEDLE) ×2 IMPLANT
NEEDLE HYPO 25X1 1.5 SAFETY (NEEDLE) ×4 IMPLANT
NS IRRIG 1000ML POUR BTL (IV SOLUTION) ×4 IMPLANT
OIL CARTRIDGE MAESTRO DRILL (MISCELLANEOUS) ×2
PACK LAMINECTOMY NEURO (CUSTOM PROCEDURE TRAY) ×4 IMPLANT
PATTIES SURGICAL .5 X.5 (GAUZE/BANDAGES/DRESSINGS) IMPLANT
PATTIES SURGICAL .5 X1 (DISPOSABLE) IMPLANT
PATTIES SURGICAL 1X1 (DISPOSABLE) IMPLANT
PUTTY BONE ATTRAX 10CC STRIP (Putty) ×1 IMPLANT
ROD RELINE-O 5.5X100 LORD (Rod) ×2 IMPLANT
SCREW LOCK RELINE 5.5 TULIP (Screw) ×8 IMPLANT
SCREW RELINE-O POLY 5.5X40 (Screw) ×1 IMPLANT
SCREW RELINE-O POLY 5.5X45MM (Screw) ×1 IMPLANT
SCREW RELINE-O POLY 6.5X40 (Screw) ×2 IMPLANT
SPONGE LAP 4X18 RFD (DISPOSABLE) IMPLANT
STAPLER SKIN PROX WIDE 3.9 (STAPLE) ×2 IMPLANT
SUT VIC AB 1 CT1 18XBRD ANBCTR (SUTURE) ×3 IMPLANT
SUT VIC AB 1 CT1 8-18 (SUTURE) ×6
SUT VIC AB 2-0 CT1 18 (SUTURE) ×6 IMPLANT
SUT VIC AB 3-0 SH 8-18 (SUTURE) ×6 IMPLANT
TOWEL GREEN STERILE (TOWEL DISPOSABLE) ×2 IMPLANT
TOWEL GREEN STERILE FF (TOWEL DISPOSABLE) ×2 IMPLANT
TRAY FOLEY MTR SLVR 16FR STAT (SET/KITS/TRAYS/PACK) ×3 IMPLANT
WATER STERILE IRR 1000ML POUR (IV SOLUTION) ×3 IMPLANT

## 2018-12-28 NOTE — Anesthesia Postprocedure Evaluation (Signed)
Anesthesia Post Note  Patient: [name]  Procedure(s) Performed: Lumbar Two-Three, Lumbar Four-Five Anterolateral lumbar interbody fusion with exploration/revision of adjacent level fusion (N/A Spine Lumbar) PLACEMENT OF PEDICLE SCREWS LUMBAR TWO-THREE, LUMBAR FOUR-FIVE (N/A Spine Lumbar)     Patient location during evaluation: PACU Anesthesia Type: General Level of consciousness: awake and alert Pain management: pain level controlled Vital Signs Assessment: post-procedure vital signs reviewed and stable Respiratory status: spontaneous breathing, nonlabored ventilation and respiratory function stable Cardiovascular status: blood pressure returned to baseline and stable Postop Assessment: no apparent nausea or vomiting Anesthetic complications: no    Last Vitals:  Vitals:   12/28/18 1300 12/28/18 1323  BP: 117/67 119/62  Pulse: 65 (!) 46  Resp: 14 17  Temp:  36.5 C  SpO2: 92%     Last Pain:  Vitals:   12/28/18 1420  PainSc: 10-Worst pain ever                 Lidia Collum

## 2018-12-28 NOTE — Interval H&P Note (Signed)
History and Physical Interval Note:  12/28/2018 7:41 AM  Crystal Lloyd  has presented today for surgery, with the diagnosis of Spinal stenosis, Lumbar region with neurogenic claudication.  The various methods of treatment have been discussed with the patient and family. After consideration of risks, benefits and other options for treatment, the patient has consented to  Procedure(s) with comments: Lumbar 2-3, Lumbar 4-5 Anterolateral lumbar interbody fusion with posterior fixation and exploration/revision of adjacent level fusion (N/A) - Lumbar 2-3, Lumbar 4-5 Anterolateral lumbar interbody fusion with posterior fixation and exploration/revision of adjacent level fusion LUMBAR PERCUTANEOUS PEDICLE SCREW 2 LEVEL (N/A) as a surgical intervention.  The patient's history has been reviewed, patient examined, no change in status, stable for surgery.  I have reviewed the patient's chart and labs.  Questions were answered to the patient's satisfaction.     Peggyann Shoals

## 2018-12-28 NOTE — Op Note (Signed)
12/28/2018  11:48 AM  PATIENT:  Crystal Lloyd  73 y.o. female  PRE-OPERATIVE DIAGNOSIS:  Spinal stenosis, Lumbar region with neurogenic claudication, lumbar spondylolisthesis, lumbago, radiculopathy  POST-OPERATIVE DIAGNOSIS:   Spinal stenosis, Lumbar region with neurogenic claudication, lumbar spondylolisthesis, lumbago, radiculopathy   PROCEDURE:  Procedure(s) with comments: Lumbar 2-3, Lumbar 4-5 Anterolateral lumbar interbody fusion with posterior fixation and exploration/revision of adjacent level fusion (N/A) - Lumbar 2-3, Lumbar 4-5 Anterolateral lumbar interbody fusion with posterior fixation and exploration/revision of adjacent level fusion with posterolateral arthrodesis LUMBAR PEDICLE SCREW 2 LEVEL (N/A)  SURGEON:  Surgeon(s) and Role:    Erline Levine, MD - Primary    * Newman Pies, MD - Assisting  PHYSICIAN ASSISTANT:   ASSISTANTS: Poteat, RN   ANESTHESIA:   general  EBL:  25 mL   BLOOD ADMINISTERED:none  DRAINS: none   LOCAL MEDICATIONS USED:  MARCAINE    and LIDOCAINE   SPECIMEN:  No Specimen  DISPOSITION OF SPECIMEN:  N/A  COUNTS:  YES  TOURNIQUET:  * No tourniquets in log *  DICTATION: Patient is a 73 year old with severe spondylosis stenosis and spondylolisthesis of the lumbar spine. It was elected to take her to surgery for anterolateral decompression and posterior pedicle screw fixation.  The patient has a prior-fusion at L 34.  Procedure: Patient was brought to the operating room and placed in a lateral decubitus position on the operative table and using orthogonally projected C-arm fluoroscopy the patient was placed so that the L2-3 and L 45  levels were visualized in AP and lateral plane. The patient was then taped into position. The table was flexed so as to expose the L 45 level as the patient has a high iliac crest. Skin was marked along with a posterior finger dissection incision. Her flank was then prepped and draped in usual sterile  fashion and incisions were made sequentially at L 45  and L2-3 levels. Posterior finger dissection was made to enter the retroperitoneal space and then subsequently the probe was inserted into the psoas muscle from the right side initially at the L 45 level. After mapping the neural elements were able to dock the probe per the midpoint of this vertebral level and without indications electrically of too close proximity to the neural tissues. Subsequently the self-retaining tractor was.after sequential dilators were utilized the shim was employed and the interspace was cleared of psoas muscle and then incised. A thorough discectomy was performed. Instruments were used to clear the interspace of disc material.An anterior entry with posterior trajectory was performed to avoid neural elements.   After thorough discectomy was performed and this was performed using AP and lateral fluoroscopy a 10 lordotic by 50 x 18 mm implant was packed with extra small BMP and Attrax. This was tamped into position and its position was confirmed on AP and lateral fluoroscopy. Subsequently exposure was performed at the L 23 level and similar dissection was performed with locking of the self-retaining retractor. At this level were able to place an 8 lordotic by 18 x  45 mm implant packed in a similar fashion. Hemostasis was assured the wounds were irrigated and closed with interrupted Vicryl sutures.  Sterile occlusive dressings were placed. Retractor times were:  L 45: 17:30 minutes; L 23: 12:30 minutes.   Patient was then turned into a prone position on the operating table on [place] table  and using AP and lateral fluoroscopy throughout this portion of the procedure, pedicle screws were placed using Reline  Nuvasive screws. First, through a midline incision, the previously placed hardware was exposed, caps and rods were removed.  The prior fusion was explored.  There was bridging bone with no loosening of the hardware.  There appeared  to be a solid arthrodesis at L 3 through L 4.  The L 5 and L 2 levels were exposed and  2 screws were placed at L2 and (5.5 x 40 mm right and 5.5 x 45 mm left) and 2 at L5  (6.5 x 45). 100 mm rod was then affixed to the screw heads and locked down on the screws on the left and 100 mm rod on the right. The posterolateral region was packed with allograft (40 cc total from L 2 - L 5 levels bilaterally). The wounds were irrigated and then closed with 1, 2-0 and 3-0 Vicryl stitches. Long-acting Marcaine was injected in the deep musculature.  Sterile occlusive dressing was placed with Dermabond. The patient was then extubated in the operating room and taken to recovery in stable and satisfactory condition having tolerated her operation well. Counts were correct at the end of the case.  Pelvic Parameters:  Preop: PT 20; PI 52; LL-47; PI-LL 5  PLAN OF CARE: Admit to inpatient   PATIENT DISPOSITION:  PACU - hemodynamically stable.   Delay start of Pharmacological VTE agent (>24hrs) due to surgical blood loss or risk of bleeding: yes

## 2018-12-28 NOTE — Progress Notes (Signed)
Patient ID: Crystal Lloyd, female   DOB: July 02, 1945, 73 y.o.   MRN: [id number] Alert, conversant. Reports mild lumbar pain, responding well to meds. Good strength BLE. No leg pain or numbness.

## 2018-12-28 NOTE — Brief Op Note (Signed)
12/28/2018  11:48 AM  PATIENT:  Crystal Lloyd  73 y.o. female  PRE-OPERATIVE DIAGNOSIS:  Spinal stenosis, Lumbar region with neurogenic claudication, lumbar spondylolisthesis, lumbago, radiculopathy  POST-OPERATIVE DIAGNOSIS:   Spinal stenosis, Lumbar region with neurogenic claudication, lumbar spondylolisthesis, lumbago, radiculopathy   PROCEDURE:  Procedure(s) with comments: Lumbar 2-3, Lumbar 4-5 Anterolateral lumbar interbody fusion with posterior fixation and exploration/revision of adjacent level fusion (N/A) - Lumbar 2-3, Lumbar 4-5 Anterolateral lumbar interbody fusion with posterior fixation and exploration/revision of adjacent level fusion with posterolateral arthrodesis LUMBAR PEDICLE SCREW 2 LEVEL (N/A)  SURGEON:  Surgeon(s) and Role:    Erline Levine, MD - Primary    * Newman Pies, MD - Assisting  PHYSICIAN ASSISTANT:   ASSISTANTS: Poteat, RN   ANESTHESIA:   general  EBL:  25 mL   BLOOD ADMINISTERED:none  DRAINS: none   LOCAL MEDICATIONS USED:  MARCAINE    and LIDOCAINE   SPECIMEN:  No Specimen  DISPOSITION OF SPECIMEN:  N/A  COUNTS:  YES  TOURNIQUET:  * No tourniquets in log *  DICTATION: Patient is a 74 year old with severe spondylosis stenosis and spondylolisthesis of the lumbar spine. It was elected to take her to surgery for anterolateral decompression and posterior pedicle screw fixation.  The patient has a prior-fusion at L 34.  Procedure: Patient was brought to the operating room and placed in a lateral decubitus position on the operative table and using orthogonally projected C-arm fluoroscopy the patient was placed so that the L2-3 and L 45  levels were visualized in AP and lateral plane. The patient was then taped into position. The table was flexed so as to expose the L 45 level as the patient has a high iliac crest. Skin was marked along with a posterior finger dissection incision. Her flank was then prepped and draped in usual sterile  fashion and incisions were made sequentially at L 45  and L2-3 levels. Posterior finger dissection was made to enter the retroperitoneal space and then subsequently the probe was inserted into the psoas muscle from the right side initially at the L 45 level. After mapping the neural elements were able to dock the probe per the midpoint of this vertebral level and without indications electrically of too close proximity to the neural tissues. Subsequently the self-retaining tractor was.after sequential dilators were utilized the shim was employed and the interspace was cleared of psoas muscle and then incised. A thorough discectomy was performed. Instruments were used to clear the interspace of disc material.An anterior entry with posterior trajectory was performed to avoid neural elements.   After thorough discectomy was performed and this was performed using AP and lateral fluoroscopy a 10 lordotic by 50 x 18 mm implant was packed with extra small BMP and Attrax. This was tamped into position and its position was confirmed on AP and lateral fluoroscopy. Subsequently exposure was performed at the L 23 level and similar dissection was performed with locking of the self-retaining retractor. At this level were able to place an 8 lordotic by 18 x  45 mm implant packed in a similar fashion. Hemostasis was assured the wounds were irrigated and closed with interrupted Vicryl sutures.  Sterile occlusive dressings were placed. Retractor times were:  L 45: 17:30 minutes; L 23: 12:30 minutes.   Patient was then turned into a prone position on the operating table on [place] table  and using AP and lateral fluoroscopy throughout this portion of the procedure, pedicle screws were placed using Reline  Nuvasive screws. First, through a midline incision, the previously placed hardware was exposed, caps and rods were removed.  The prior fusion was explored.  There was bridging bone with no loosening of the hardware.  There appeared  to be a solid arthrodesis at L 3 through L 4.  The L 5 and L 2 levels were exposed and  2 screws were placed at L2 and (5.5 x 40 mm right and 5.5 x 45 mm left) and 2 at L5  (6.5 x 45). 100 mm rod was then affixed to the screw heads and locked down on the screws on the left and 100 mm rod on the right. The posterolateral region was packed with allograft (40 cc total from L 2 - L 5 levels bilaterally). The wounds were irrigated and then closed with 1, 2-0 and 3-0 Vicryl stitches. Long-acting Marcaine was injected in the deep musculature.  Sterile occlusive dressing was placed with Dermabond. The patient was then extubated in the operating room and taken to recovery in stable and satisfactory condition having tolerated her operation well. Counts were correct at the end of the case.  Pelvic Parameters:  Preop: PT 20; PI 52; LL-47; PI-LL 5  PLAN OF CARE: Admit to inpatient   PATIENT DISPOSITION:  PACU - hemodynamically stable.   Delay start of Pharmacological VTE agent (>24hrs) due to surgical blood loss or risk of bleeding: yes

## 2018-12-28 NOTE — Anesthesia Procedure Notes (Signed)
Procedure Name: Intubation Date/Time: 12/28/2018 7:45 AM Performed by: Glynda Jaeger, CRNA Pre-anesthesia Checklist: Patient identified, Patient being monitored, Timeout performed, Emergency Drugs available and Suction available Patient Re-evaluated:Patient Re-evaluated prior to induction Oxygen Delivery Method: Circle System Utilized Preoxygenation: Pre-oxygenation with 100% oxygen Induction Type: IV induction Ventilation: Mask ventilation without difficulty Laryngoscope Size: Mac and 3 Grade View: Grade I Tube type: Oral Tube size: 7.5 mm Number of attempts: 1 Airway Equipment and Method: Stylet Placement Confirmation: ETT inserted through vocal cords under direct vision,  positive ETCO2 and breath sounds checked- equal and bilateral Secured at: 23 cm Tube secured with: Tape Dental Injury: Teeth and Oropharynx as per pre-operative assessment

## 2018-12-28 NOTE — Transfer of Care (Signed)
Immediate Anesthesia Transfer of Care Note  Patient: Crystal Lloyd  Procedure(s) Performed: Lumbar Two-Three, Lumbar Four-Five Anterolateral lumbar interbody fusion with exploration/revision of adjacent level fusion (N/A Spine Lumbar) PLACEMENT OF PEDICLE SCREWS LUMBAR TWO-THREE, LUMBAR FOUR-FIVE (N/A Spine Lumbar)  Patient Location: PACU  Anesthesia Type:General  Level of Consciousness: awake, drowsy, patient cooperative and responds to stimulation  Airway & Oxygen Therapy: Patient Spontanous Breathing and Patient connected to face mask oxygen  Post-op Assessment: Report given to RN, Post -op Vital signs reviewed and stable and Patient moving all extremities X 4  Post vital signs: Reviewed and stable  Last Vitals:  Vitals Value Taken Time  BP 134/93 12/28/18 1202  Temp    Pulse 66 12/28/18 1203  Resp 18 12/28/18 1203  SpO2 100 % 12/28/18 1203  Vitals shown include unvalidated device data.  Last Pain:  Vitals:   12/28/18 0603  PainSc: 0-No pain      Patients Stated Pain Goal: 1 ([id number])  Complications: No apparent anesthesia complications

## 2018-12-29 MED ORDER — METHOCARBAMOL 500 MG PO TABS: 500.0000 mg | ORAL_TABLET | Freq: Four times a day (QID) | ORAL | 1 refills | Status: DC | PRN

## 2018-12-29 MED ORDER — DOCUSATE SODIUM 100 MG PO CAPS: 100.0000 mg | ORAL_CAPSULE | Freq: Two times a day (BID) | ORAL | 0 refills | Status: DC

## 2018-12-29 MED ORDER — HYDROMORPHONE HCL 2 MG PO TABS: 2.0000 mg | ORAL_TABLET | ORAL | 0 refills | Status: DC | PRN

## 2018-12-29 NOTE — Discharge Summary (Signed)
Physician Discharge Summary  Patient ID: Crystal Lloyd MRN: [id number] DOB/AGE: 1945/05/22 73 y.o.  Admit date: 12/28/2018 Discharge date: 12/29/2018  Admission Diagnoses:  Discharge Diagnoses:  Active Problems:   Spondylolisthesis of lumbar region   Discharged Condition: good  Hospital Course: Patient was admitted following L2-3, L4-5 anterolateral lumbar interbody fusion with posterior fixation and exploration/revision of adjacent level fusion with posterolateral arthrodesis. She is doing well post-operatively. She has worked with therapies and ambulated in the hall. She is ready to discharge home.  Consults: rehabilitation medicine  Significant Diagnostic Studies: radiology: Dg Lumbar Spine 2-3 Views  Result Date: 12/28/2018 CLINICAL DATA:  Lumbar fusion EXAM: LUMBAR SPINE - 2-3 VIEW; DG C-ARM 1-60 MIN COMPARISON:  10/31/2018 FINDINGS: 3 C-arm fluoroscopic images were obtained intraoperatively and submitted for post operative interpretation. Interval extension of fusion hardware within the lumbar spine now spanning L2-L5. 3 minutes and 14 seconds of fluoroscopy time was utilized. Please see the performing provider's procedural report for further detail. IMPRESSION: As above. Electronically Signed   By: Davina Poke M.D.   On: 12/28/2018 11:41   Dg C-arm 1-60 Min  Result Date: 12/28/2018 CLINICAL DATA:  Lumbar fusion EXAM: LUMBAR SPINE - 2-3 VIEW; DG C-ARM 1-60 MIN COMPARISON:  10/31/2018 FINDINGS: 3 C-arm fluoroscopic images were obtained intraoperatively and submitted for post operative interpretation. Interval extension of fusion hardware within the lumbar spine now spanning L2-L5. 3 minutes and 14 seconds of fluoroscopy time was utilized. Please see the performing provider's procedural report for further detail. IMPRESSION: As above. Electronically Signed   By: Davina Poke M.D.   On: 12/28/2018 11:41     Treatments: surgery: L2-3, L4-5 anterolateral lumbar interbody  fusion with posterior fixation and exploration/revision of adjacent level fusion with posterolateral arthrodesis  Discharge Exam: Blood pressure (!) 101/54, pulse 60, temperature 98.2 F (36.8 C), resp. rate 16, height 4' 11.5" (1.511 m), weight 54.5 kg, last menstrual period 04/24/1997, SpO2 96 %.   Alert and oriented x 4 PERRLA CN II-XII grossly intact MAE, Strength and sensation intact Incision is covered with Honeycomb dressing and Steri Strips; Dressing is clean, dry, and intact   Disposition: Discharge disposition: 01-Home or Self Care        Allergies as of 12/29/2018      Reactions   Ativan [lorazepam] Other (See Comments)   "depression" terrible headache   Codeine Nausea And Vomiting   Compazine [prochlorperazine Edisylate] Other (See Comments)   "twitching"   Morphine And Related Nausea And Vomiting   Phenergan [promethazine Hcl] Other (See Comments)   twitching      Medication List    STOP taking these medications   ibuprofen 200 MG tablet Commonly known as: ADVIL     TAKE these medications   acetaminophen 500 MG tablet Commonly known as: TYLENOL Take 1,000 mg by mouth every 6 (six) hours as needed (pain.).   ciclopirox 8 % solution Commonly known as: PENLAC Apply 1 application topically daily. Applied to toe nails in the evening.   docusate sodium 100 MG capsule Commonly known as: COLACE Take 1 capsule (100 mg total) by mouth 2 (two) times daily.   ergocalciferol 1.25 MG (50000 UT) capsule Commonly known as: VITAMIN D2 Take 50,000 Units by mouth every Sunday.   gabapentin 100 MG capsule Commonly known as: NEURONTIN Take 100 mg by mouth 3 (three) times daily.   HYDROmorphone 2 MG tablet Commonly known as: DILAUDID Take 1 tablet (2 mg total) by mouth every 4 (four) hours  as needed for severe pain.   levothyroxine 75 MCG tablet Commonly known as: SYNTHROID Take 75 mcg by mouth daily before breakfast.   methocarbamol 500 MG tablet Commonly  known as: ROBAXIN Take 1 tablet (500 mg total) by mouth every 6 (six) hours as needed for muscle spasms.   polyethylene glycol 17 g packet Commonly known as: MIRALAX / GLYCOLAX Take 17 g by mouth daily. With coffee   Repatha SureClick [id number] MG/ML Soaj Generic drug: Evolocumab Inject 140 mg into the skin every 14 (fourteen) days.   Systane 0.4-0.3 % Soln Generic drug: Polyethyl Glycol-Propyl Glycol Place 1 drop into both eyes 2 (two) times daily as needed (dry eyes).   Vascepa 1 g capsule Generic drug: icosapent Ethyl Take 2 g by mouth 2 (two) times daily.      Follow-up Information    Erline Levine, MD Follow up in 2 week(s).   Specialty: Neurosurgery Contact information: [address] [place] [address] [place] [address] [phone]           Signed: Patricia Nettle 12/29/2018, 9:27 AM

## 2018-12-29 NOTE — Evaluation (Signed)
Occupational Therapy Evaluation and Discharge Patient Details Name: Crystal Lloyd MRN: [id number] DOB: [birth year] Today's Date: 12/29/2018    History of Present Illness Pt is a 73 y/o female s/p Lumbar 2-3, Lumbar 4-5 Anterolateral lumbar interbody fusion with posterior fixation. PMH including but not limited to breast cancer (s/p bilateral mastectomies) and previous back surgery.   Clinical Impression   All education completed with pt verbalizing and/or demonstrating understanding. No further OT needs.    Follow Up Recommendations  No OT follow up    Equipment Recommendations  None recommended by OT    Recommendations for Other Services       Precautions / Restrictions Precautions Precautions: Fall;Back Precaution Booklet Issued: Yes (comment) Precaution Comments: reviewed back precautions related to ADL and IADL Required Braces or Orthoses: Spinal Brace Spinal Brace: Lumbar corset;Applied in sitting position Restrictions Weight Bearing Restrictions: No      Mobility Bed Mobility Overal bed mobility: Modified Independent             General bed mobility comments: used log roll technique  Transfers Overall transfer level: Modified independent          General transfer comment: slow to rise    Balance Overall balance assessment: Needs assistance Sitting-balance support: Feet supported Sitting balance-Leahy Scale: Good     Standing balance support: No upper extremity supported Standing balance-Leahy Scale: Fair                             ADL either performed or assessed with clinical judgement   ADL Overall ADL's : Modified independent                                       General ADL Comments: Assisted to don tights, pt otherwise modified independent. Educated in two cup method for toothbrushing, washing face with wash cloth at sink, avoiding twisiting with pericare and use of long handled bath sponge for reaching  back and feet. Pt has assistance for housekeeping, educated in IADL to avoid.     Vision Patient Visual Report: No change from baseline       Perception     Praxis      Pertinent Vitals/Pain Pain Assessment: Faces Faces Pain Scale: Hurts little more Pain Location: back Pain Descriptors / Indicators: Sore Pain Intervention(s): Monitored during session;Premedicated before session;Repositioned     Hand Dominance Right   Extremity/Trunk Assessment Upper Extremity Assessment Upper Extremity Assessment: Overall WFL for tasks assessed   Lower Extremity Assessment Lower Extremity Assessment: Defer to PT evaluation   Cervical / Trunk Assessment Cervical / Trunk Assessment: Other exceptions Cervical / Trunk Exceptions: s/p lumbar sx   Communication Communication Communication: HOH   Cognition Arousal/Alertness: Awake/alert Behavior During Therapy: WFL for tasks assessed/performed Overall Cognitive Status: Within Functional Limits for tasks assessed                                     General Comments       Exercises     Shoulder Instructions      Home Living Family/patient expects to be discharged to:: Private residence Living Arrangements: Spouse/significant other Available Help at Discharge: Family;Available 24 hours/day Type of Home: House Home Access: Stairs to enter [employer] of Steps: 2-3   Home  Layout: Two level     Bathroom Shower/Tub: Occupational psychologist: Handicapped height(toilet riser)     Home Equipment: Cane - single point;Shower seat;Adaptive equipment;Toilet riser Adaptive Equipment: Reacher        Prior Functioning/Environment Level of Independence: Independent        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 Problem List:        OT Treatment/Interventions:      OT Goals(Current goals can be found in the care plan section) Acute Rehab OT Goals Patient Stated Goal: "home today" and to  be done with back surgeries  OT Frequency:     Barriers to D/C:            Co-evaluation              AM-PAC OT "6 Clicks" Daily Activity     Outcome Measure Help from another person eating meals?: None Help from another person taking care of personal grooming?: None Help from another person toileting, which includes using toliet, bedpan, or urinal?: None Help from another person bathing (including washing, rinsing, drying)?: None Help from another person to put on and taking off regular upper body clothing?: None Help from another person to put on and taking off regular lower body clothing?: None 6 Click Score: 24   End of Session Equipment Utilized During Treatment: Back brace  Activity Tolerance: Patient tolerated treatment well Patient left: in bed  OT Visit Diagnosis: Unsteadiness on feet (R26.81);Pain                Time: [id number] OT Time Calculation (min): 37 min Charges:  I Eval, 1 Bynum Nestor Lewandowsky, OTR/L Acute Rehabilitation Services Pager: [phone] Office: [phone]  Malka So 12/29/2018, 10:29 AM

## 2018-12-29 NOTE — Discharge Instructions (Signed)

## 2018-12-29 NOTE — Progress Notes (Signed)
Patient is discharged from 3C04 at this time. Alert and in stable condition. IV site d/c'd and instructions read to patient and spouse with understanding verbalized. Left unit via wheelchair with all belongings at side.

## 2018-12-29 NOTE — Evaluation (Signed)
Physical Therapy Evaluation Patient Details Name: Crystal Lloyd MRN: [id number] DOB: [birth year] Today's Date: 12/29/2018   History of Present Illness  Pt is a 73 y/o female s/p Lumbar 2-3, Lumbar 4-5 Anterolateral lumbar interbody fusion with posterior fixation. PMH including but not limited to breast cancer (s/p bilateral mastectomies) and previous back surgery.    Clinical Impression  Pt presented exiting bathroom in room upon arrival and willing to participate in therapy session. Prior to admission, pt reported that she was independent with all functional mobility and ADLs. 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 Pt will have necessary assistance from spouse upon d/c. At the time of evaluation, pt overall at min guard to supervision level with mobility including hallway ambulation without use of an AD. Pt participated in stair training this session as well without difficulties. PT provided pt with back precautions handout and reviewed with pt throughout. PT provided pt education re: back precautions, car transfers and a generalized walking program for pt to initiate upon d/c home. PT answered all of the pt's questions at the end of the session. No further acute PT needs identified at this time. PT signing off.     Follow Up Recommendations No PT follow up    Equipment Recommendations  None recommended by PT    Recommendations for Other Services       Precautions / Restrictions Precautions Precautions: Fall;Back Precaution Booklet Issued: Yes (comment) Precaution Comments: PT provided pt with back precautions handout and reviewed throughout Required Braces or Orthoses: Spinal Brace Spinal Brace: Lumbar corset;Applied in sitting position Restrictions Weight Bearing Restrictions: No      Mobility  Bed Mobility               General bed mobility comments: pt OOB exiting bathroom upon arrival  Transfers Overall transfer level: Needs assistance Equipment used:  None Transfers: Sit to/from Stand Sit to Stand: Supervision         General transfer comment: for safety  Ambulation/Gait Ambulation/Gait assistance: Supervision;Min guard Gait Distance (Feet): 500 Feet Assistive device: None Gait Pattern/deviations: Step-through pattern;Decreased stride length Gait velocity: decreased   General Gait Details: pt with mild instability, drifting R/L but no overt LOB or need for physical assistance, supervision with occasional min guard  Stairs Stairs: Yes Stairs assistance: Min guard Stair Management: One rail Right;Step to pattern;Forwards Number of Stairs: 10 General stair comments: min guard for safety, no LOB, use of hand rail  Wheelchair Mobility    Modified Rankin (Stroke Patients Only)       Balance Overall balance assessment: Needs assistance Sitting-balance support: Feet supported Sitting balance-Leahy Scale: Good     Standing balance support: No upper extremity supported Standing balance-Leahy Scale: Fair                               Pertinent Vitals/Pain Pain Assessment: Faces Faces Pain Scale: Hurts a little bit Pain Location: back Pain Descriptors / Indicators: Sore Pain Intervention(s): Monitored during session;Repositioned    Home Living Family/patient expects to be discharged to:: Private residence Living Arrangements: Spouse/significant other Available Help at Discharge: Family;Available 24 hours/day Type of Home: House Home Access: Stairs to enter   [employer] of Steps: 2-3 Home Layout: Two level Home Equipment: Cane - single point;Shower seat      Prior Function Level of Independence: Independent         Comments: works as a Stage manager  Hand Dominance        Extremity/Trunk Assessment   Upper Extremity Assessment Upper Extremity Assessment: Overall WFL for tasks assessed    Lower Extremity Assessment Lower Extremity Assessment: Overall WFL for tasks  assessed    Cervical / Trunk Assessment Cervical / Trunk Assessment: Other exceptions Cervical / Trunk Exceptions: s/p lumbar sx  Communication   Communication: No difficulties  Cognition Arousal/Alertness: Awake/alert Behavior During Therapy: WFL for tasks assessed/performed Overall Cognitive Status: Within Functional Limits for tasks assessed                                        General Comments      Exercises     Assessment/Plan    PT Assessment Patent does not need any further PT services  PT Problem List         PT Treatment Interventions      PT Goals (Current goals can be found in the Care Plan section)  Acute Rehab PT Goals Patient Stated Goal: "home today" and to be done with back surgeries PT Goal Formulation: All assessment and education complete, DC therapy    Frequency     Barriers to discharge        Co-evaluation               AM-PAC PT "6 Clicks" Mobility  Outcome Measure Help needed turning from your back to your side while in a flat bed without using bedrails?: None Help needed moving from lying on your back to sitting on the side of a flat bed without using bedrails?: None Help needed moving to and from a bed to a chair (including a wheelchair)?: None Help needed standing up from a chair using your arms (e.g., wheelchair or bedside chair)?: None Help needed to walk in hospital room?: None Help needed climbing 3-5 steps with a railing? : None 6 Click Score: 24    End of Session Equipment Utilized During Treatment: Back brace Activity Tolerance: Patient tolerated treatment well Patient left: in chair;with call bell/phone within reach Nurse Communication: Mobility status PT Visit Diagnosis: Other abnormalities of gait and mobility (R26.89);Pain Pain - part of body: (back)    Time: [id number] PT Time Calculation (min) (ACUTE ONLY): 31 min   Charges:   PT Evaluation $PT Eval Low Complexity: 1 Low PT  Treatments $Gait Training: 8-22 mins        Anastasio Champion, DPT  Acute Rehabilitation Services Pager [phone] Office [place] 12/29/2018, 10:07 AM

## 2018-12-31 ENCOUNTER — Encounter (HOSPITAL_COMMUNITY): Payer: Self-pay | Admitting: Neurosurgery

## 2019-01-01 MED FILL — Sodium Chloride IV Soln 0.9%: INTRAVENOUS | Qty: 1000 | Status: AC

## 2019-01-01 MED FILL — Heparin Sodium (Porcine) Inj 1000 Unit/ML: INTRAMUSCULAR | Qty: 30 | Status: AC

## 2019-01-04 DIAGNOSIS — Z20828 Contact with and (suspected) exposure to other viral communicable diseases: Secondary | ICD-10-CM | POA: Diagnosis not present

## 2019-01-09 DIAGNOSIS — M545 Low back pain: Secondary | ICD-10-CM | POA: Diagnosis not present

## 2019-01-09 DIAGNOSIS — M48061 Spinal stenosis, lumbar region without neurogenic claudication: Secondary | ICD-10-CM | POA: Diagnosis not present

## 2019-01-09 DIAGNOSIS — M5416 Radiculopathy, lumbar region: Secondary | ICD-10-CM | POA: Diagnosis not present

## 2019-01-09 DIAGNOSIS — M4316 Spondylolisthesis, lumbar region: Secondary | ICD-10-CM | POA: Diagnosis not present

## 2019-02-12 ENCOUNTER — Telehealth: Payer: Self-pay

## 2019-02-12 NOTE — Telephone Encounter (Signed)
**Note De-Identified Jniyah Dantuono Obfuscation** I have started a Vascepa PA through covermymeds. Key: [id number]

## 2019-02-20 DIAGNOSIS — M4316 Spondylolisthesis, lumbar region: Secondary | ICD-10-CM | POA: Diagnosis not present

## 2019-02-20 DIAGNOSIS — M48062 Spinal stenosis, lumbar region with neurogenic claudication: Secondary | ICD-10-CM | POA: Diagnosis not present

## 2019-02-20 DIAGNOSIS — M48061 Spinal stenosis, lumbar region without neurogenic claudication: Secondary | ICD-10-CM | POA: Diagnosis not present

## 2019-02-20 DIAGNOSIS — M4120 Other idiopathic scoliosis, site unspecified: Secondary | ICD-10-CM | POA: Diagnosis not present

## 2019-03-01 DIAGNOSIS — M545 Low back pain: Secondary | ICD-10-CM | POA: Diagnosis not present

## 2019-03-15 DIAGNOSIS — M545 Low back pain: Secondary | ICD-10-CM | POA: Diagnosis not present

## 2019-03-15 DIAGNOSIS — M6281 Muscle weakness (generalized): Secondary | ICD-10-CM | POA: Diagnosis not present

## 2019-03-26 DIAGNOSIS — M6281 Muscle weakness (generalized): Secondary | ICD-10-CM | POA: Diagnosis not present

## 2019-04-02 DIAGNOSIS — M6281 Muscle weakness (generalized): Secondary | ICD-10-CM | POA: Diagnosis not present

## 2019-04-10 DIAGNOSIS — M6281 Muscle weakness (generalized): Secondary | ICD-10-CM | POA: Diagnosis not present

## 2019-04-12 DIAGNOSIS — L814 Other melanin hyperpigmentation: Secondary | ICD-10-CM | POA: Diagnosis not present

## 2019-04-12 DIAGNOSIS — L603 Nail dystrophy: Secondary | ICD-10-CM | POA: Diagnosis not present

## 2019-04-12 DIAGNOSIS — L570 Actinic keratosis: Secondary | ICD-10-CM | POA: Diagnosis not present

## 2019-04-16 DIAGNOSIS — M6281 Muscle weakness (generalized): Secondary | ICD-10-CM | POA: Diagnosis not present

## 2019-04-22 DIAGNOSIS — M5416 Radiculopathy, lumbar region: Secondary | ICD-10-CM | POA: Diagnosis not present

## 2019-04-22 DIAGNOSIS — M545 Low back pain: Secondary | ICD-10-CM | POA: Diagnosis not present

## 2019-04-22 DIAGNOSIS — M48061 Spinal stenosis, lumbar region without neurogenic claudication: Secondary | ICD-10-CM | POA: Diagnosis not present

## 2019-04-22 DIAGNOSIS — M4120 Other idiopathic scoliosis, site unspecified: Secondary | ICD-10-CM | POA: Diagnosis not present

## 2019-04-30 DIAGNOSIS — M6281 Muscle weakness (generalized): Secondary | ICD-10-CM | POA: Diagnosis not present

## 2019-05-16 DIAGNOSIS — M6281 Muscle weakness (generalized): Secondary | ICD-10-CM | POA: Diagnosis not present

## 2019-06-29 IMAGING — MR MR CERVICAL SPINE W/O CM
4 of 5 series · 29 of 48 positions shown · non-contrast
Comparison: None.

CLINICAL DATA: Neck pain

EXAM:
MRI CERVICAL SPINE WITHOUT CONTRAST
TECHNIQUE: Multiplanar, multisequence MR imaging of the cervical spine was
performed. No intravenous contrast was administered.

[Series 6: T1 · sagittal · 3.0mm · 0.66mm/px · 8 of 15 slices shown]
[im 1/15]
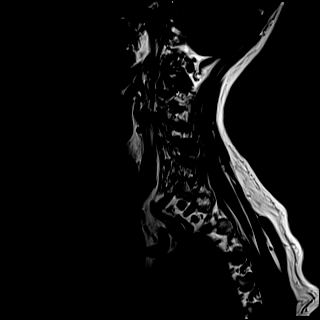
[im 3/15]
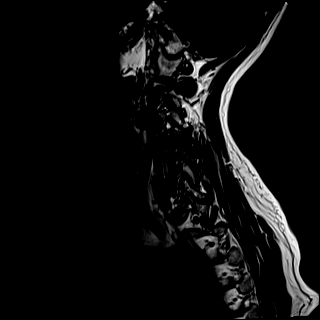
[im 5/15]
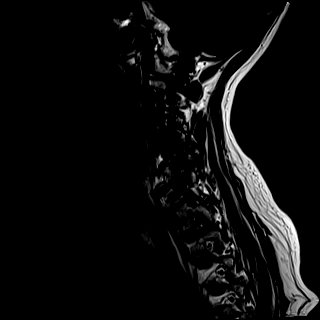
[im 7/15]
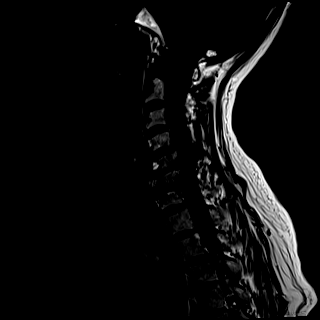
[im 9/15]
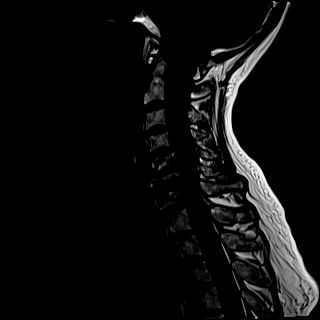
[im 11/15]
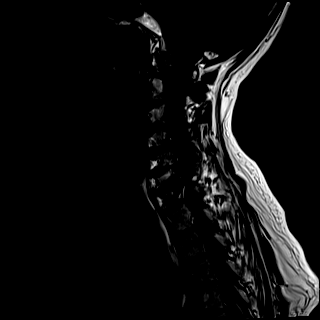
[im 13/15]
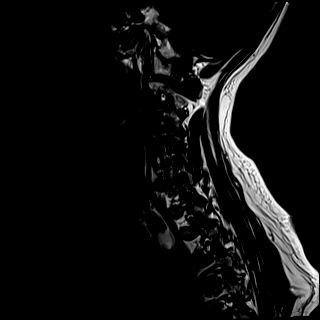
[im 15/15]
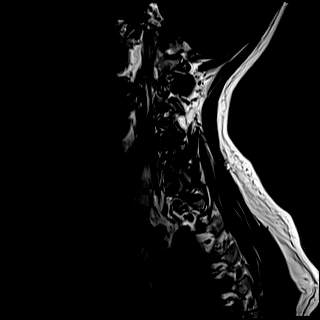

[Series 7: T2 · sagittal · 3.0mm · 0.55mm/px · 7 of 15 slices shown (1 of 2)]
[im 1/15]
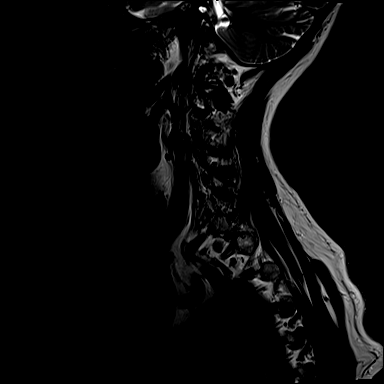
[im 3/15]
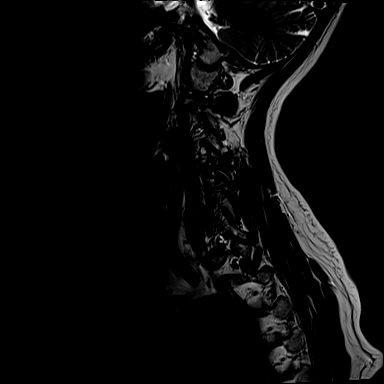
[im 5/15]
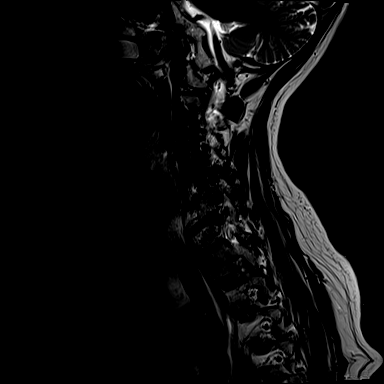
[im 8/15]
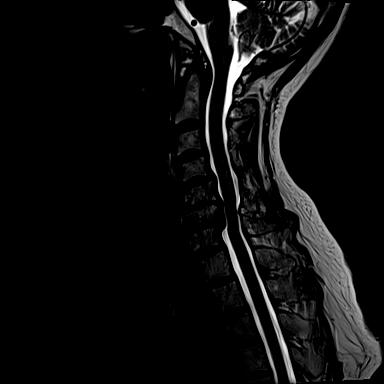
[im 10/15]
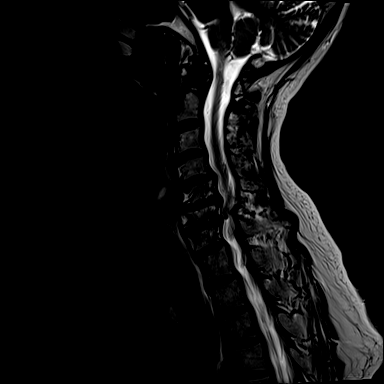
[im 12/15]
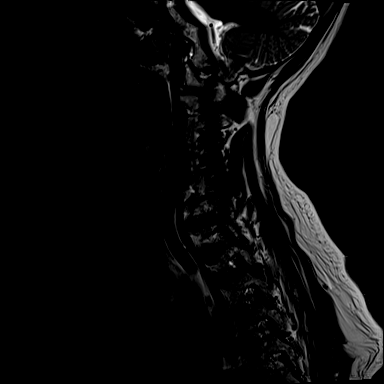
[im 15/15]
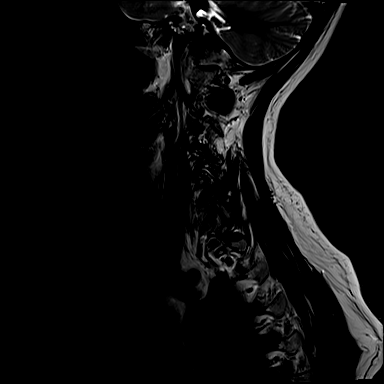

[Series 8: STIR · sagittal · 3.0mm · 0.33mm/px · 5 of 15 slices shown]
[im 1/15]
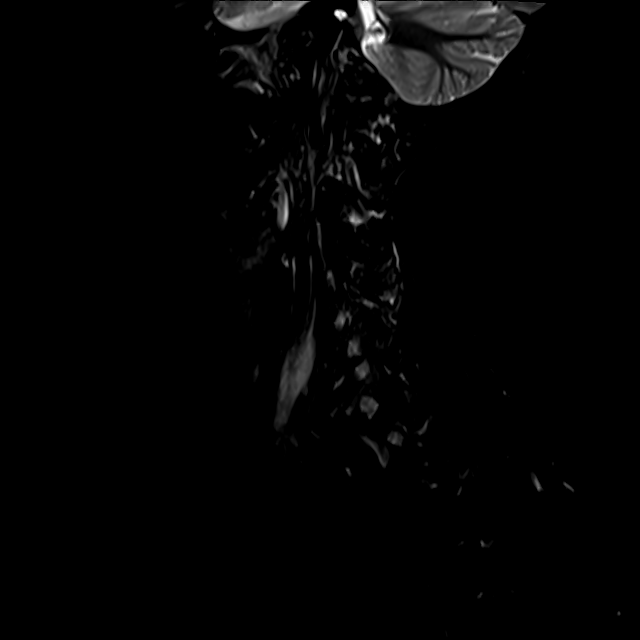
[im 3/15]
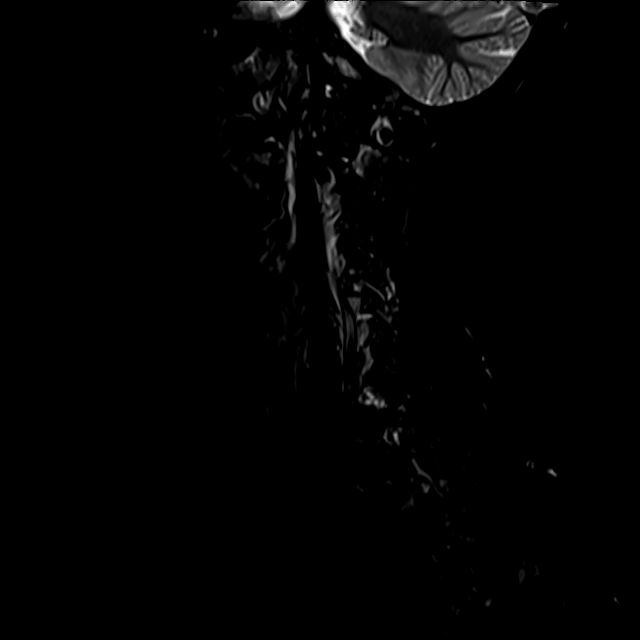
[im 5/15]
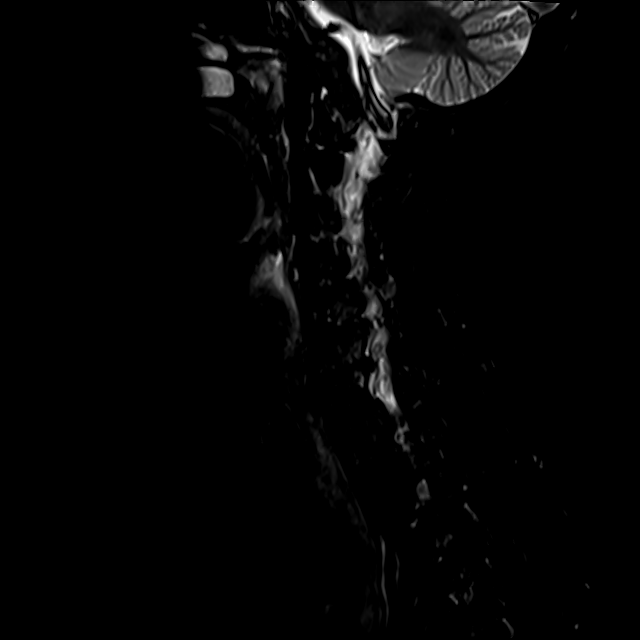
[im 8/15]
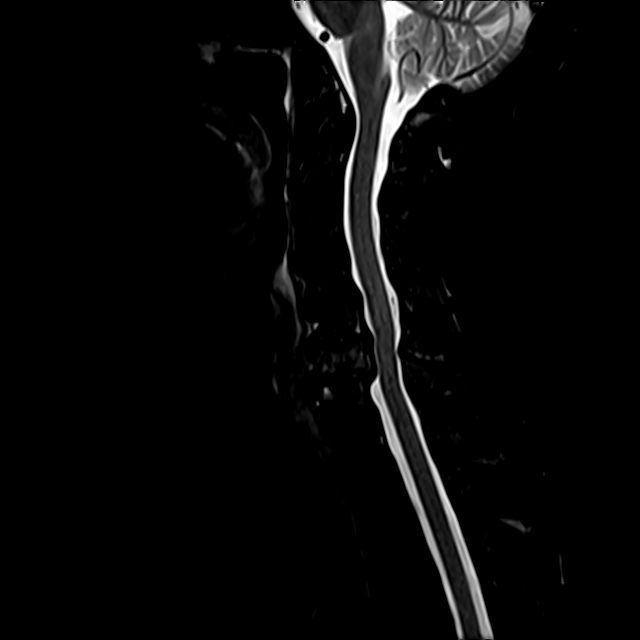
[im 12/15]
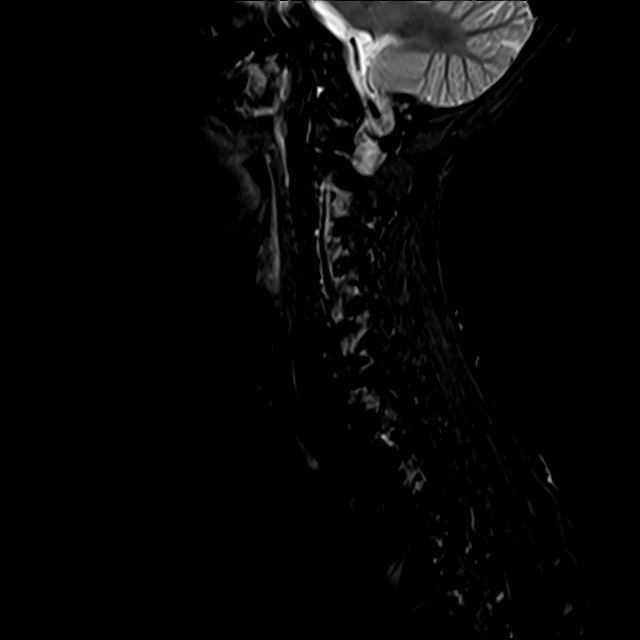

[Series 9: T2 · axial · 3.0mm · 0.50mm/px · z∈[-45,+40]mm · 9 of 28 slices shown (2 of 2)]
[im 1/28]
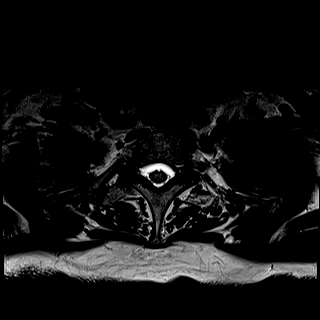
[im 5/28]
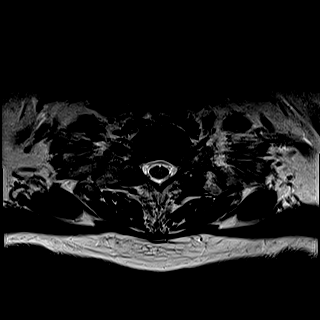
[im 10/28]
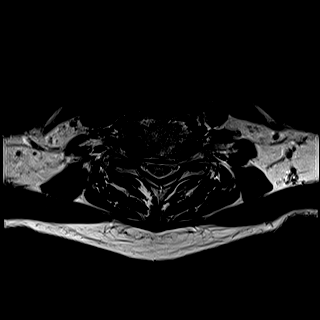
[im 12/28]
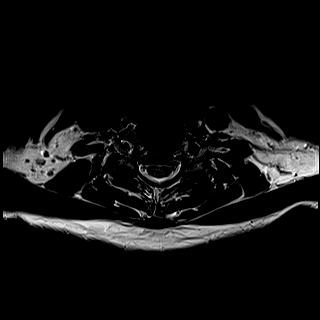
[im 14/28]
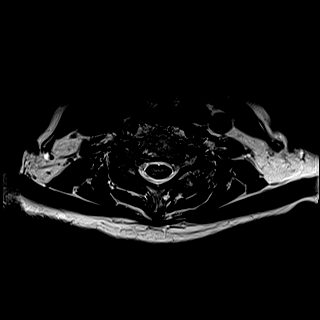
[im 16/28]
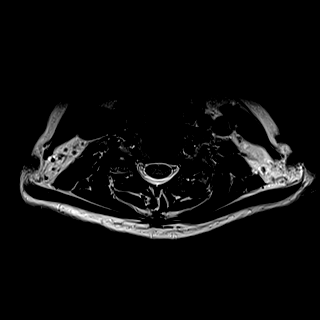
[im 19/28]
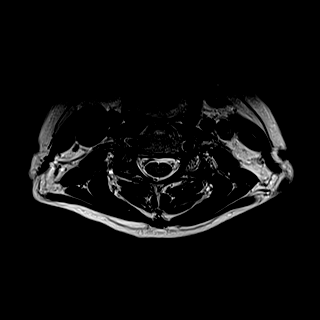
[im 23/28]
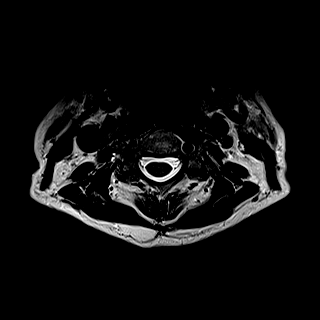
[im 28/28]
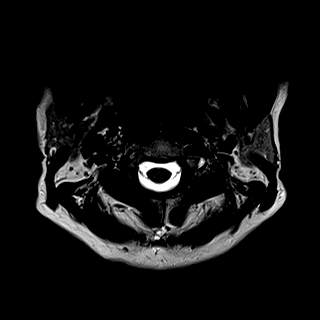

[29 of 48 positions shown; findings below may reference images not displayed]

FINDINGS: Alignment: 3 mm anterolisthesis C4-5.  3 mm retrolisthesis C6-7

Vertebrae: Discogenic changes at C5-6 and C6-7 due to advanced disc
degeneration. Negative for fracture or mass.

Cord: Normal signal and morphology.

Posterior Fossa, vertebral arteries, paraspinal tissues: Negative

Disc levels:

C2-3: Negative

C3-4: Mild left foraminal narrowing due to uncinate spurring and
facet hypertrophy.

C4-5: Moderate foraminal encroachment bilaterally due to diffuse
uncinate spurring and facet hypertrophy. Mild spinal stenosis. 3 mm
anterolisthesis.

C5-6: Advanced disc degeneration with disc space narrowing and
diffuse uncinate spurring. Mild spinal stenosis and moderate
foraminal stenosis bilaterally

C6-7: Advanced disc degeneration with retrolisthesis and diffuse
uncinate spurring. Cord flattening. Moderate spinal stenosis and
moderate foraminal stenosis bilaterally.

C7-T1: Bilateral facet degeneration. Mild foraminal narrowing
bilaterally.
IMPRESSION: Extensive multilevel degenerative changes throughout the cervical
spine most severe at C5-6 and C6-7. Multilevel spinal and foraminal
stenosis as described above. No acute finding.

## 2019-07-15 NOTE — Progress Notes (Deleted)
74 y.o. G2P2002 Married White or Caucasian female here for annual exam.    Patient's last menstrual period was 04/24/1997.          Sexually active: {yes no:314532}  The current method of family planning is post menopausal status.    Exercising: {yes no:314532}  {types:19826} Smoker:  {YES NO:22349}  Health Maintenance: Pap:  01-20-16 neg HPV HR neg, 06-25-2018 neg History of abnormal Pap:  no MMG:  Double mastectomy Colonoscopy:  2019 f/u 34yrs BMD:   08-31-2017 osteoporosis in forearm, f/u 1-55yrs TDaP:  2019 Pneumonia vaccine(s):  2017 Shingrix:   2019 Hep C testing: neg per patient? Screening Labs: ***   reports that she is a non-smoker but has been exposed to tobacco smoke. She has never used smokeless tobacco. She reports current alcohol use of about 1.0 standard drink of alcohol per week. She reports that she does not use drugs.  Past Medical History:  Diagnosis Date  . Asthma   . Cardiac contusion 02/2018  . Chronic kidney disease   . Diverticulitis of colon with bleeding 12/2016   Colonoscopy 01/2017  . Diverticulosis of colon   . Elevated creatine kinase level   . History of anal dysplasia    AIN III  . History of breast cancer    2000--  s/p  bilateral mastectomies, chemo and radiation therapy's//  no recurrence  . History of diverticulitis of colon    hx of a few times w/ only one requiring surgical intervention due to perfation in 2011  . History of kidney stones   . History of scarlet fever   . Hypothyroidism   . Muscle pain   . Orthostatic hypotension   . Osteopenia   . PONV (postoperative nausea and vomiting)   . Seasonal allergies   . Wears glasses   . Wears hearing aid    bilateral    Past Surgical History:  Procedure Laterality Date  . ANTERIOR LAT LUMBAR FUSION N/A 12/28/2018   Procedure: Lumbar Two-Three, Lumbar Four-Five Anterolateral lumbar interbody fusion with exploration/revision of adjacent level fusion;  Surgeon: Erline Levine, MD;  Location:  [facility];  Service: Neurosurgery;  Laterality: N/A;  Lumbar 2-3, Lumbar 4-5 Anterolateral lumbar interbody fusion with posterior fixation and exploration/revision of adjacent level fusion  . APPENDECTOMY  age 69  . ARTHROSCOPIC REPAIR ACL Left 1989   Tendon Graft   . BACK SURGERY  07/2013   L3-4 Fusion  . BREAST SURGERY    . CARDIAC CATHETERIZATION  05-07-2003  dr Sabino Snipes   normal coronary arteries and LVF, ef 70%  . CARDIOVASCULAR STRESS TEST  02-03-2003  dr Martinique   prominent apical thinning but no convinceing evidence for ischemia or infarct otherwise normal perfusion study/  ef 66%  . COLON SURGERY    . COLONOSCOPY  last one 2015  . COLOSTOMY TAKEDOWN  11-24-2009  . EXPLORATORY LAPAROTOMY/  SIGMOID COLECTOMY/ HARTMAN POUCH/ DESCENDING COLECTOMY  08-21-2009   diverticular perforated sigmoid colon w/ fecal peritonitis  . LAPAROSCOPIC BILATERAL SALPINGO OOPHERECTOMY  01-07-2003  . LEFT BREAST LUMPECTOMY  1999  . LUMBAR FUSION  08-22-2013   at [facility]  . LUMBAR PERCUTANEOUS PEDICLE SCREW 2 LEVEL N/A 12/28/2018   Procedure: PLACEMENT OF PEDICLE SCREWS LUMBAR TWO-THREE, LUMBAR FOUR-FIVE;  Surgeon: Erline Levine, MD;  Location: [place];  Service: Neurosurgery;  Laterality: N/A;  . MASTECTOMY Bilateral 2002  . MUSCLE BIOPSY Left 10/21/2014   Procedure: LEFT THIGH MUSCLE BIOPSY;  Surgeon: Armandina Gemma, MD;  Location:  [place];  Service: General;  Laterality: Left;  . OOPHORECTOMY Bilateral 2005  . REMOVAL ANORECTAL POLYP  08/ 2008   [facility]   Dr. Morton Stall  . TONSILLECTOMY  age 88  . TRANSTHORACIC ECHOCARDIOGRAM  01-25-2013   grade I diastolic dysfuntion, ef 72-53%/  mild MR/  trivial TR    Current Outpatient Medications  Medication Sig Dispense Refill  . acetaminophen (TYLENOL) 500 MG tablet Take 1,000 mg by mouth every 6 (six) hours as needed (pain.).    Marland Kitchen ciclopirox (PENLAC) 8 % solution Apply 1 application topically daily. Applied to toe nails in the evening.    .  docusate sodium (COLACE) 100 MG capsule Take 1 capsule (100 mg total) by mouth 2 (two) times daily. 10 capsule 0  . ergocalciferol (VITAMIN D2) 50000 UNITS capsule Take 50,000 Units by mouth every Sunday.     . gabapentin (NEURONTIN) 100 MG capsule Take 100 mg by mouth 3 (three) times daily.    Marland Kitchen HYDROmorphone (DILAUDID) 2 MG tablet Take 1 tablet (2 mg total) by mouth every 4 (four) hours as needed for severe pain. 42 tablet 0  . levothyroxine (SYNTHROID, LEVOTHROID) 75 MCG tablet Take 75 mcg by mouth daily before breakfast.     . methocarbamol (ROBAXIN) 500 MG tablet Take 1 tablet (500 mg total) by mouth every 6 (six) hours as needed for muscle spasms. 28 tablet 1  . Polyethyl Glycol-Propyl Glycol (SYSTANE) 0.4-0.3 % SOLN Place 1 drop into both eyes 2 (two) times daily as needed (dry eyes).    . polyethylene glycol (MIRALAX / GLYCOLAX) packet Take 17 g by mouth daily. With coffee    . REPATHA SURECLICK 664 MG/ML SOAJ Inject 140 mg into the skin every 14 (fourteen) days.    Marland Kitchen VASCEPA 1 g CAPS Take 2 g by mouth 2 (two) times daily.      No current facility-administered medications for this visit.    Family History  Problem Relation Age of Onset  . Asthma Mother   . Emphysema Mother   . Hypertension Mother   . Diverticulitis Mother   . Hypertension Brother   . COPD Brother   . Hypertension Brother   . Cervical cancer Maternal Grandmother   . Cancer Other 54       breast  . Breast cancer Maternal Aunt   . Colon cancer Maternal Aunt   . Asthma Son     Review of Systems  Exam:   LMP 04/24/1997      General appearance: alert, cooperative and appears stated age Head: Normocephalic, without obvious abnormality, atraumatic Neck: no adenopathy, supple, symmetrical, trachea midline and thyroid {EXAM; THYROID:18604} Lungs: clear to auscultation bilaterally Breasts: {Exam; breast:13139::"normal appearance, no masses or tenderness"} Heart: regular rate and rhythm Abdomen: soft,  non-tender; bowel sounds normal; no masses,  no organomegaly Extremities: extremities normal, atraumatic, no cyanosis or edema Skin: Skin color, texture, turgor normal. No rashes or lesions Lymph nodes: Cervical, supraclavicular, and axillary nodes normal. No abnormal inguinal nodes palpated Neurologic: Grossly normal   Pelvic: External genitalia:  no lesions              Urethra:  normal appearing urethra with no masses, tenderness or lesions              Bartholins and Skenes: normal                 Vagina: normal appearing vagina with normal color and discharge, no lesions  Cervix: {exam; cervix:14595}              Pap taken: {yes no:314532} Bimanual Exam:  Uterus:  {exam; uterus:12215}              Adnexa: {exam; adnexa:12223}               Rectovaginal: Confirms               Anus:  normal sphincter tone, no lesions  Chaperone, ***Terence Lux, CMA, was present for exam.  A:  Well Woman with normal exam  P:   {plan; gyn:5269::"mammogram","pap smear","return annually or prn"}

## 2019-07-16 ENCOUNTER — Ambulatory Visit: Payer: Medicare Other | Admitting: Obstetrics & Gynecology

## 2019-08-02 ENCOUNTER — Ambulatory Visit: Payer: Medicare Other | Admitting: Obstetrics & Gynecology

## 2019-08-12 DIAGNOSIS — M4120 Other idiopathic scoliosis, site unspecified: Secondary | ICD-10-CM | POA: Diagnosis not present

## 2019-08-12 DIAGNOSIS — M545 Low back pain: Secondary | ICD-10-CM | POA: Diagnosis not present

## 2019-08-12 DIAGNOSIS — M48061 Spinal stenosis, lumbar region without neurogenic claudication: Secondary | ICD-10-CM | POA: Diagnosis not present

## 2019-08-12 DIAGNOSIS — M5416 Radiculopathy, lumbar region: Secondary | ICD-10-CM | POA: Diagnosis not present

## 2019-08-20 ENCOUNTER — Encounter: Payer: Self-pay | Admitting: Obstetrics & Gynecology

## 2019-08-20 ENCOUNTER — Other Ambulatory Visit: Payer: Self-pay

## 2019-08-20 ENCOUNTER — Other Ambulatory Visit (HOSPITAL_COMMUNITY)
Admission: RE | Admit: 2019-08-20 | Discharge: 2019-08-20 | Disposition: A | Discharge status: Home / Self Care | Payer: Medicare Other | Source: Ambulatory Visit | Attending: Obstetrics & Gynecology | Admitting: Obstetrics & Gynecology

## 2019-08-20 ENCOUNTER — Ambulatory Visit (INDEPENDENT_AMBULATORY_CARE_PROVIDER_SITE_OTHER): Payer: Medicare Other | Admitting: Obstetrics & Gynecology

## 2019-08-20 VITALS — BP 112/60 | HR 72 | Resp 12 | Ht 60.0 in | Wt 117.0 lb

## 2019-08-20 DIAGNOSIS — Z124 Encounter for screening for malignant neoplasm of cervix: Secondary | ICD-10-CM | POA: Insufficient documentation

## 2019-08-20 DIAGNOSIS — M5127 Other intervertebral disc displacement, lumbosacral region: Secondary | ICD-10-CM | POA: Diagnosis not present

## 2019-08-20 DIAGNOSIS — Z01419 Encounter for gynecological examination (general) (routine) without abnormal findings: Secondary | ICD-10-CM | POA: Diagnosis not present

## 2019-08-20 DIAGNOSIS — D013 Carcinoma in situ of anus and anal canal: Secondary | ICD-10-CM | POA: Diagnosis not present

## 2019-08-20 DIAGNOSIS — M5416 Radiculopathy, lumbar region: Secondary | ICD-10-CM | POA: Diagnosis not present

## 2019-08-20 DIAGNOSIS — Z8049 Family history of malignant neoplasm of other genital organs: Secondary | ICD-10-CM

## 2019-08-20 NOTE — Progress Notes (Signed)
74 y.o. G52P2002 Married White or Caucasian female here for annual exam.  Doing well.  Still working part time.  Has 4 grandchildren ages 13-8 yo.  Had lumbar fusion with Dr. Vertell Limber on December.  Has new right lateral right lateral foot numbness and weakness.  Had MRI done today.    Denies vaginal bleeding.    PCP:  Dr. Forde Dandy.  Had virtual visit last late fall/early winter.  Had blood work before the visit.    Patient's last menstrual period was 04/24/1997.          Sexually active: No.  The current method of family planning is post menopausal status.    Exercising: No.  Exercise is limited by back surgery. -- occasional walking Smoker:  no  Health Maintenance: Pap:   06/25/18 Neg  01/05/16 neg. HR HPV:neg              10/31/11 Neg. HR HPV:neg History of abnormal Pap:  no MMG:  Double mastectomy  Colonoscopy:  2019 f/u 3 years, Dr. Earlean Shawl BMD:   2019 Osteoporosis in forearm f/u 1-2 years TDaP:  2019 Pneumonia vaccine(s):  2017 Shingrix:   completed Hep C testing: Negative in the past Screening Labs: PCP   reports that she is a non-smoker but has been exposed to tobacco smoke. She has never used smokeless tobacco. She reports current alcohol use of about 1.0 standard drink of alcohol per week. She reports that she does not use drugs.  Past Medical History:  Diagnosis Date  . Asthma   . Cardiac contusion 02/2018  . Chronic kidney disease   . Diverticulitis of colon with bleeding 12/2016   Colonoscopy 01/2017  . Diverticulosis of colon   . Elevated creatine kinase level   . History of anal dysplasia    AIN III  . History of breast cancer    2000--  s/p  bilateral mastectomies, chemo and radiation therapy's//  no recurrence  . History of diverticulitis of colon    hx of a few times w/ only one requiring surgical intervention due to perfation in 2011  . History of kidney stones   . History of scarlet fever   . Hypothyroidism   . Muscle pain   . Orthostatic hypotension   .  Osteopenia   . PONV (postoperative nausea and vomiting)   . Seasonal allergies   . Wears glasses   . Wears hearing aid    bilateral    Past Surgical History:  Procedure Laterality Date  . ANTERIOR LAT LUMBAR FUSION N/A 12/28/2018   Procedure: Lumbar Two-Three, Lumbar Four-Five Anterolateral lumbar interbody fusion with exploration/revision of adjacent level fusion;  Surgeon: Erline Levine, MD;  Location: [place];  Service: Neurosurgery;  Laterality: N/A;  Lumbar 2-3, Lumbar 4-5 Anterolateral lumbar interbody fusion with posterior fixation and exploration/revision of adjacent level fusion  . APPENDECTOMY  age 60  . ARTHROSCOPIC REPAIR ACL Left 1989   Tendon Graft   . BACK SURGERY  07/2013   L3-4 Fusion  . BREAST SURGERY    . CARDIAC CATHETERIZATION  05-07-2003  dr Sabino Snipes   normal coronary arteries and LVF, ef 70%  . CARDIOVASCULAR STRESS TEST  02-03-2003  dr Martinique   prominent apical thinning but no convinceing evidence for ischemia or infarct otherwise normal perfusion study/  ef 66%  . COLON SURGERY    . COLONOSCOPY  last one 2015  . COLOSTOMY TAKEDOWN  11-24-2009  . EXPLORATORY LAPAROTOMY/  SIGMOID COLECTOMY/ HARTMAN POUCH/ DESCENDING COLECTOMY  08-21-2009   diverticular perforated sigmoid colon w/ fecal peritonitis  . LAPAROSCOPIC BILATERAL SALPINGO OOPHERECTOMY  01-07-2003  . LEFT BREAST LUMPECTOMY  1999  . LUMBAR FUSION  08-22-2013   at [facility]  . LUMBAR PERCUTANEOUS PEDICLE SCREW 2 LEVEL N/A 12/28/2018   Procedure: PLACEMENT OF PEDICLE SCREWS LUMBAR TWO-THREE, LUMBAR FOUR-FIVE;  Surgeon: Erline Levine, MD;  Location: [place];  Service: Neurosurgery;  Laterality: N/A;  . MASTECTOMY Bilateral 2002  . MUSCLE BIOPSY Left 10/21/2014   Procedure: LEFT THIGH MUSCLE BIOPSY;  Surgeon: Armandina Gemma, MD;  Location: [facility];  Service: General;  Laterality: Left;  . OOPHORECTOMY Bilateral 2005  . REMOVAL ANORECTAL POLYP  08/ 2008   [facility]   Dr. Morton Stall  . TONSILLECTOMY   age 75  . TRANSTHORACIC ECHOCARDIOGRAM  01-25-2013   grade I diastolic dysfuntion, ef 72-53%/  mild MR/  trivial TR    Current Outpatient Medications  Medication Sig Dispense Refill  . acetaminophen (TYLENOL) 500 MG tablet Take 1,000 mg by mouth every 6 (six) hours as needed (pain.).    Marland Kitchen ciclopirox (PENLAC) 8 % solution Apply 1 application topically daily. Applied to toe nails in the evening.    . ergocalciferol (VITAMIN D2) 50000 UNITS capsule Take 50,000 Units by mouth every Sunday.     . levothyroxine (SYNTHROID, LEVOTHROID) 75 MCG tablet Take 75 mcg by mouth daily before breakfast.     . methocarbamol (ROBAXIN) 500 MG tablet Take 1 tablet (500 mg total) by mouth every 6 (six) hours as needed for muscle spasms. 28 tablet 1  . Polyethyl Glycol-Propyl Glycol (SYSTANE) 0.4-0.3 % SOLN Place 1 drop into both eyes 2 (two) times daily as needed (dry eyes).    . polyethylene glycol (MIRALAX / GLYCOLAX) packet Take 17 g by mouth daily. With coffee    . REPATHA SURECLICK 664 MG/ML SOAJ Inject 140 mg into the skin every 14 (fourteen) days.    Marland Kitchen VASCEPA 1 g CAPS Take 2 g by mouth 2 (two) times daily.  (Patient not taking: Reported on 08/20/2019)     No current facility-administered medications for this visit.    Family History  Problem Relation Age of Onset  . Asthma Mother   . Emphysema Mother   . Hypertension Mother   . Diverticulitis Mother   . Hypertension Brother   . COPD Brother   . Hypertension Brother   . Cervical cancer Maternal Grandmother   . Cancer Other 54       breast  . Breast cancer Maternal Aunt   . Colon cancer Maternal Aunt   . Asthma Son     Review of Systems  All other systems reviewed and are negative.   Exam:   BP (!) 112/60 (BP Location: Right Arm, Patient Position: Sitting, Cuff Size: Normal)   Pulse 72   Resp 12   Ht 5' (1.524 m)   Wt 117 lb (53.1 kg)   LMP 04/24/1997   BMI 22.85 kg/m   Height: 5' (152.4 cm)  General appearance: alert, cooperative  and appears stated age Head: Normocephalic, without obvious abnormality, atraumatic Neck: no adenopathy, supple, symmetrical, trachea midline and thyroid normal to inspection and palpation Lungs: clear to auscultation bilaterally Breasts: surgically absent with bilateral implants, no masses, LAD, skin changes Heart: regular rate and rhythm Abdomen: soft, non-tender; bowel sounds normal; no masses,  no organomegaly Extremities: extremities normal, atraumatic, no cyanosis or edema Skin: Skin color, texture, turgor normal. No rashes or lesions Lymph nodes: Cervical,  supraclavicular, and axillary nodes normal. No abnormal inguinal nodes palpated Neurologic: Grossly normal   Pelvic: External genitalia:  no lesions              Urethra:  normal appearing urethra with no masses, tenderness or lesions              Bartholins and Skenes: normal                 Vagina: normal appearing vagina with normal color and discharge, no lesions              Cervix: normal in apperance              Pap taken: Yes.   Bimanual Exam:  Uterus:  Non enlarged, no masses              Adnexa: no mass, fullness, tenderness               Rectovaginal: Confirms               Anus:  normal sphincter tone, no lesions  A:  Well Woman with normal exam H/o breast cancer, s/p bilateral mastectomy H/o colectomy with colostomy then colostomy takedown (significant diverticular disease) H/o BSO 2004 by Dr. Fermin Schwab Elevated lipids (has not been able to tolerate statins) Hypothyroidism H/o transient global amnesia (possible TIA per Dr. 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 Morton Stall for 5 years  P:   Mammogram guidelines reviewed pap smear neg 2020.  Pt desires pap smear be obtained today. Plan to repeat BMD 1-2 years Colonoscopy due 2022 Lab work done with Dr. Forde Dandy Return annually or prn

## 2019-08-22 ENCOUNTER — Encounter: Payer: Self-pay | Admitting: Obstetrics & Gynecology

## 2019-08-22 LAB — CYTOLOGY - PAP: Diagnosis: NEGATIVE

## 2019-08-26 DIAGNOSIS — M48062 Spinal stenosis, lumbar region with neurogenic claudication: Secondary | ICD-10-CM | POA: Diagnosis not present

## 2019-08-26 DIAGNOSIS — M4120 Other idiopathic scoliosis, site unspecified: Secondary | ICD-10-CM | POA: Diagnosis not present

## 2019-08-26 DIAGNOSIS — M545 Low back pain: Secondary | ICD-10-CM | POA: Diagnosis not present

## 2019-08-26 DIAGNOSIS — M5416 Radiculopathy, lumbar region: Secondary | ICD-10-CM | POA: Diagnosis not present

## 2019-09-10 DIAGNOSIS — M545 Low back pain: Secondary | ICD-10-CM | POA: Diagnosis not present

## 2019-09-10 DIAGNOSIS — M6281 Muscle weakness (generalized): Secondary | ICD-10-CM | POA: Diagnosis not present

## 2019-09-24 DIAGNOSIS — H524 Presbyopia: Secondary | ICD-10-CM | POA: Diagnosis not present

## 2019-09-25 DIAGNOSIS — M6281 Muscle weakness (generalized): Secondary | ICD-10-CM | POA: Diagnosis not present

## 2019-09-25 DIAGNOSIS — M545 Low back pain: Secondary | ICD-10-CM | POA: Diagnosis not present

## 2019-10-02 DIAGNOSIS — M8589 Other specified disorders of bone density and structure, multiple sites: Secondary | ICD-10-CM | POA: Diagnosis not present

## 2019-10-21 DIAGNOSIS — E039 Hypothyroidism, unspecified: Secondary | ICD-10-CM | POA: Diagnosis not present

## 2019-10-21 DIAGNOSIS — R7301 Impaired fasting glucose: Secondary | ICD-10-CM | POA: Diagnosis not present

## 2019-10-21 DIAGNOSIS — E785 Hyperlipidemia, unspecified: Secondary | ICD-10-CM | POA: Diagnosis not present

## 2019-10-21 DIAGNOSIS — E559 Vitamin D deficiency, unspecified: Secondary | ICD-10-CM | POA: Diagnosis not present

## 2019-10-28 DIAGNOSIS — E785 Hyperlipidemia, unspecified: Secondary | ICD-10-CM | POA: Diagnosis not present

## 2019-10-28 DIAGNOSIS — M6281 Muscle weakness (generalized): Secondary | ICD-10-CM | POA: Diagnosis not present

## 2019-10-28 DIAGNOSIS — Z0000 Encounter for general adult medical examination without abnormal findings: Secondary | ICD-10-CM | POA: Diagnosis not present

## 2019-10-28 DIAGNOSIS — G4700 Insomnia, unspecified: Secondary | ICD-10-CM | POA: Diagnosis not present

## 2019-10-28 DIAGNOSIS — E039 Hypothyroidism, unspecified: Secondary | ICD-10-CM | POA: Diagnosis not present

## 2019-10-28 DIAGNOSIS — R82998 Other abnormal findings in urine: Secondary | ICD-10-CM | POA: Diagnosis not present

## 2019-11-02 DIAGNOSIS — Z23 Encounter for immunization: Secondary | ICD-10-CM | POA: Diagnosis not present

## 2019-11-04 ENCOUNTER — Ambulatory Visit: Payer: Medicare Other | Admitting: Obstetrics & Gynecology

## 2019-11-04 DIAGNOSIS — M5459 Other low back pain: Secondary | ICD-10-CM | POA: Diagnosis not present

## 2019-11-04 DIAGNOSIS — M6281 Muscle weakness (generalized): Secondary | ICD-10-CM | POA: Diagnosis not present

## 2019-11-18 DIAGNOSIS — M5459 Other low back pain: Secondary | ICD-10-CM | POA: Diagnosis not present

## 2019-11-18 DIAGNOSIS — M6281 Muscle weakness (generalized): Secondary | ICD-10-CM | POA: Diagnosis not present
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
## 2019-11-22 ENCOUNTER — Encounter: Payer: Self-pay | Admitting: Interventional Cardiology

## 2019-11-22 ENCOUNTER — Other Ambulatory Visit: Payer: Self-pay

## 2019-11-22 ENCOUNTER — Ambulatory Visit: Payer: Medicare Other | Admitting: Interventional Cardiology

## 2019-11-22 VITALS — BP 96/60 | HR 62 | Ht 60.0 in | Wt 120.2 lb

## 2019-11-22 DIAGNOSIS — E782 Mixed hyperlipidemia: Secondary | ICD-10-CM | POA: Diagnosis not present

## 2019-11-22 DIAGNOSIS — R931 Abnormal findings on diagnostic imaging of heart and coronary circulation: Secondary | ICD-10-CM

## 2019-11-22 DIAGNOSIS — I5181 Takotsubo syndrome: Secondary | ICD-10-CM | POA: Diagnosis not present

## 2019-11-22 NOTE — Patient Instructions (Signed)

## 2020-01-02 DIAGNOSIS — M5416 Radiculopathy, lumbar region: Secondary | ICD-10-CM | POA: Diagnosis not present

## 2020-01-14 DIAGNOSIS — Z1212 Encounter for screening for malignant neoplasm of rectum: Secondary | ICD-10-CM | POA: Diagnosis not present

## 2020-01-20 DIAGNOSIS — Z20822 Contact with and (suspected) exposure to covid-19: Secondary | ICD-10-CM | POA: Diagnosis not present

## 2020-03-06 DIAGNOSIS — I252 Old myocardial infarction: Secondary | ICD-10-CM

## 2020-03-06 HISTORY — DX: Old myocardial infarction: I25.2

## 2020-04-28 DIAGNOSIS — E039 Hypothyroidism, unspecified: Secondary | ICD-10-CM | POA: Diagnosis not present

## 2020-04-28 DIAGNOSIS — R7301 Impaired fasting glucose: Secondary | ICD-10-CM | POA: Diagnosis not present

## 2020-04-28 DIAGNOSIS — E559 Vitamin D deficiency, unspecified: Secondary | ICD-10-CM | POA: Diagnosis not present

## 2020-04-28 DIAGNOSIS — E785 Hyperlipidemia, unspecified: Secondary | ICD-10-CM | POA: Diagnosis not present

## 2020-08-22 IMAGING — RF DG LUMBAR SPINE 2-3V
1 series · 3 of 3 positions shown · non-contrast
Comparison: 10/31/2018

CLINICAL DATA: Lumbar fusion

EXAM:
LUMBAR SPINE - 2-3 VIEW; DG C-ARM 1-60 MIN

[Series 1: run · 3 of 3 slices shown]
[im 1/3]
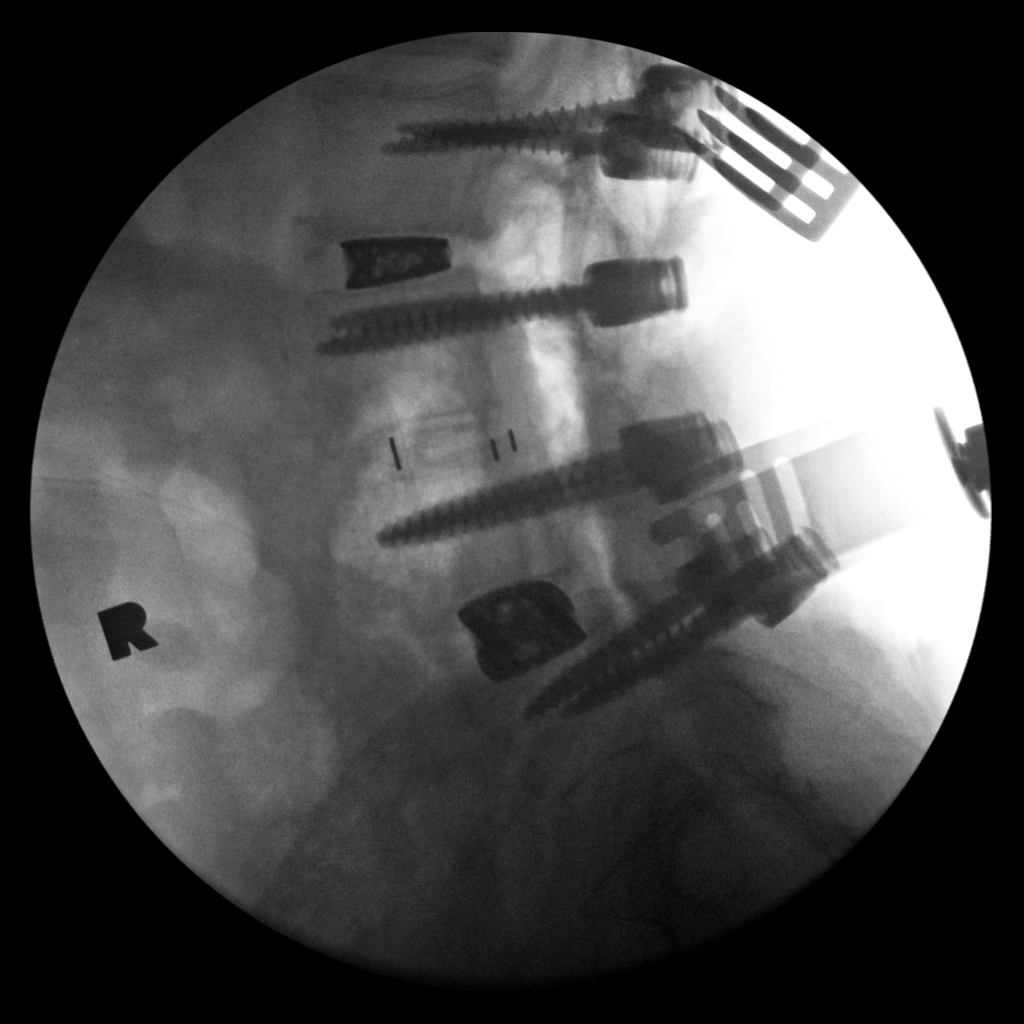
[im 2/3]
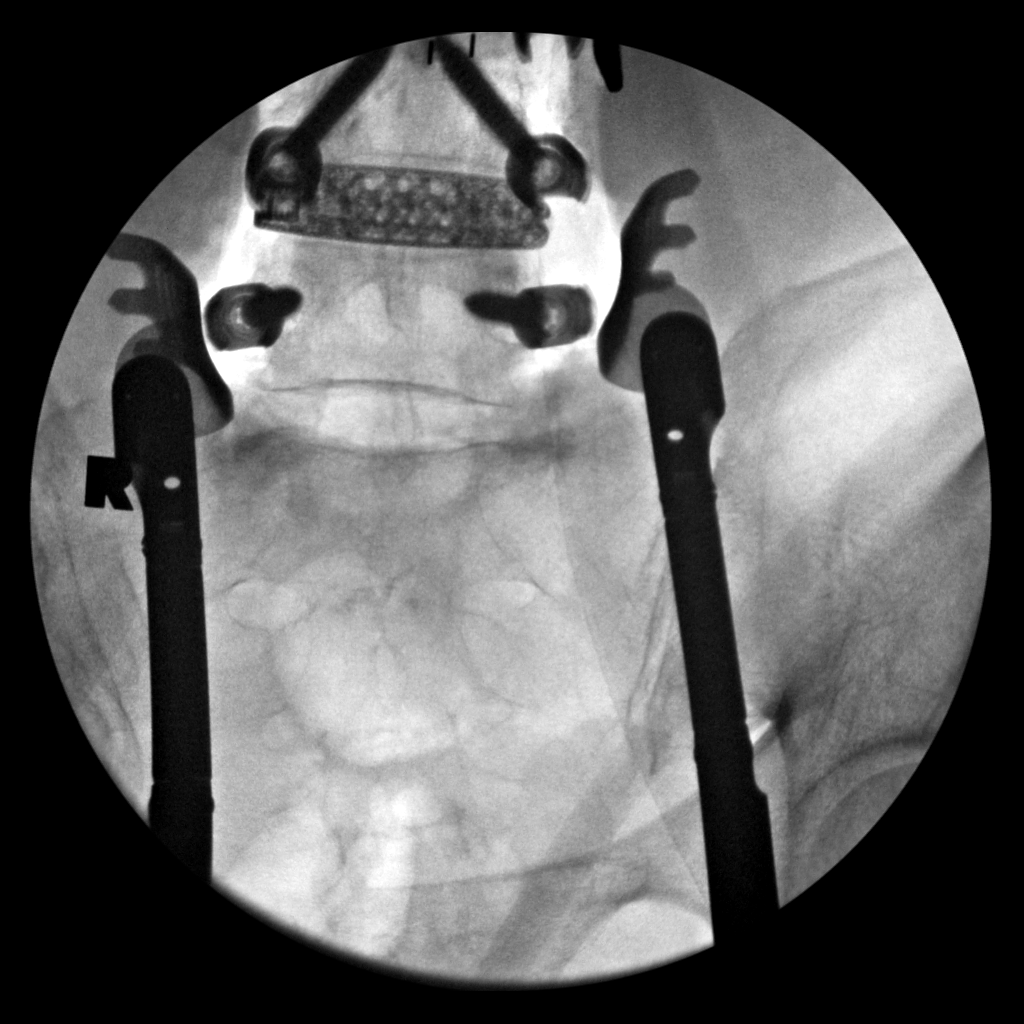
[im 3/3]
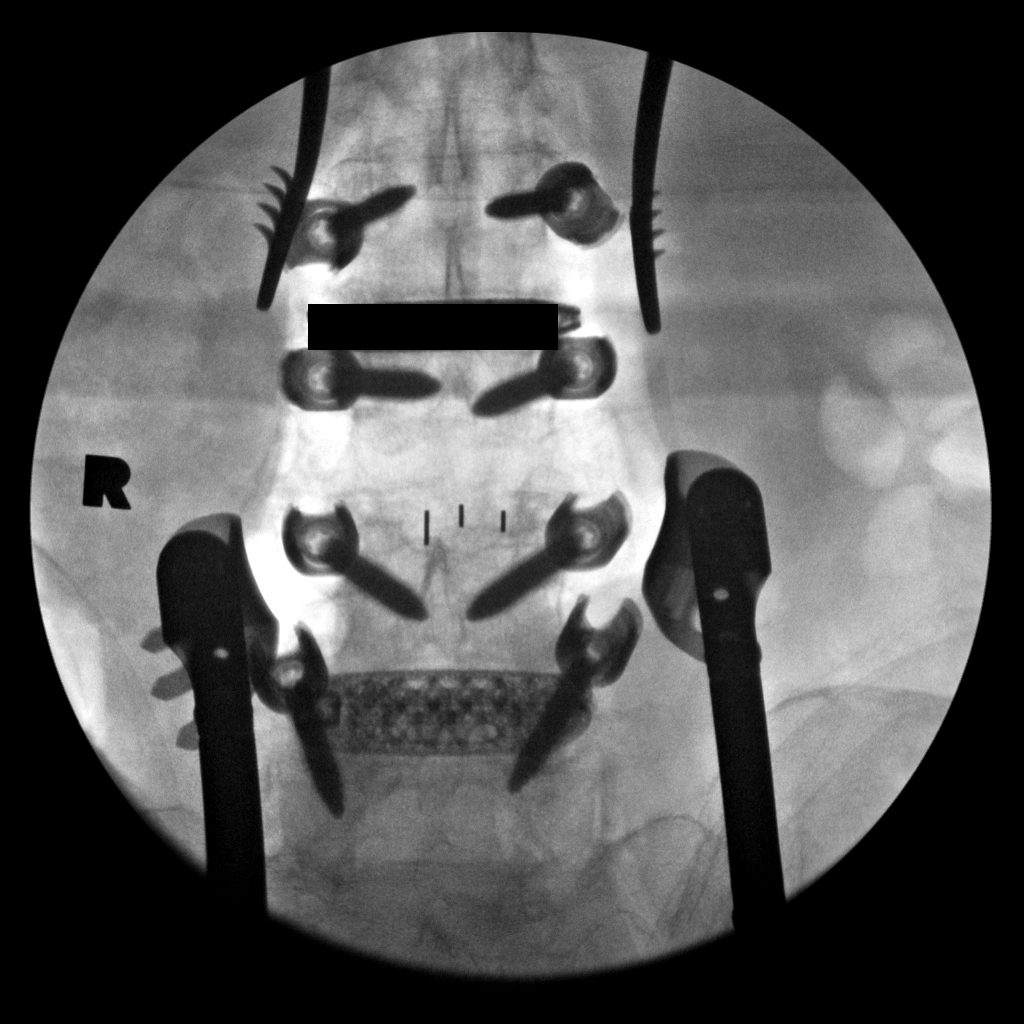

[3 of 3 positions shown; findings below may reference images not displayed]

FINDINGS: 3 C-arm fluoroscopic images were obtained intraoperatively and
submitted for post operative interpretation. Interval extension of
fusion hardware within the lumbar spine now spanning L2-L5. 3
minutes and 14 seconds of fluoroscopy time was utilized. Please see
the performing provider's procedural report for further detail.
IMPRESSION: As above.

## 2020-08-22 IMAGING — RF DG C-ARM 1-60 MIN
1 series · 3 of 3 positions shown · non-contrast
Comparison: 10/31/2018

CLINICAL DATA: Lumbar fusion

EXAM:
LUMBAR SPINE - 2-3 VIEW; DG C-ARM 1-60 MIN

[Series 1: run · 3 of 3 slices shown]
[im 1/3]
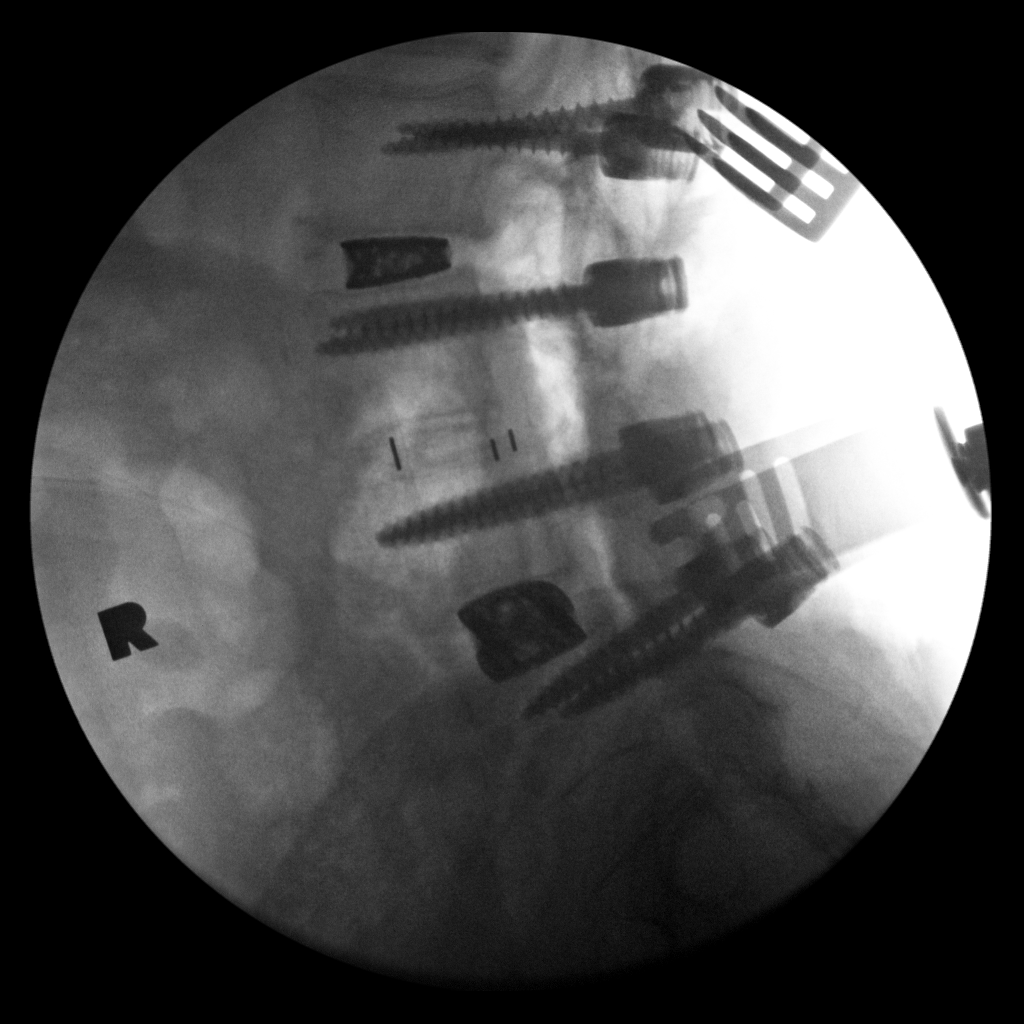
[im 2/3]
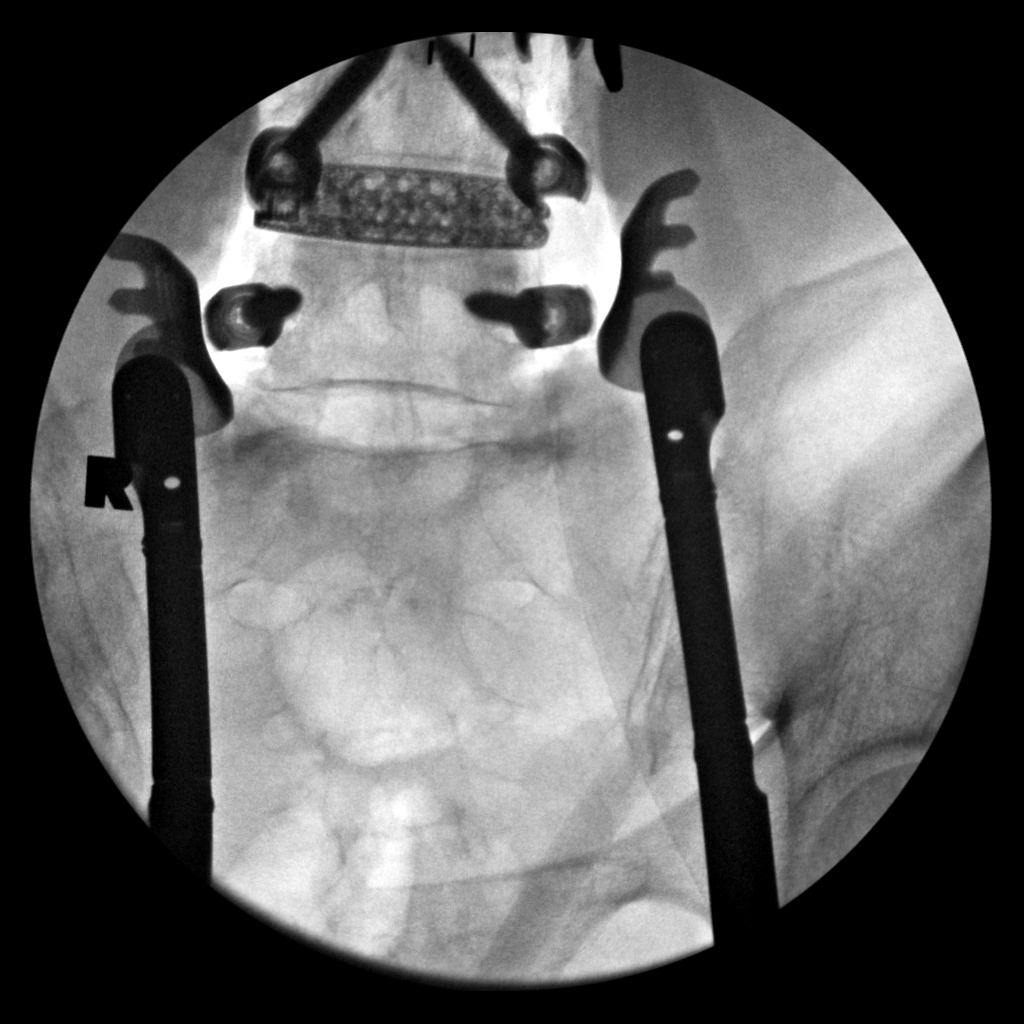
[im 3/3]
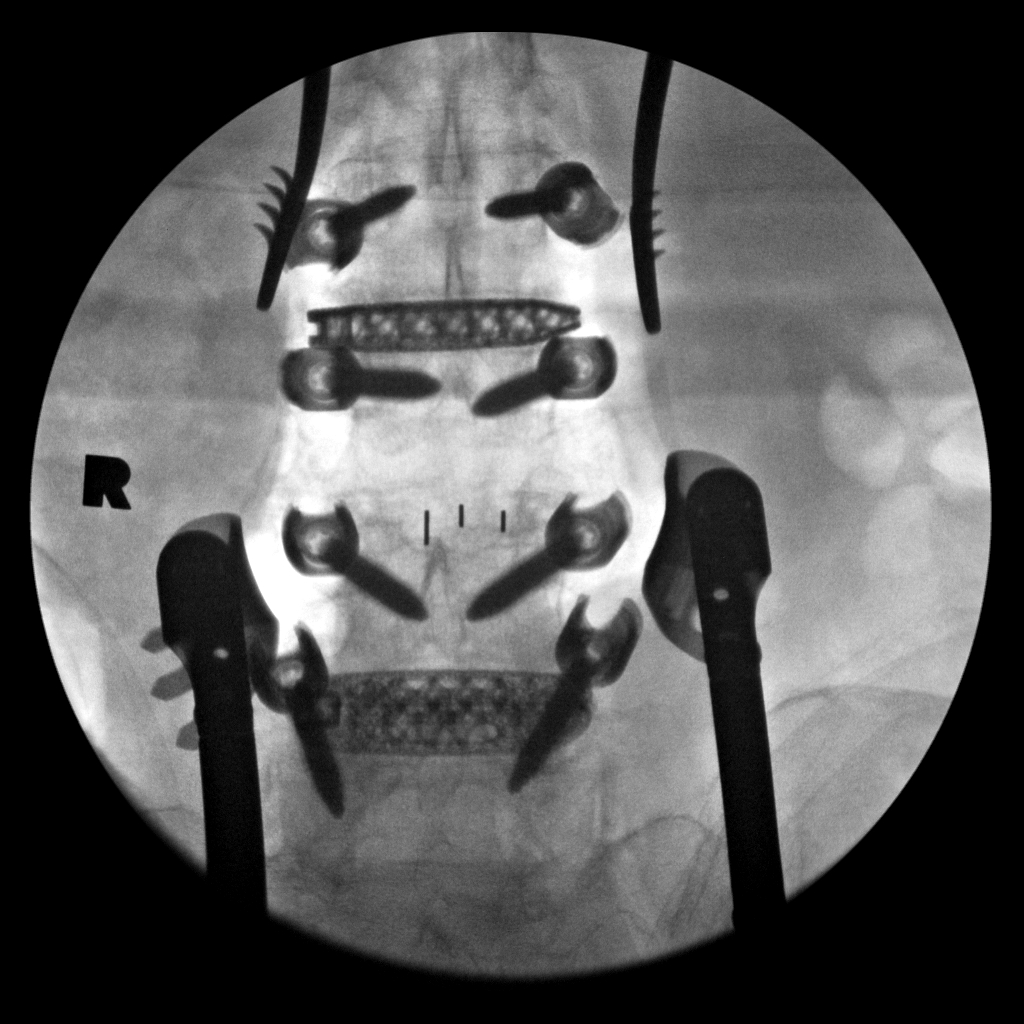

[3 of 3 positions shown; findings below may reference images not displayed]

FINDINGS: 3 C-arm fluoroscopic images were obtained intraoperatively and
submitted for post operative interpretation. Interval extension of
fusion hardware within the lumbar spine now spanning L2-L5. 3
minutes and 14 seconds of fluoroscopy time was utilized. Please see
the performing provider's procedural report for further detail.
IMPRESSION: As above.

## 2020-09-08 DIAGNOSIS — Z87442 Personal history of urinary calculi: Secondary | ICD-10-CM | POA: Diagnosis not present

## 2020-09-08 DIAGNOSIS — R319 Hematuria, unspecified: Secondary | ICD-10-CM | POA: Diagnosis not present

## 2020-09-08 DIAGNOSIS — R300 Dysuria: Secondary | ICD-10-CM | POA: Diagnosis not present

## 2020-09-22 DIAGNOSIS — K449 Diaphragmatic hernia without obstruction or gangrene: Secondary | ICD-10-CM | POA: Diagnosis not present

## 2020-09-22 DIAGNOSIS — N200 Calculus of kidney: Secondary | ICD-10-CM | POA: Diagnosis not present

## 2020-09-22 DIAGNOSIS — K5730 Diverticulosis of large intestine without perforation or abscess without bleeding: Secondary | ICD-10-CM | POA: Diagnosis not present

## 2020-09-22 DIAGNOSIS — N202 Calculus of kidney with calculus of ureter: Secondary | ICD-10-CM | POA: Diagnosis not present

## 2020-09-22 DIAGNOSIS — R310 Gross hematuria: Secondary | ICD-10-CM | POA: Diagnosis not present

## 2020-09-23 DIAGNOSIS — N200 Calculus of kidney: Secondary | ICD-10-CM | POA: Diagnosis not present

## 2020-09-23 DIAGNOSIS — R310 Gross hematuria: Secondary | ICD-10-CM | POA: Diagnosis not present

## 2020-09-24 ENCOUNTER — Encounter (HOSPITAL_BASED_OUTPATIENT_CLINIC_OR_DEPARTMENT_OTHER): Payer: Self-pay | Admitting: Urology

## 2020-09-24 ENCOUNTER — Other Ambulatory Visit: Payer: Self-pay | Admitting: Urology

## 2020-09-24 ENCOUNTER — Other Ambulatory Visit: Payer: Self-pay
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
## 2020-09-30 ENCOUNTER — Encounter (HOSPITAL_BASED_OUTPATIENT_CLINIC_OR_DEPARTMENT_OTHER)
Admission: RE | Disposition: A | Discharge status: Home / Self Care | Payer: Self-pay | Source: Home / Self Care | Attending: Urology

## 2020-09-30 ENCOUNTER — Ambulatory Visit (HOSPITAL_BASED_OUTPATIENT_CLINIC_OR_DEPARTMENT_OTHER): Payer: Medicare Other | Admitting: Anesthesiology

## 2020-09-30 ENCOUNTER — Ambulatory Visit (HOSPITAL_BASED_OUTPATIENT_CLINIC_OR_DEPARTMENT_OTHER)
Admission: RE | Admit: 2020-09-30 | Discharge: 2020-09-30 | Disposition: A | Discharge status: Home / Self Care | Payer: Medicare Other | Attending: Urology | Admitting: Urology

## 2020-09-30 ENCOUNTER — Other Ambulatory Visit: Payer: Self-pay

## 2020-09-30 ENCOUNTER — Encounter (HOSPITAL_BASED_OUTPATIENT_CLINIC_OR_DEPARTMENT_OTHER): Payer: Self-pay | Admitting: Urology

## 2020-09-30 DIAGNOSIS — E039 Hypothyroidism, unspecified: Secondary | ICD-10-CM | POA: Diagnosis not present

## 2020-09-30 DIAGNOSIS — I214 Non-ST elevation (NSTEMI) myocardial infarction: Secondary | ICD-10-CM | POA: Diagnosis not present

## 2020-09-30 DIAGNOSIS — N200 Calculus of kidney: Secondary | ICD-10-CM | POA: Insufficient documentation

## 2020-09-30 DIAGNOSIS — Z79890 Hormone replacement therapy (postmenopausal): Secondary | ICD-10-CM | POA: Insufficient documentation

## 2020-09-30 DIAGNOSIS — Z79899 Other long term (current) drug therapy: Secondary | ICD-10-CM | POA: Diagnosis not present

## 2020-09-30 DIAGNOSIS — Z853 Personal history of malignant neoplasm of breast: Secondary | ICD-10-CM | POA: Diagnosis not present

## 2020-09-30 DIAGNOSIS — Z923 Personal history of irradiation: Secondary | ICD-10-CM | POA: Insufficient documentation

## 2020-09-30 DIAGNOSIS — Z9013 Acquired absence of bilateral breasts and nipples: Secondary | ICD-10-CM | POA: Diagnosis not present

## 2020-09-30 DIAGNOSIS — Z9221 Personal history of antineoplastic chemotherapy: Secondary | ICD-10-CM | POA: Insufficient documentation

## 2020-09-30 DIAGNOSIS — E785 Hyperlipidemia, unspecified: Secondary | ICD-10-CM | POA: Diagnosis not present

## 2020-09-30 HISTORY — PX: CYSTOSCOPY/URETEROSCOPY/HOLMIUM LASER/STENT PLACEMENT: SHX6546

## 2020-09-30 HISTORY — DX: Presence of spectacles and contact lenses: Z97.3

## 2020-09-30 HISTORY — DX: Calculus of kidney: N20.0

## 2020-09-30 HISTORY — DX: Presence of external hearing-aid: Z97.4

## 2020-09-30 HISTORY — DX: Personal history of other diseases of the respiratory system: Z87.09

## 2020-09-30 HISTORY — DX: Atherosclerotic heart disease of native coronary artery without angina pectoris: I25.10

## 2020-09-30 HISTORY — DX: Hyperlipidemia, unspecified: E78.5

## 2020-09-30 SURGERY — CYSTOSCOPY/URETEROSCOPY/HOLMIUM LASER/STENT PLACEMENT
Anesthesia: General | Site: Ureter | Laterality: Right

## 2020-09-30 MED ORDER — PROPOFOL 10 MG/ML IV BOLUS
INTRAVENOUS | Status: DC | PRN
  Administered 2020-09-30: 120 mg via INTRAVENOUS

## 2020-09-30 MED ORDER — ONDANSETRON HCL 4 MG/2ML IJ SOLN: 4.0000 mg | Freq: Once | INTRAMUSCULAR | Status: DC | PRN

## 2020-09-30 MED ORDER — FENTANYL CITRATE (PF) 100 MCG/2ML IJ SOLN
INTRAMUSCULAR | Status: DC | PRN
  Administered 2020-09-30: 50 ug via INTRAVENOUS

## 2020-09-30 MED ORDER — CEFAZOLIN SODIUM-DEXTROSE 2-4 GM/100ML-% IV SOLN
2.0000 g | INTRAVENOUS | Status: AC
  Administered 2020-09-30: 2 g via INTRAVENOUS

## 2020-09-30 MED ORDER — PROPOFOL 500 MG/50ML IV EMUL
INTRAVENOUS | Status: AC
  Filled 2020-09-30: qty 50

## 2020-09-30 MED ORDER — KETOROLAC TROMETHAMINE 30 MG/ML IJ SOLN
INTRAMUSCULAR | Status: DC | PRN
  Administered 2020-09-30: 15 mg via INTRAVENOUS

## 2020-09-30 MED ORDER — KETOROLAC TROMETHAMINE 10 MG PO TABS: 10.0000 mg | ORAL_TABLET | Freq: Four times a day (QID) | ORAL | 0 refills | Status: DC | PRN

## 2020-09-30 MED ORDER — FENTANYL CITRATE (PF) 100 MCG/2ML IJ SOLN
INTRAMUSCULAR | Status: AC
  Filled 2020-09-30: qty 2

## 2020-09-30 MED ORDER — CEFAZOLIN SODIUM-DEXTROSE 2-4 GM/100ML-% IV SOLN
INTRAVENOUS | Status: AC
  Filled 2020-09-30: qty 100

## 2020-09-30 MED ORDER — ONDANSETRON HCL 4 MG/2ML IJ SOLN
INTRAMUSCULAR | Status: DC | PRN
  Administered 2020-09-30: 4 mg via INTRAVENOUS

## 2020-09-30 MED ORDER — FENTANYL CITRATE (PF) 100 MCG/2ML IJ SOLN: 25.0000 ug | INTRAMUSCULAR | Status: DC | PRN

## 2020-09-30 MED ORDER — LACTATED RINGERS IV SOLN: INTRAVENOUS | Status: DC

## 2020-09-30 MED ORDER — PHENYLEPHRINE HCL (PRESSORS) 10 MG/ML IV SOLN
INTRAVENOUS | Status: DC | PRN
Start: 2020-09-30 — End: 2020-09-30
  Administered 2020-09-30 (×2): 80 ug via INTRAVENOUS

## 2020-09-30 MED ORDER — LIDOCAINE HCL (CARDIAC) PF 100 MG/5ML IV SOSY
PREFILLED_SYRINGE | INTRAVENOUS | Status: DC | PRN
  Administered 2020-09-30: 60 mg via INTRAVENOUS

## 2020-09-30 MED ORDER — BELLADONNA ALKALOIDS-OPIUM 16.2-60 MG RE SUPP
RECTAL | Status: AC
  Filled 2020-09-30: qty 1

## 2020-09-30 MED ORDER — ACETAMINOPHEN 500 MG PO TABS
1000.0000 mg | ORAL_TABLET | Freq: Once | ORAL | Status: AC
  Administered 2020-09-30: 1000 mg via ORAL

## 2020-09-30 MED ORDER — CIPROFLOXACIN HCL 500 MG PO TABS: 500.0000 mg | ORAL_TABLET | Freq: Once | ORAL | 0 refills | Status: AC

## 2020-09-30 MED ORDER — PHENAZOPYRIDINE HCL 200 MG PO TABS: 200.0000 mg | ORAL_TABLET | Freq: Three times a day (TID) | ORAL | 0 refills | Status: DC | PRN

## 2020-09-30 MED ORDER — ACETAMINOPHEN 500 MG PO TABS
ORAL_TABLET | ORAL | Status: AC
  Filled 2020-09-30: qty 2

## 2020-09-30 MED ORDER — DEXAMETHASONE SODIUM PHOSPHATE 4 MG/ML IJ SOLN
INTRAMUSCULAR | Status: DC | PRN
  Administered 2020-09-30: 8 mg via INTRAVENOUS

## 2020-09-30 MED ORDER — PROPOFOL 500 MG/50ML IV EMUL
INTRAVENOUS | Status: DC | PRN
  Administered 2020-09-30: 150 ug/kg/min via INTRAVENOUS

## 2020-09-30 MED ORDER — EPHEDRINE SULFATE 50 MG/ML IJ SOLN
INTRAMUSCULAR | Status: DC | PRN
  Administered 2020-09-30 (×2): 5 mg via INTRAVENOUS

## 2020-09-30 MED ORDER — TRAMADOL HCL 50 MG PO TABS: 50.0000 mg | ORAL_TABLET | Freq: Four times a day (QID) | ORAL | 0 refills | Status: DC | PRN

## 2020-09-30 SURGICAL SUPPLY — 24 items
BAG DRAIN URO-CYSTO SKYTR STRL (DRAIN) ×2 IMPLANT
BAG DRN UROCATH (DRAIN) ×1
BASKET LASER NITINOL 1.9FR (BASKET) IMPLANT
BASKET STONE 1.7 NGAGE (UROLOGICAL SUPPLIES) ×1 IMPLANT
BSKT STON RTRVL 120 1.9FR (BASKET)
CATH URET 5FR 28IN OPEN ENDED (CATHETERS) ×2 IMPLANT
CATH URET DUAL LUMEN 6-10FR 50 (CATHETERS) ×1 IMPLANT
CLOTH BEACON ORANGE TIMEOUT ST (SAFETY) ×2 IMPLANT
COVER DOME SNAP 22 D (MISCELLANEOUS) ×1 IMPLANT
EXTRACTOR STONE 1.7FRX115CM (UROLOGICAL SUPPLIES) IMPLANT
GLOVE SURG ENC MOIS LTX SZ7.5 (GLOVE) ×2 IMPLANT
GOWN STRL REUS W/TWL XL LVL3 (GOWN DISPOSABLE) ×2 IMPLANT
GUIDEWIRE STR DUAL SENSOR (WIRE) ×3 IMPLANT
IV NS IRRIG 3000ML ARTHROMATIC (IV SOLUTION) ×4 IMPLANT
KIT TURNOVER CYSTO (KITS) ×2 IMPLANT
MANIFOLD NEPTUNE II (INSTRUMENTS) ×2 IMPLANT
NS IRRIG 500ML POUR BTL (IV SOLUTION) ×3 IMPLANT
PACK CYSTO (CUSTOM PROCEDURE TRAY) ×2 IMPLANT
SHEATH URETERAL 12FRX28CM (UROLOGICAL SUPPLIES) ×1 IMPLANT
STENT URET 6FRX24 CONTOUR (STENTS) ×1 IMPLANT
TRACTIP FLEXIVA PULS ID 200XHI (Laser) IMPLANT
TRACTIP FLEXIVA PULSE ID 200 (Laser) ×2
TUBE CONNECTING 12X1/4 (SUCTIONS) ×1 IMPLANT
TUBING UROLOGY SET (TUBING) ×2 IMPLANT

## 2020-09-30 NOTE — Interval H&P Note (Signed)
History and Physical Interval Note:  09/30/2020 2:13 PM  Crystal Lloyd  has presented today for surgery, with the diagnosis of RIGHT RENAL PELVIC STONE.  The various methods of treatment have been discussed with the patient and family. After consideration of risks, benefits and other options for treatment, the patient has consented to  Procedure(s): CYSTOSCOPY RIGHT RETROGRADE PYELOGRAM URETEROSCOPY/HOLMIUM LASER/STENT PLACEMENT (Right) as a surgical intervention.  The patient's history has been reviewed, patient examined, no change in status, stable for surgery.  I have reviewed the patient's chart and labs.  Questions were answered to the patient's satisfaction.     Ardis Hughs

## 2020-09-30 NOTE — Discharge Instructions (Addendum)
DISCHARGE INSTRUCTIONS FOR KIDNEY STONE/URETERAL STENT   MEDICATIONS:  1. Resume all your other meds from home - except do not take any extra narcotic pain meds that you may have at home.  2. Pyridium is to help with the burning/stinging when you urinate. 3. Tramadol is for moderate/severe pain, otherwise taking upto 1000 mg every 6 hours of plainTylenol will help treat your pain.   4. Take Cipro one hour prior to removal of your stent.  5. Toradol is for minor pain.  ACTIVITY:  1. No strenuous activity x 1week  2. No driving while on narcotic pain medications  3. Drink plenty of water  4. Continue to walk at home - you can still get blood clots when you are at home, so keep active, but don't over do it.  5. May return to work/school tomorrow or when you feel ready   BATHING:  1. You can shower and we recommend daily showers  2. You have a string coming from your urethra: The stent string is attached to your ureteral stent. Do not pull on this.   SIGNS/SYMPTOMS TO CALL:  Please call us if you have a fever greater than 101.5, uncontrolled nausea/vomiting, uncontrolled pain, dizziness, unable to urinate, bloody urine, chest pain, shortness of breath, leg swelling, leg pain, redness around wound, drainage from wound, or any other concerns or questions.   You can reach [place] at [phone].   FOLLOW-UP:  1. You have an appointment in 1 week. 2. You have a string attached to your stent, you may remove it on Monday Sept, 12. To do this, pull the strings until the stents are completely removed. You may feel an odd sensation in your back.  Post Anesthesia Home Care Instructions  Activity: Get plenty of rest for the remainder of the day. A responsible individual must stay with you for 24 hours following the procedure.  For the next 24 hours, DO NOT: -Drive a car -Paediatric nurse -Drink alcoholic beverages -Take any medication unless instructed by your physician -Make any legal decisions  or sign important papers.  Meals: Start with liquid foods such as gelatin or soup. Progress to regular foods as tolerated. Avoid greasy, spicy, heavy foods. If nausea and/or vomiting occur, drink only clear liquids until the nausea and/or vomiting subsides. Call your physician if vomiting continues.  Special Instructions/Symptoms: Your throat may feel dry or sore from the anesthesia or the breathing tube placed in your throat during surgery. If this causes discomfort, gargle with warm salt water. The discomfort should disappear within 24 hours.  No acetaminophen/Tylenol until after 7:00 pm today if needed.

## 2020-09-30 NOTE — Anesthesia Procedure Notes (Signed)
Procedure Name: LMA Insertion Date/Time: 09/30/2020 2:33 PM Performed by: Georgeanne Nim, CRNA Pre-anesthesia Checklist: Patient identified, Emergency Drugs available, Suction available, Patient being monitored and Timeout performed Patient Re-evaluated:Patient Re-evaluated prior to induction Oxygen Delivery Method: Circle system utilized Preoxygenation: Pre-oxygenation with 100% oxygen Induction Type: IV induction Ventilation: Mask ventilation without difficulty LMA: LMA inserted LMA Size: 4.0 Number of attempts: 1 Placement Confirmation: positive ETCO2, CO2 detector and breath sounds checked- equal and bilateral Tube secured with: Tape Dental Injury: Teeth and Oropharynx as per pre-operative assessment

## 2020-09-30 NOTE — H&P (Signed)
I have kidney stones.  HPI: Crystal Lloyd is a 75 year-old female established patient who is here for renal calculi.  The problem is on the left side. She first stated noticing pain on approximately 11/24/2016.   09/23/20: Crystal Lloyd returns today in f/u with a CT stone study for her recent gross hematuria with some mild right flank discomfort. The CT shows a 10x59m stone in the right renal pelvis without obstruction and smaller RLP stones. There are a few small LLP stones as well without obstruction. The UA today has microhematuria.   09/22/20: Dr. BIsaiah Lloyd a very pleasant 75year old female who was last seen in our clinic 3 years ago for left sided nephrolithiasis. She presents today with a three week onset of painless gross hematuria, which has been constant and persistent. 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 She is a non-smoker, no hx of radiation to the pelvis. There is no complaints of pain, nausea, or dysuria. She denies voiding changes. She is currently taking flomax, which she began three weeks at the onset of her symptoms. She denies passage of stone material.     Crystal Lloyd who was seen in the ER on 11/8 for left flank pain. A CT showed a 327mleft midureteral stone and 43m90milateral lower pole stones. The stones have a density <400HU and are not visible on the scout views. She had a CT in 2012 and there were no stones seen. She had some intermittent crampy pain after the ER visit and she passed the stone 3.5 weeks later. She had back surgery last year and had a complication and was less active for a 3 month period. She had some hematuria prior to the ER visit. She had no prior history of stones. She took a month of antibiotics in 5/18 for sinusitis.       ALLERGIES: None   MEDICATIONS: Repatha Pushtronex  Synthroid  Vitamin B12  Vitamin D     GU PSH: Locm 300-'399Mg'$ /Ml Iodine,1Ml - 09/22/2020     NON-GU PSH: Appendectomy Back  surgery, spine fusion Breast mastectomy, Bilateral Partial colectomy Tonsillectomy     GU PMH: Gross hematuria - 09/22/2020 Renal and ureteral calculus - 09/22/2020, She passed her ureteral stone and has two small renal stones. I will analyze her stone and discussed some possible causes including her recent decreased activity with the back surgery and a course of antibiotic therapy in 5/18. I reviewed diet and hydration recommendations and will have her return in 6 months with a KUB. , - 2019 Renal calculus    NON-GU PMH: Asthma Breast Cancer, History Diverticulitis Hypercholesterolemia Hypothyroidism    FAMILY HISTORY: 2 sons - Other Congestive Heart Failure - Father Hypertension - Mother   SOCIAL HISTORY: Marital Status: Married Preferred Language: English; Race: White Current Smoking Status: Patient has never smoked.   Tobacco Use Assessment Completed: Used Tobacco in last 30 days? Drinks 1 caffeinated drink per day.    REVIEW OF SYSTEMS:    GU Review Female:   Patient denies frequent urination, hard to postpone urination, burning /pain with urination, get up at night to urinate, leakage of urine, stream starts and stops, trouble starting your stream, have to strain to urinate, and being pregnant.  Gastrointestinal (Upper):   Patient denies nausea, vomiting, and indigestion/ heartburn.  Gastrointestinal (Lower):   Patient denies diarrhea and constipation.  Constitutional:   Patient denies fever, night sweats, weight loss, and fatigue.  Skin:   Patient denies skin rash/ lesion and itching.  Eyes:   Patient denies blurred vision and double vision.  Ears/ Nose/ Throat:   Patient denies sore throat and sinus problems.  Hematologic/Lymphatic:   Patient denies swollen glands and easy bruising.  Cardiovascular:   Patient denies leg swelling and chest pains.  Respiratory:   Patient denies cough and shortness of breath.  Endocrine:   Patient denies excessive thirst.   Musculoskeletal:   Patient denies back pain and joint pain.  Neurological:   Patient denies headaches and dizziness.  Psychologic:   Patient denies depression and anxiety.   Notes: Patient reports blood in urine, flank pain    VITAL SIGNS: None   MULTI-SYSTEM PHYSICAL EXAMINATION:    Constitutional: Well-nourished. No physical deformities. Normally developed. Good grooming.  Respiratory: No labored breathing, no use of accessory muscles.      Complexity of Data:  Records Review:   Previous Doctor Records, Previous Patient Records  Urine Test Review:   Urinalysis  Urodynamics Review:   Review Bladder Scan  X-Ray Review: C.T. Abdomen/Pelvis: Reviewed Films. Reviewed Report. Discussed With Patient.     PROCEDURES:          Urinalysis w/Scope Dipstick Dipstick Cont'd Micro  Color: Yellow Bilirubin: Neg mg/dL WBC/hpf: 0 - 5/hpf  Appearance: Cloudy Ketones: Neg mg/dL RBC/hpf: 40 - 60/hpf  Specific Gravity: 1.025 Blood: 3+ ery/uL Bacteria: Few (10-25/hpf)  pH: 5.5 Protein: 1+ mg/dL Cystals: NS (Not Seen)  Glucose: Neg mg/dL Urobilinogen: 0.2 mg/dL Casts: NS (Not Seen)    Nitrites: Neg Trichomonas: Not Present    Leukocyte Esterase: Trace leu/uL Mucous: Present      Epithelial Cells: 0 - 5/hpf      Yeast: NS (Not Seen)      Sperm: Not Present    Notes: Unspun micro due to clarity    ASSESSMENT:      ICD-10 Details  1 GU:   Renal calculus - 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 I discussed options for therapy including ESWL, URS and PCNL. [place] and is reluctant to go without being treated even though she is at low risk for subsequent obstruction from this stone. She is leaning toward ureteroscopy which I would like to get done next week if possible but I am out of town. I will try to get it scheduled with one of my partners. I have reviewed the risks of  ureteroscopy including bleeding, infection, ureteral injury, need for a stent or secondary procedures, thrombotic events and anesthetic complications.   2   Gross hematuria - R31.0 Chronic, Stable   PLAN:           Orders Labs Urine Culture          Schedule Return Visit/Planned Activity: ASAP - Schedule Surgery  Return Visit/Planned Activity: Next Available Appointment - PT Referral  Procedure: Unspecified Date - Cysto Uretero Lithotripsy -EJ:[id number]: Next week if possible.

## 2020-09-30 NOTE — Anesthesia Preprocedure Evaluation (Addendum)
Anesthesia Evaluation  Patient identified by MRN, date of birth, ID band Patient awake    Reviewed: Allergy & Precautions, NPO status , Patient's Chart, lab work & pertinent test results  History of Anesthesia Complications (+) PONV and history of anesthetic complications  Airway Mallampati: II  TM Distance: >3 FB Neck ROM: Full    Dental  (+) Teeth Intact, Dental Advisory Given, Implants, Caps   Pulmonary asthma ,    Pulmonary exam normal breath sounds clear to auscultation       Cardiovascular + CAD and + Past MI  Normal cardiovascular exam Rhythm:Regular Rate:Normal     Neuro/Psych negative neurological ROS     GI/Hepatic negative GI ROS, Neg liver ROS,   Endo/Other  Hypothyroidism   Renal/GU Renal disease (RIGHT RENAL PELVIC STONE)     Musculoskeletal negative musculoskeletal ROS (+)   Abdominal   Peds  Hematology negative hematology ROS (+)   Anesthesia Other Findings Day of surgery medications reviewed with the patient.  Breast cancer s/p  bilateral mastectomies, chemo and radiation therapy  Reproductive/Obstetrics                            Anesthesia Physical Anesthesia Plan  ASA: 3  Anesthesia Plan: General   Post-op Pain Management:    Induction: Intravenous  PONV Risk Score and Plan: 4 or greater and Midazolam, TIVA, Dexamethasone and Ondansetron  Airway Management Planned: LMA  Additional Equipment:   Intra-op Plan:   Post-operative Plan: Extubation in OR  Informed Consent: I have reviewed the patients History and Physical, chart, labs and discussed the procedure including the risks, benefits and alternatives for the proposed anesthesia with the patient or authorized representative who has indicated his/her understanding and acceptance.     Dental advisory given  Plan Discussed with: CRNA  Anesthesia Plan Comments:         Anesthesia Quick  Evaluation

## 2020-09-30 NOTE — Transfer of Care (Signed)
Immediate Anesthesia Transfer of Care Note  Patient: Crystal Lloyd  Procedure(s) Performed: CYSTOSCOPY RIGHT RETROGRADE PYELOGRAM URETEROSCOPY/HOLMIUM LASER/STENT PLACEMENT (Right: Ureter)  Patient Location: PACU  Anesthesia Type:General  Level of Consciousness: awake, alert , oriented and patient cooperative  Airway & Oxygen Therapy: Patient Spontanous Breathing and Patient connected to nasal cannula oxygen  Post-op Assessment: Report given to RN and Post -op Vital signs reviewed and stable  Post vital signs: Reviewed and stable  Last Vitals:  Vitals Value Taken Time  BP 140/73 09/30/20 1600  Temp    Pulse 58 09/30/20 1604  Resp 15 09/30/20 1604  SpO2 96 % 09/30/20 1604  Vitals shown include unvalidated device data.  Last Pain:  Vitals:   09/30/20 1259  TempSrc: Oral  PainSc: 0-No pain      Patients Stated Pain Goal: 3 ([id number])  Complications: No notable events documented.

## 2020-09-30 NOTE — Op Note (Signed)
Preoperative diagnosis: right renal calculus  Postoperative diagnosis: same  Procedure:  Cystoscopy right ureteroscopy and stone removal Ureteroscopic laser lithotripsy right 45F x 24cm ureteral stent placement  right retrograde pyelography with interpretation  Surgeon: Ardis Hughs, MD  Anesthesia: General  Complications: None  Intraoperative findings: right retrograde pyelography demonstrated a filling defect within the right UPJ consistent with the patient's known calculus without other abnormalities.  EBL: Minimal  Specimens: right ureteral calculus   Indication: Crystal Lloyd is a 75 y.o.   patient with a right UPJ stone and found during evaluation for gross hematuria. After reviewing the management options for treatment, the patient elected to proceed with the above surgical procedure(s). We have discussed the potential benefits and risks of the procedure, side effects of the proposed treatment, the likelihood of the patient achieving the goals of the procedure, and any potential problems that might occur during the procedure or recuperation. Informed consent has been obtained.   Description of procedure:  The patient was taken to the operating room and general anesthesia was induced.  The patient was placed in the dorsal lithotomy position, prepped and draped in the usual sterile fashion, and preoperative antibiotics were administered. A preoperative time-out was performed.   Cystourethroscopy was performed.  The patient's urethra was examined and was normal. The bladder was then systematically examined in its entirety. There was no evidence for any bladder tumors, stones, or other mucosal pathology.    Attention then turned to the right ureteral orifice and a ureteral catheter was used to intubate the ureteral orifice.  Omnipaque contrast was injected through the ureteral catheter and a retrograde pyelogram was performed with findings as dictated above.  A  0.38 sensor guidewire was then advanced up the right ureter into the renal pelvis under fluoroscopic guidance. The 6 Fr semirigid ureteroscope was then advanced into the ureter next to the guidewire and the calculus was identified.  I was unable to engage the stone in the ureter, because it had been pushed back into the lower pole of the kidney.  As such a second wire was passed through the scope prior to removing it and a 12/82F small ureteral access sheath was passed into the proximal ureter under fluoroscopy without difficulty.  The inner portion and the second wire were removed.    Using the flexible ureteroscope I was able to basket the stone and move it into the upper pole where I fragmented it using a 200 micron holmium laser fiber on a setting of 1.0 and frequency of 10 Hz.   All stones were then removed from the collecting system with an N-gage nitinol basket.  Remaining were very small pieces of stone that were too small to basket.  Reinspection of the ureter revealed no remaining visible stones or fragments.   I then shot a completion retrograde pyelogram noting no extravasation or additional filling defects.  The wire was then backloaded through the cystoscope and a ureteral stent was advance over the wire using Seldinger technique.  The stent was positioned appropriately under fluoroscopic and cystoscopic guidance.  The wire was then removed with an adequate stent curl noted in the renal pelvis as well as in the bladder.  The bladder was then emptied and the procedure ended.  The patient appeared to tolerate the procedure well and without complications.  The patient was able to be awakened and transferred to the recovery unit in satisfactory condition.   Disposition: The tether of the stent was left on and  tucked inside the patient's vagina.  Instructions for removing the stent have been provided to the patient.

## 2020-10-01 ENCOUNTER — Encounter (HOSPITAL_BASED_OUTPATIENT_CLINIC_OR_DEPARTMENT_OTHER): Payer: Self-pay | Admitting: Urology

## 2020-10-01 NOTE — Anesthesia Postprocedure Evaluation (Signed)
Anesthesia Post Note  Patient: Crystal Lloyd  Procedure(s) Performed: CYSTOSCOPY RIGHT RETROGRADE PYELOGRAM URETEROSCOPY/HOLMIUM LASER/STENT PLACEMENT (Right: Ureter)     Patient location during evaluation: PACU Anesthesia Type: General Level of consciousness: awake and alert Pain management: pain level controlled Vital Signs Assessment: post-procedure vital signs reviewed and stable Respiratory status: spontaneous breathing, nonlabored ventilation and respiratory function stable Cardiovascular status: stable and blood pressure returned to baseline Anesthetic complications: no   No notable events documented.  Last Vitals:  Vitals:   09/30/20 1615 09/30/20 1636  BP: 105/83 (!) 137/93  Pulse: (!) 51 (!) 53  Resp: 16 12  Temp:    SpO2: 99% 100%                   Audry Pili

## 2020-10-02 ENCOUNTER — Ambulatory Visit (HOSPITAL_BASED_OUTPATIENT_CLINIC_OR_DEPARTMENT_OTHER): Payer: Medicare Other | Admitting: Obstetrics & Gynecology

## 2020-10-06 ENCOUNTER — Ambulatory Visit (HOSPITAL_BASED_OUTPATIENT_CLINIC_OR_DEPARTMENT_OTHER): Payer: Medicare Other | Admitting: Obstetrics & Gynecology

## 2020-10-07 ENCOUNTER — Ambulatory Visit (HOSPITAL_BASED_OUTPATIENT_CLINIC_OR_DEPARTMENT_OTHER): Payer: Medicare Other | Admitting: Obstetrics & Gynecology

## 2020-10-07 DIAGNOSIS — R8271 Bacteriuria: Secondary | ICD-10-CM | POA: Diagnosis not present

## 2020-10-07 DIAGNOSIS — N202 Calculus of kidney with calculus of ureter: Secondary | ICD-10-CM | POA: Diagnosis not present

## 2020-10-07 DIAGNOSIS — N3000 Acute cystitis without hematuria: Secondary | ICD-10-CM | POA: Diagnosis not present

## 2020-10-07 LAB — CALCULI, WITH PHOTOGRAPH (CLINICAL LAB)
Calcium Oxalate Dihydrate: 70 %
Calcium Oxalate Monohydrate: 30 %
Weight Calculi: 15 mg

## 2020-10-08 ENCOUNTER — Ambulatory Visit (HOSPITAL_BASED_OUTPATIENT_CLINIC_OR_DEPARTMENT_OTHER): Payer: Medicare Other | Admitting: Obstetrics & Gynecology

## 2020-11-09 ENCOUNTER — Ambulatory Visit (HOSPITAL_BASED_OUTPATIENT_CLINIC_OR_DEPARTMENT_OTHER): Payer: Medicare Other | Admitting: Obstetrics & Gynecology

## 2020-11-18 DIAGNOSIS — E559 Vitamin D deficiency, unspecified: Secondary | ICD-10-CM | POA: Diagnosis not present

## 2020-11-18 DIAGNOSIS — E039 Hypothyroidism, unspecified: Secondary | ICD-10-CM | POA: Diagnosis not present

## 2020-11-18 DIAGNOSIS — E785 Hyperlipidemia, unspecified: Secondary | ICD-10-CM | POA: Diagnosis not present

## 2020-11-18 DIAGNOSIS — R7301 Impaired fasting glucose: Secondary | ICD-10-CM | POA: Diagnosis not present

## 2020-11-19 DIAGNOSIS — E039 Hypothyroidism, unspecified: Secondary | ICD-10-CM | POA: Diagnosis not present

## 2020-11-19 DIAGNOSIS — I700 Atherosclerosis of aorta: Secondary | ICD-10-CM | POA: Diagnosis not present

## 2020-11-19 DIAGNOSIS — R7301 Impaired fasting glucose: Secondary | ICD-10-CM | POA: Diagnosis not present

## 2020-11-19 DIAGNOSIS — I5181 Takotsubo syndrome: Secondary | ICD-10-CM | POA: Diagnosis not present

## 2020-12-16 ENCOUNTER — Other Ambulatory Visit (HOSPITAL_COMMUNITY)
Admission: RE | Admit: 2020-12-16 | Discharge: 2020-12-16 | Disposition: A | Discharge status: Home / Self Care | Payer: Medicare Other | Source: Ambulatory Visit | Attending: Obstetrics & Gynecology | Admitting: Obstetrics & Gynecology

## 2020-12-16 ENCOUNTER — Ambulatory Visit (INDEPENDENT_AMBULATORY_CARE_PROVIDER_SITE_OTHER): Payer: Medicare Other | Admitting: Obstetrics & Gynecology

## 2020-12-16 ENCOUNTER — Encounter (HOSPITAL_BASED_OUTPATIENT_CLINIC_OR_DEPARTMENT_OTHER): Payer: Self-pay | Admitting: Obstetrics & Gynecology

## 2020-12-16 VITALS — BP 142/79 | HR 81 | Ht 60.0 in | Wt 118.2 lb

## 2020-12-16 DIAGNOSIS — Z01419 Encounter for gynecological examination (general) (routine) without abnormal findings: Secondary | ICD-10-CM

## 2020-12-16 DIAGNOSIS — Z124 Encounter for screening for malignant neoplasm of cervix: Secondary | ICD-10-CM | POA: Diagnosis not present

## 2020-12-16 DIAGNOSIS — R319 Hematuria, unspecified: Secondary | ICD-10-CM

## 2020-12-16 DIAGNOSIS — N200 Calculus of kidney: Secondary | ICD-10-CM

## 2020-12-16 DIAGNOSIS — N952 Postmenopausal atrophic vaginitis: Secondary | ICD-10-CM | POA: Insufficient documentation

## 2020-12-16 DIAGNOSIS — Z1151 Encounter for screening for human papillomavirus (HPV): Secondary | ICD-10-CM | POA: Insufficient documentation

## 2020-12-16 DIAGNOSIS — Z853 Personal history of malignant neoplasm of breast: Secondary | ICD-10-CM

## 2020-12-16 DIAGNOSIS — M8580 Other specified disorders of bone density and structure, unspecified site: Secondary | ICD-10-CM

## 2020-12-16 DIAGNOSIS — Z90722 Acquired absence of ovaries, bilateral: Secondary | ICD-10-CM

## 2020-12-16 DIAGNOSIS — R2689 Other abnormalities of gait and mobility: Secondary | ICD-10-CM | POA: Diagnosis not present

## 2020-12-16 DIAGNOSIS — G454 Transient global amnesia: Secondary | ICD-10-CM

## 2020-12-16 DIAGNOSIS — Z9079 Acquired absence of other genital organ(s): Secondary | ICD-10-CM

## 2020-12-16 DIAGNOSIS — D013 Carcinoma in situ of anus and anal canal: Secondary | ICD-10-CM | POA: Diagnosis not present

## 2020-12-16 LAB — POCT URINALYSIS DIPSTICK
Bilirubin, UA: NEGATIVE
Glucose, UA: NEGATIVE
Ketones, UA: NEGATIVE
Leukocytes, UA: NEGATIVE
Nitrite, UA: NEGATIVE
Protein, UA: NEGATIVE
Spec Grav, UA: 1.02 (ref 1.010–1.025)
Urobilinogen, UA: 0.2 E.U./dL
pH, UA: 6 (ref 5.0–8.0)
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
## 2020-12-21 ENCOUNTER — Encounter (HOSPITAL_BASED_OUTPATIENT_CLINIC_OR_DEPARTMENT_OTHER): Payer: Self-pay | Admitting: *Deleted

## 2020-12-22 LAB — CYTOLOGY - PAP
Comment: NEGATIVE
Diagnosis: NEGATIVE
High risk HPV: NEGATIVE

## 2020-12-30 DIAGNOSIS — N202 Calculus of kidney with calculus of ureter: Secondary | ICD-10-CM | POA: Diagnosis not present

## 2021-01-07 DIAGNOSIS — H524 Presbyopia: Secondary | ICD-10-CM | POA: Diagnosis not present

## 2021-01-29 DIAGNOSIS — M79672 Pain in left foot: Secondary | ICD-10-CM | POA: Diagnosis not present

## 2021-01-29 DIAGNOSIS — M21622 Bunionette of left foot: Secondary | ICD-10-CM | POA: Diagnosis not present

## 2021-01-29 DIAGNOSIS — M719 Bursopathy, unspecified: Secondary | ICD-10-CM | POA: Diagnosis not present

## 2021-02-03 DIAGNOSIS — M719 Bursopathy, unspecified: Secondary | ICD-10-CM | POA: Diagnosis not present

## 2021-02-03 DIAGNOSIS — L03116 Cellulitis of left lower limb: Secondary | ICD-10-CM | POA: Diagnosis not present

## 2021-02-10 DIAGNOSIS — M79672 Pain in left foot: Secondary | ICD-10-CM | POA: Diagnosis not present

## 2021-02-11 DIAGNOSIS — M79672 Pain in left foot: Secondary | ICD-10-CM | POA: Diagnosis not present

## 2021-02-15 DIAGNOSIS — M79672 Pain in left foot: Secondary | ICD-10-CM | POA: Diagnosis not present

## 2021-03-16 DIAGNOSIS — K449 Diaphragmatic hernia without obstruction or gangrene: Secondary | ICD-10-CM | POA: Diagnosis not present

## 2021-03-16 DIAGNOSIS — N200 Calculus of kidney: Secondary | ICD-10-CM | POA: Diagnosis not present

## 2021-03-16 DIAGNOSIS — R310 Gross hematuria: Secondary | ICD-10-CM | POA: Diagnosis not present

## 2021-03-16 DIAGNOSIS — K5730 Diverticulosis of large intestine without perforation or abscess without bleeding: Secondary | ICD-10-CM | POA: Diagnosis not present

## 2021-03-17 ENCOUNTER — Other Ambulatory Visit: Payer: Self-pay

## 2021-03-17 ENCOUNTER — Ambulatory Visit (INDEPENDENT_AMBULATORY_CARE_PROVIDER_SITE_OTHER): Payer: Self-pay | Admitting: Plastic Surgery

## 2021-03-17 ENCOUNTER — Other Ambulatory Visit: Payer: Self-pay | Admitting: Urology

## 2021-03-17 DIAGNOSIS — Z411 Encounter for cosmetic surgery: Secondary | ICD-10-CM

## 2021-03-17 NOTE — Progress Notes (Signed)
Patient presents to discuss neuromodulator treatment.  She had in the past but is uncertain the number of units.  She is been treated in the forehead, glabella and crows feet.  She is also interested in some treatment of her perioral area.  She is bothered by marionette lines and generalized fine lines around her face.  On exam she has static and dynamic lines in the forehead, glabella and crows feet.  She has a dimple centrally in her chin.  Marionette lines are mild but she does have fine lines in the perioral area.  We reviewed the risks and benefits of Botox treatment and she is interested in moving forward.  32 units of Botox were distributed in the forehead, glabella and crows feet.  I did 3 injection points for the crows feet and 1 line for the forehead trying to stay high to avoid brow descent.  She tolerated this fine.  Regarding the fine lines elsewhere in her face we did discuss halo laser resurfacing.  She is interested in doing this but would like to think about the timing.  All of her questions were answered we will plan to see her at her next visit.

## 2021-03-23 ENCOUNTER — Ambulatory Visit (HOSPITAL_BASED_OUTPATIENT_CLINIC_OR_DEPARTMENT_OTHER): Payer: Medicare Other | Admitting: Certified Registered"

## 2021-03-25 NOTE — Progress Notes (Signed)
Talked with patient. Hx and meds reviewed instructions given. Arrival time 0915, clear liquids until 0715 solids until 0515. Husband is the driver ?

## 2021-03-26 NOTE — Progress Notes (Signed)
Spoke with patient and gave her her new arrival time 0800.  She was instructed NPO after midnight and clear liquids up until 0600. ?

## 2021-03-28 NOTE — H&P (Signed)
I have blood in my urine. ? ?  ? Crystal Lloyd returns today with the onset of gross hematuria last Friday night. The bleeding has persisted. She has mild, non-descript pain in the lower abdomen and mildly in the left back intermittently. She has no nausea or fever. She has no increased frequency or urgency. The urine has 40-60 RBC's.  ? ?  ?ALLERGIES: Phenazocine ?  ? ?MEDICATIONS: Repatha Pushtronex  ?Synthroid  ?Vitamin D  ?  ? ?GU PSH: Locm 300-399Mg/Ml Iodine,1Ml - 09/22/2020 ?Ureteroscopic laser litho - 09/30/2020 ? ?  ? ?NON-GU PSH: Appendectomy ?Back surgery, spine fusion ?Breast mastectomy, Bilateral ?Knee Arthroscopy/surgery - 1988 ?Partial colectomy ?Tonsillectomy ? ?  ? ?GU PMH: Renal and ureteral calculus - 12/30/2020, - 10/07/2020, - 09/22/2020, She passed her ureteral stone and has two small renal stones. I will analyze her stone and discussed some possible causes including her recent decreased activity with the back surgery and a course of antibiotic therapy in 5/18. I reviewed diet and hydration recommendations and will have her return in 6 months with a KUB. , - 2019 ?Acute Cystitis/UTI - 10/07/2020 ?Gross hematuria - 09/23/2020, - 09/22/2020 ?Renal calculus, She has bilateral renal stones without obstruction but there is a 10 x60m stone in the right renal pelvis. I discussed options for therapy including ESWL, URS and PCNL. [place] is traveling to [place] and is reluctant to go without being treated even though she is at low risk for subsequent obstruction from this stone. She is leaning toward ureteroscopy which I would like to get done next week if possible but I am out of town. I will try to get it scheduled with one of my partners. I have reviewed the risks of ureteroscopy including bleeding, infection, ureteral injury, need for a stent or secondary procedures, thrombotic events and anesthetic complications. - 09/23/2020 ?  ? ?NON-GU PMH: Asthma ?Breast Cancer,  History ?Diverticulitis ?Hypercholesterolemia ?Hypothyroidism ?  ? ?FAMILY HISTORY: 2 sons - Other ?Congestive Heart Failure - Father ?Hypertension - Mother  ? ?SOCIAL HISTORY: Marital Status: Married ?Preferred Language: English; Race: White ?Current Smoking Status: Patient has never smoked.  ? ?Tobacco Use Assessment Completed: Used Tobacco in last 30 days? ?Drinks 1 caffeinated drink per day. ?  ? ?REVIEW OF SYSTEMS:    ?GU Review Female:   Patient denies frequent urination, hard to postpone urination, burning /pain with urination, get up at night to urinate, leakage of urine, stream starts and stops, trouble starting your stream, have to strain to urinate, and being pregnant.  ?Gastrointestinal (Upper):   Patient denies nausea, vomiting, and indigestion/ heartburn.  ?Gastrointestinal (Lower):   Patient denies diarrhea and constipation.  ?Constitutional:   Patient denies fever, night sweats, weight loss, and fatigue.  ?Skin:   Patient denies skin rash/ lesion and itching.  ?Eyes:   Patient denies blurred vision and double vision.  ?Ears/ Nose/ Throat:   Patient denies sore throat and sinus problems.  ?Hematologic/Lymphatic:   Patient denies swollen glands and easy bruising.  ?Cardiovascular:   Patient denies leg swelling and chest pains.  ?Respiratory:   Patient denies cough and shortness of breath.  ?Endocrine:   Patient denies excessive thirst.  ?Musculoskeletal:   Patient denies back pain and joint pain.  ?Neurological:   Patient denies headaches and dizziness.  ?Psychologic:   Patient denies depression and anxiety.  ? ?VITAL SIGNS:    ?  03/16/2021 12:23 PM  ?BP 123/79 mmHg  ?Heart Rate 76 /min  ?Temperature 97.8 F /  36.5 C  ? ?MULTI-SYSTEM PHYSICAL EXAMINATION:    ?Constitutional: Well-nourished. No physical deformities. Normally developed. Good grooming.  ?Respiratory: Normal breath sounds. No labored breathing, no use of accessory muscles.   ?Cardiovascular: Regular rate and rhythm. No murmur, no gallop.    ? ?  ?Complexity of Data:  ?Records Review:   Previous Patient Records  ?Urine Test Review:   Urinalysis  ?X-Ray Review: KUB: Reviewed Films.  ?C.T. Stone Protocol: Reviewed Films. Discussed With Patient.  ?  ? ?PROCEDURES:    ?     C.T. Urogram - [id number]  ?There is an 8x45m stone in the mid left kidney that appears to be wedged in an infundibular neck. There are additional small bilateral stones. Full report pending.   ?  ?  ?Patient confirmed No Neulasta OnPro Device.  ? ? ?     Urinalysis w/Scope ?Dipstick Dipstick Cont'd Micro  ?Color: Yellow Bilirubin: Neg mg/dL WBC/hpf: NS (Not Seen)  ?Appearance: Slightly Cloudy Ketones: Neg mg/dL RBC/hpf: 40 - 60/hpf  ?Specific Gravity: 1.025 Blood: 3+ ery/uL Bacteria: NS (Not Seen)  ?pH: 5.5 Protein: Trace mg/dL Cystals: NS (Not Seen)  ?Glucose: Neg mg/dL Urobilinogen: 0.2 mg/dL Casts: NS (Not Seen)  ?  Nitrites: Neg Trichomonas: Not Present  ?  Leukocyte Esterase: Neg leu/uL Mucous: Present  ?    Epithelial Cells: NS (Not Seen)  ?    Yeast: NS (Not Seen)  ?    Sperm: Not Present  ? ? ?Notes: unspun specimen due to quantity ?  ? ?ASSESSMENT:  ?    ICD-10 Details  ?1 GU:   Gross hematuria - R31.0 Acute, Systemic Symptoms - He has intermittent hematuria with an enlarging left renal stone that has moved into the infundibular neck of a mid to lower calyx. I discussed URS vs ESWL and will get her set up for ESWL. She requests MAC. I reviewed the risks of ESWL including bleeding, infection, injury to the kidney or adjacent structures, failure to fragment the stone, need for ancillary procedures, thrombotic events, cardiac arrhythmias and sedation complications.  ? ?  ?2   Renal calculus - N20.0 Left, Chronic, Worsening  ? ?PLAN:    ? ?      Orders ?X-Rays: C.T. Stone Protocol Without I.V. Contrast  ?X-Ray Notes: History: ? ?Hematuria: Yes/No ? ?Patient to see MD after exam: Yes/No ? ?Previous exam: CT / IVP/ US/ KUB/ None ? ?When: ? ?Where: ? ?Diabetic: Yes/ No ? ?BUN/  Creatinine: ? ?Date of last BUN Creatinine: ? ?Weight in pounds: ? ?Allergy- IV Contrast: Yes/ No ? ?Conflicting diabetic meds: Yes/ No ? ?Diabetic Meds: ? ?Prior Authorization #: [id number] ? ?  ? ? ?      Schedule ?Return Visit/Planned Activity: Next Available Appointment - Schedule Surgery  ?Procedure: 03/29/2021 - ESWL - [id number] left  ? ? ?      Document ?Letter(s):  Created for Patient: Clinical Summary  ? ? ?     Notes:   CC: Dr. SReynold Bowen   ? ? ?

## 2021-03-29 ENCOUNTER — Encounter (HOSPITAL_BASED_OUTPATIENT_CLINIC_OR_DEPARTMENT_OTHER)
Admission: RE | Disposition: A | Discharge status: Home / Self Care | Payer: Self-pay | Source: Home / Self Care | Attending: Urology

## 2021-03-29 ENCOUNTER — Encounter (HOSPITAL_BASED_OUTPATIENT_CLINIC_OR_DEPARTMENT_OTHER): Payer: Self-pay | Admitting: Urology

## 2021-03-29 ENCOUNTER — Other Ambulatory Visit: Payer: Self-pay

## 2021-03-29 ENCOUNTER — Ambulatory Visit (HOSPITAL_BASED_OUTPATIENT_CLINIC_OR_DEPARTMENT_OTHER)
Admission: RE | Admit: 2021-03-29 | Discharge: 2021-03-29 | Disposition: A | Discharge status: Home / Self Care | Payer: Medicare Other | Attending: Urology | Admitting: Urology

## 2021-03-29 ENCOUNTER — Ambulatory Visit (HOSPITAL_COMMUNITY): Payer: Medicare Other

## 2021-03-29 DIAGNOSIS — N200 Calculus of kidney: Secondary | ICD-10-CM | POA: Diagnosis not present

## 2021-03-29 DIAGNOSIS — Z9889 Other specified postprocedural states: Secondary | ICD-10-CM | POA: Diagnosis not present

## 2021-03-29 DIAGNOSIS — I878 Other specified disorders of veins: Secondary | ICD-10-CM | POA: Diagnosis not present

## 2021-03-29 HISTORY — PX: EXTRACORPOREAL SHOCK WAVE LITHOTRIPSY: SHX1557

## 2021-03-29 SURGERY — LITHOTRIPSY, ESWL
Anesthesia: Monitor Anesthesia Care | Laterality: Left

## 2021-03-29 MED ORDER — ONDANSETRON HCL 4 MG/2ML IJ SOLN
INTRAMUSCULAR | Status: AC
  Filled 2021-03-29: qty 2

## 2021-03-29 MED ORDER — CIPROFLOXACIN HCL 500 MG PO TABS
500.0000 mg | ORAL_TABLET | ORAL | Status: AC
  Administered 2021-03-29: 500 mg via ORAL

## 2021-03-29 MED ORDER — DIPHENHYDRAMINE HCL 25 MG PO CAPS
25.0000 mg | ORAL_CAPSULE | ORAL | Status: AC
  Administered 2021-03-29: 25 mg via ORAL

## 2021-03-29 MED ORDER — LIDOCAINE HCL (PF) 2 % IJ SOLN
INTRAMUSCULAR | Status: AC
  Filled 2021-03-29: qty 5

## 2021-03-29 MED ORDER — TAMSULOSIN HCL 0.4 MG PO CAPS: 0.4000 mg | ORAL_CAPSULE | Freq: Every day | ORAL | 1 refills | Status: DC

## 2021-03-29 MED ORDER — PROPOFOL 1000 MG/100ML IV EMUL
INTRAVENOUS | Status: AC
  Filled 2021-03-29: qty 100

## 2021-03-29 MED ORDER — KETOROLAC TROMETHAMINE 30 MG/ML IJ SOLN
INTRAMUSCULAR | Status: AC
  Filled 2021-03-29: qty 1

## 2021-03-29 MED ORDER — TRAMADOL HCL 50 MG PO TABS: 50.0000 mg | ORAL_TABLET | Freq: Four times a day (QID) | ORAL | 0 refills | Status: DC | PRN

## 2021-03-29 MED ORDER — SODIUM CHLORIDE 0.9% FLUSH: 3.0000 mL | Freq: Two times a day (BID) | INTRAVENOUS | Status: DC

## 2021-03-29 MED ORDER — MIDAZOLAM HCL 2 MG/2ML IJ SOLN
INTRAMUSCULAR | Status: AC
  Filled 2021-03-29: qty 2

## 2021-03-29 MED ORDER — DIAZEPAM 5 MG PO TABS
10.0000 mg | ORAL_TABLET | ORAL | Status: AC
  Administered 2021-03-29: 10 mg via ORAL

## 2021-03-29 MED ORDER — CIPROFLOXACIN HCL 500 MG PO TABS
ORAL_TABLET | ORAL | Status: AC
  Filled 2021-03-29: qty 1

## 2021-03-29 MED ORDER — TRAMADOL HCL 50 MG PO TABS: 50.0000 mg | ORAL_TABLET | Freq: Four times a day (QID) | ORAL | Status: DC | PRN

## 2021-03-29 MED ORDER — DIPHENHYDRAMINE HCL 25 MG PO CAPS
ORAL_CAPSULE | ORAL | Status: AC
  Filled 2021-03-29: qty 1

## 2021-03-29 MED ORDER — DIAZEPAM 5 MG PO TABS
ORAL_TABLET | ORAL | Status: AC
  Filled 2021-03-29: qty 2

## 2021-03-29 MED ORDER — FENTANYL CITRATE (PF) 100 MCG/2ML IJ SOLN
INTRAMUSCULAR | Status: AC
  Filled 2021-03-29: qty 2

## 2021-03-29 MED ORDER — SODIUM CHLORIDE 0.9 % IV SOLN: INTRAVENOUS | Status: DC

## 2021-03-29 NOTE — Op Note (Signed)
See scanned PCS op note.  ?

## 2021-03-29 NOTE — Discharge Instructions (Signed)
?  Post Anesthesia Home Care Instructions  Activity: Get plenty of rest for the remainder of the day. A responsible individual must stay with you for 24 hours following the procedure.  For the next 24 hours, DO NOT: -Drive a car -Operate machinery -Drink alcoholic beverages -Take any medication unless instructed by your physician -Make any legal decisions or sign important papers.  Meals: Start with liquid foods such as gelatin or soup. Progress to regular foods as tolerated. Avoid greasy, spicy, heavy foods. If nausea and/or vomiting occur, drink only clear liquids until the nausea and/or vomiting subsides. Call your physician if vomiting continues.  

## 2021-03-29 NOTE — Anesthesia Preprocedure Evaluation (Deleted)
Anesthesia Evaluation  ? ? ?History of Anesthesia Complications ?(+) PONV and history of anesthetic complications ? ?Airway ? ? ? ? ? ? ? Dental ?  ?Pulmonary ? ?  ? ? ? ? ? ? ? Cardiovascular ?(-) hypertension(-) angina+ CAD and + Past MI  ?(-) CHF (-) dysrhythmias  ? ??1. The left ventricle has normal systolic function with an ejection  ?fraction of 60-65%. The cavity size was normal. Left ventricular diastolic  ?Doppler parameters are consistent with impaired relaxation.  ??2. The LV apical contractility and the LV function in general have  ?improved since the previous echo Mar 07, 2018.  ??3. The right ventricle has normal systolic function. The cavity was  ?normal. There is no increase in right ventricular wall thickness.  ??4. The aortic root is normal in size and structure.  ??5. The average left ventricular global longitudinal strain is -24.5 %.  ??6. The interatrial septum was not assessed.  ?  ?Neuro/Psych ?  ? GI/Hepatic ?negative GI ROS, Neg liver ROS,   ?Endo/Other  ?Hypothyroidism  ? Renal/GU ?Renal diseaseLab Results ?     Component                Value               Date                 ?     CREATININE               0.79                12/25/2018           ? ? ?LEFT RENAL STONE  ? ?  ?Musculoskeletal ?negative musculoskeletal ROS ?(+)  ? Abdominal ?  ?Peds ? Hematology ?negative hematology ROS ?(+)   ?Anesthesia Other Findings ? ? Reproductive/Obstetrics ? ?  ? ? ? ? ? ? ? ? ? ? ? ? ? ?  ?  ? ? ? ? ? ? ? ? ?Anesthesia Physical ?Anesthesia Plan ? ?ASA: 2 ? ?Anesthesia Plan: MAC  ? ?Post-op Pain Management: Minimal or no pain anticipated  ? ?Induction: Intravenous ? ?PONV Risk Score and Plan: 3 and Propofol infusion and Treatment may vary due to age or medical condition ? ?Airway Management Planned: Nasal Cannula ? ?Additional Equipment: None ? ?Intra-op Plan:  ? ?Post-operative Plan:  ? ?Informed Consent:  ? ?Plan Discussed with:  ? ?Anesthesia Plan Comments:  (Patient gone to [employer] truck prior to being accessed and consented for anesthesia.? Change of plan as posted for MAC)  ? ? ? ? ?Anesthesia Quick Evaluation ? ?

## 2021-03-29 NOTE — Interval H&P Note (Signed)
History and Physical Interval Note:  No change in stone position.  ? ?03/29/2021 ?9:46 AM ? ?Crystal Lloyd  has presented today for surgery, with the diagnosis of LEFT RENAL STONE.  The various methods of treatment have been discussed with the patient and family. After consideration of risks, benefits and other options for treatment, the patient has consented to  Procedure(s): ?LEFT EXTRACORPOREAL SHOCK WAVE LITHOTRIPSY (ESWL) (Left) as a surgical intervention.  The patient's history has been reviewed, patient examined, no change in status, stable for surgery.  I have reviewed the patient's chart and labs.  Questions were answered to the patient's satisfaction.   ? ? ?Irine Seal ? ? ?

## 2021-03-30 ENCOUNTER — Encounter (HOSPITAL_BASED_OUTPATIENT_CLINIC_OR_DEPARTMENT_OTHER): Payer: Self-pay | Admitting: Urology

## 2021-04-21 DIAGNOSIS — N202 Calculus of kidney with calculus of ureter: Secondary | ICD-10-CM | POA: Diagnosis not present

## 2021-05-26 DIAGNOSIS — E785 Hyperlipidemia, unspecified: Secondary | ICD-10-CM | POA: Diagnosis not present

## 2021-05-26 DIAGNOSIS — E039 Hypothyroidism, unspecified: Secondary | ICD-10-CM | POA: Diagnosis not present

## 2021-05-26 DIAGNOSIS — R7989 Other specified abnormal findings of blood chemistry: Secondary | ICD-10-CM | POA: Diagnosis not present

## 2021-05-26 DIAGNOSIS — E559 Vitamin D deficiency, unspecified: Secondary | ICD-10-CM | POA: Diagnosis not present

## 2021-06-01 DIAGNOSIS — R7301 Impaired fasting glucose: Secondary | ICD-10-CM | POA: Diagnosis not present

## 2021-06-01 DIAGNOSIS — E559 Vitamin D deficiency, unspecified: Secondary | ICD-10-CM | POA: Diagnosis not present

## 2021-06-01 DIAGNOSIS — I700 Atherosclerosis of aorta: Secondary | ICD-10-CM | POA: Diagnosis not present

## 2021-06-01 DIAGNOSIS — E039 Hypothyroidism, unspecified: Secondary | ICD-10-CM | POA: Diagnosis not present

## 2021-06-02 ENCOUNTER — Telehealth: Payer: Medicare Other | Admitting: Surgical

## 2021-06-15 ENCOUNTER — Other Ambulatory Visit: Payer: Self-pay

## 2021-06-15 ENCOUNTER — Ambulatory Visit (INDEPENDENT_AMBULATORY_CARE_PROVIDER_SITE_OTHER): Payer: Self-pay | Admitting: Plastic Surgery

## 2021-06-15 ENCOUNTER — Encounter: Payer: Self-pay | Admitting: Plastic Surgery

## 2021-06-15 DIAGNOSIS — H02834 Dermatochalasis of left upper eyelid: Secondary | ICD-10-CM

## 2021-06-15 DIAGNOSIS — H02839 Dermatochalasis of unspecified eye, unspecified eyelid: Secondary | ICD-10-CM | POA: Insufficient documentation

## 2021-06-15 DIAGNOSIS — H02831 Dermatochalasis of right upper eyelid: Secondary | ICD-10-CM

## 2021-06-15 NOTE — Addendum Note (Signed)
Addended by: Tresa Moore on: 06/15/2021 04:20 PM   Modules accepted: Orders

## 2021-06-15 NOTE — Progress Notes (Signed)
Already scheduled
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
## 2021-06-15 NOTE — Addendum Note (Signed)
Addended by: Tresa Moore on: 06/15/2021 03:44 PM   Modules accepted: Orders

## 2021-06-17 DIAGNOSIS — H524 Presbyopia: Secondary | ICD-10-CM | POA: Diagnosis not present

## 2021-06-18 ENCOUNTER — Telehealth: Payer: Self-pay

## 2021-06-18 NOTE — Telephone Encounter (Signed)
Faxed referral information to [facility] with confirmed receipt.

## 2021-06-22 ENCOUNTER — Ambulatory Visit (INDEPENDENT_AMBULATORY_CARE_PROVIDER_SITE_OTHER): Payer: Self-pay | Admitting: Plastic Surgery

## 2021-06-22 ENCOUNTER — Encounter: Payer: Self-pay | Admitting: Plastic Surgery

## 2021-06-22 DIAGNOSIS — Z719 Counseling, unspecified: Secondary | ICD-10-CM

## 2021-06-22 NOTE — Progress Notes (Signed)
Preoperative Dx: hyperpigmentation of face  Postoperative Dx:  same  Procedure: laser to face   Anesthesia: none  Description of Procedure:  Risks and complications were explained to the patient. Consent was confirmed and signed. Eye protection was placed. Time out was called and all information was confirmed to be correct. The area  area was prepped with alcohol and wiped dry. The BBL laser was set at 560 nm at 7.5 J/cm2 and 515 nm at 7 and 6.5 J. The face was lasered. The patient tolerated the procedure well and there were no complications. The patient is to follow up in 4 weeks.

## 2021-07-13 ENCOUNTER — Ambulatory Visit (INDEPENDENT_AMBULATORY_CARE_PROVIDER_SITE_OTHER): Payer: Self-pay | Admitting: Plastic Surgery

## 2021-07-13 ENCOUNTER — Encounter: Payer: Self-pay | Admitting: Plastic Surgery

## 2021-07-13 DIAGNOSIS — Z719 Counseling, unspecified: Secondary | ICD-10-CM

## 2021-07-13 NOTE — Progress Notes (Signed)
[place] Preoperative Dx: hyperpigmentation of face  Postoperative Dx:  same  Procedure: laser to face   Anesthesia: none  Description of Procedure:  Risks and complications were explained to the patient. Consent was confirmed and signed. Eye protection was placed. Time out was called and all information was confirmed to be correct. The area  area was prepped with alcohol and wiped dry. The BBL laser was set at 560 and 515 at 7 J/cm2.  Several dark spots were located with spot treatment as well at 10 J.  The face was lasered. The patient tolerated the procedure well and there were no complications. The patient is to follow up in 4 weeks.

## 2021-07-29 ENCOUNTER — Encounter: Payer: Self-pay | Admitting: Plastic Surgery

## 2021-07-29 ENCOUNTER — Ambulatory Visit (INDEPENDENT_AMBULATORY_CARE_PROVIDER_SITE_OTHER): Payer: Self-pay | Admitting: Plastic Surgery

## 2021-07-29 DIAGNOSIS — Z719 Counseling, unspecified: Secondary | ICD-10-CM

## 2021-07-29 NOTE — Progress Notes (Signed)
[place]  Preoperative Dx: facial hyperpigmentation and redness  Postoperative Dx:  same  Procedure: laser to face   Anesthesia: none  Description of Procedure:  Risks and complications were explained to the patient. Consent was confirmed and signed. Eye protection was placed. Time out was called and all information was confirmed to be correct. The area  area was prepped with alcohol and wiped dry. The BBL laser was set at 560 and 515 at 7.5 J/cm2. The face was lasered. The patient tolerated the procedure well and there were no complications. The patient is to follow up in 4 weeks.

## 2021-08-02 DIAGNOSIS — H02413 Mechanical ptosis of bilateral eyelids: Secondary | ICD-10-CM | POA: Diagnosis not present

## 2021-08-02 DIAGNOSIS — H57813 Brow ptosis, bilateral: Secondary | ICD-10-CM | POA: Diagnosis not present

## 2021-08-02 DIAGNOSIS — H02831 Dermatochalasis of right upper eyelid: Secondary | ICD-10-CM | POA: Diagnosis not present

## 2021-08-02 DIAGNOSIS — H02423 Myogenic ptosis of bilateral eyelids: Secondary | ICD-10-CM | POA: Diagnosis not present

## 2021-08-03 ENCOUNTER — Other Ambulatory Visit (INDEPENDENT_AMBULATORY_CARE_PROVIDER_SITE_OTHER): Payer: Medicare Other | Admitting: Plastic Surgery

## 2021-09-06 DIAGNOSIS — H02422 Myogenic ptosis of left eyelid: Secondary | ICD-10-CM | POA: Diagnosis not present

## 2021-09-06 DIAGNOSIS — H02421 Myogenic ptosis of right eyelid: Secondary | ICD-10-CM | POA: Diagnosis not present

## 2021-09-06 DIAGNOSIS — H02413 Mechanical ptosis of bilateral eyelids: Secondary | ICD-10-CM | POA: Diagnosis not present

## 2021-09-06 DIAGNOSIS — H02423 Myogenic ptosis of bilateral eyelids: Secondary | ICD-10-CM | POA: Diagnosis not present

## 2021-09-09 ENCOUNTER — Ambulatory Visit: Payer: Medicare Other | Admitting: Interventional Cardiology

## 2021-09-17 ENCOUNTER — Ambulatory Visit: Payer: Self-pay

## 2021-09-17 NOTE — Patient Outreach (Signed)
  Care Coordination   09/17/2021 Name: Crystal Lloyd MRN: [id number] DOB: [birth year]   Care Coordination Outreach Attempts:  An unsuccessful telephone outreach was attempted today to offer the patient information about available care coordination services as a benefit of their health plan.   Follow Up Plan:  Additional outreach attempts will be made to offer the patient care coordination information and services.   Encounter Outcome:  No Answer  Care Coordination Interventions Activated:  No   Care Coordination Interventions:  No, not indicated    Daneen Schick, BSW, CDP Social Worker, Certified Dementia Practitioner Care Coordination [phone]
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
## 2021-09-21 ENCOUNTER — Ambulatory Visit: Payer: Medicare Other | Attending: Interventional Cardiology | Admitting: Interventional Cardiology

## 2021-09-21 ENCOUNTER — Encounter: Payer: Self-pay | Admitting: Interventional Cardiology

## 2021-09-21 VITALS — BP 118/82 | HR 76 | Ht 60.0 in | Wt 121.0 lb

## 2021-09-21 DIAGNOSIS — R931 Abnormal findings on diagnostic imaging of heart and coronary circulation: Secondary | ICD-10-CM | POA: Diagnosis not present

## 2021-09-21 DIAGNOSIS — E782 Mixed hyperlipidemia: Secondary | ICD-10-CM

## 2021-09-21 DIAGNOSIS — I5181 Takotsubo syndrome: Secondary | ICD-10-CM | POA: Diagnosis not present

## 2021-09-21 NOTE — Patient Instructions (Signed)
Medication Instructions:  Your physician recommends that you continue on your current medications as directed. Please refer to the Current Medication list given to you today.  *If you need a refill on your cardiac medications before your next appointment, please call your pharmacy*   Lab Work: Your physician recommends that you return for lab work in the next week or so--NMR, ApoB, LPA.  This will be fasting.  The lab opens at 7:15 AM  If you have labs (blood work) drawn today and your tests are completely normal, you will receive your results only by: [place] (if you have MyChart) OR A paper copy in the mail If you have any lab test that is abnormal or we need to change your treatment, we will call you to review the results.   Testing/Procedures: Dr Irish Lack recommends you have a Calcium Score CT Scan   Follow-Up: At [facility], you and your health needs are our priority.  As part of our continuing mission to provide you with exceptional heart care, we have created designated Provider Care Teams.  These Care Teams include your primary Cardiologist (physician) and Advanced Practice Providers (APPs -  Physician Assistants and Nurse Practitioners) who all work together to provide you with the care you need, when you need it.  We recommend signing up for the patient portal called "MyChart".  Sign up information is provided on this After Visit Summary.  MyChart is used to connect with patients for Virtual Visits (Telemedicine).  Patients are able to view lab/test results, encounter notes, upcoming appointments, etc.  Non-urgent messages can be sent to your provider as well.   To learn more about what you can do with MyChart, go to [email or web address].    Your next appointment:   12 month(s)  The format for your next appointment:   In Person  Provider:   Larae Grooms, MD     Other Instructions    Important Information About Sugar

## 2021-09-28 ENCOUNTER — Ambulatory Visit: Payer: Medicare Other | Attending: Interventional Cardiology

## 2021-09-28 DIAGNOSIS — E782 Mixed hyperlipidemia: Secondary | ICD-10-CM | POA: Diagnosis not present

## 2021-09-29 ENCOUNTER — Other Ambulatory Visit: Payer: Self-pay | Admitting: *Deleted

## 2021-09-29 DIAGNOSIS — E782 Mixed hyperlipidemia: Secondary | ICD-10-CM

## 2021-09-29 LAB — NMR, LIPOPROFILE
Cholesterol, Total: 325 mg/dL — ABNORMAL HIGH (ref 100–199)
HDL Particle Number: 39.7 umol/L (ref >=30.5)
HDL-C: 57 mg/dL (ref >39)
LDL Particle Number: 3113 nmol/L — ABNORMAL HIGH (ref <1000)
LDL Size: 20.1 nm — ABNORMAL LOW (ref >20.5)
LDL-C (NIH Calc): 216 mg/dL — ABNORMAL HIGH (ref 0–99)
LP-IR Score: 82 — ABNORMAL HIGH (ref <=45)
Small LDL Particle Number: 1916 nmol/L — ABNORMAL HIGH (ref <=527)
Triglycerides: 262 mg/dL — ABNORMAL HIGH (ref 0–149)

## 2021-09-29 LAB — APOLIPOPROTEIN B: Apolipoprotein B: 155 mg/dL — ABNORMAL HIGH (ref <90)

## 2021-09-29 LAB — LIPOPROTEIN A (LPA): Lipoprotein (a): 141.7 nmol/L — ABNORMAL HIGH (ref <75.0)

## 2021-09-30 ENCOUNTER — Telehealth: Payer: Self-pay

## 2021-09-30 NOTE — Patient Outreach (Signed)
  Care Coordination   09/30/2021 Name: Crystal Lloyd MRN: [id number] DOB: 1945/04/03   Care Coordination Outreach Attempts:  A second unsuccessful outreach was attempted today to offer the patient with information about available care coordination services as a benefit of their health plan.     Follow Up Plan:  Additional outreach attempts will be made to offer the patient care coordination information and services.   Encounter Outcome:  No Answer  Care Coordination Interventions Activated:  No   Care Coordination Interventions:  No, not indicated    Daneen Schick, BSW, CDP Social Worker, Certified Dementia Practitioner Care Coordination [phone]

## 2021-10-08 DIAGNOSIS — H02421 Myogenic ptosis of right eyelid: Secondary | ICD-10-CM | POA: Diagnosis not present

## 2021-10-08 DIAGNOSIS — H43813 Vitreous degeneration, bilateral: Secondary | ICD-10-CM | POA: Diagnosis not present

## 2021-10-08 DIAGNOSIS — H25811 Combined forms of age-related cataract, right eye: Secondary | ICD-10-CM | POA: Diagnosis not present

## 2021-10-25 ENCOUNTER — Ambulatory Visit
Payer: Medicare Other | Attending: Cardiovascular Disease | Admitting: Pharmacist Clinician (PhC)/ Clinical Pharmacy Specialist

## 2021-10-25 ENCOUNTER — Encounter: Payer: Self-pay | Admitting: Pharmacist Clinician (PhC)/ Clinical Pharmacy Specialist

## 2021-10-25 DIAGNOSIS — G720 Drug-induced myopathy: Secondary | ICD-10-CM | POA: Diagnosis not present

## 2021-10-25 DIAGNOSIS — Z1389 Encounter for screening for other disorder: Secondary | ICD-10-CM

## 2021-10-25 DIAGNOSIS — T380X5A Adverse effect of glucocorticoids and synthetic analogues, initial encounter: Secondary | ICD-10-CM

## 2021-10-25 DIAGNOSIS — E782 Mixed hyperlipidemia: Secondary | ICD-10-CM | POA: Diagnosis not present

## 2021-10-25 LAB — URIC ACID: Uric Acid: 5.1 mg/dL (ref 3.1–7.9)

## 2021-10-25 NOTE — Assessment & Plan Note (Signed)
Patient with familial hyperlipidemia and intolerant to statins and PCSK9 inhibitors.  Reviewed options for lowering LDL cholesterol, including bempedoic acid and inclisiran.  Discussed mechanisms of action, dosing, side effects and potential decreases in LDL cholesterol.  Also reviewed cost information and potential options for patient assistance.  Answered all patient questions.  Based on this information, patient would prefer to start bempedoic acid.  Will prescribe the combination Nexlizet for maximum lipid lowering.  She has had problem with pain in her feet, so will do uric acid level today (at her request), then again after 3 weeks on medication.  Should she tolerate, will repeat labs in 2-3 months.
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
## 2021-10-25 NOTE — Patient Instructions (Signed)
Your Results:             Your most recent labs Goal  Total Cholesterol 325 < 200  Triglycerides 262 < 150  HDL (happy/good cholesterol) 39.7 > 40  LDL (lousy/bad cholesterol 216 < 70   Medication changes:   We will get a baseline uric acid level.  If good, we will start Nexlizet 180/10 mg once daily.  We will need to do a prior authorization to get this, but that should not be a problem.  Take 1 tablet daily and repeat uric acid levels after about 3 weeks.    Thank you for choosing [facility]

## 2021-10-27 ENCOUNTER — Telehealth: Payer: Self-pay | Admitting: *Deleted

## 2021-10-27 ENCOUNTER — Encounter (HOSPITAL_BASED_OUTPATIENT_CLINIC_OR_DEPARTMENT_OTHER): Payer: Self-pay

## 2021-10-27 ENCOUNTER — Ambulatory Visit (HOSPITAL_BASED_OUTPATIENT_CLINIC_OR_DEPARTMENT_OTHER)
Admission: RE | Admit: 2021-10-27 | Discharge: 2021-10-27 | Disposition: A | Discharge status: Home / Self Care | Payer: Medicare Other | Source: Ambulatory Visit | Attending: Interventional Cardiology | Admitting: Interventional Cardiology

## 2021-10-27 ENCOUNTER — Encounter: Payer: Self-pay | Admitting: Pharmacist Clinician (PhC)/ Clinical Pharmacy Specialist

## 2021-10-27 DIAGNOSIS — I5181 Takotsubo syndrome: Secondary | ICD-10-CM | POA: Insufficient documentation

## 2021-10-27 DIAGNOSIS — R911 Solitary pulmonary nodule: Secondary | ICD-10-CM

## 2021-10-27 DIAGNOSIS — R931 Abnormal findings on diagnostic imaging of heart and coronary circulation: Secondary | ICD-10-CM | POA: Insufficient documentation

## 2021-10-27 DIAGNOSIS — E782 Mixed hyperlipidemia: Secondary | ICD-10-CM | POA: Insufficient documentation

## 2021-10-27 DIAGNOSIS — Z1389 Encounter for screening for other disorder: Secondary | ICD-10-CM

## 2021-10-27 MED ORDER — NEXLIZET 180-10 MG PO TABS: 1.0000 | ORAL_TABLET | Freq: Every day | ORAL | 3 refills | Status: DC

## 2021-10-27 NOTE — Telephone Encounter (Signed)
Nexlizet approved to 10/26/22  Key  [id number]

## 2021-10-27 NOTE — Telephone Encounter (Signed)
I spoke with patient and reviewed results with her.  She would like to proceed with CT Scan.  Will forward to Dr Irish Lack to see what type of scan to order.

## 2021-10-27 NOTE — Telephone Encounter (Signed)
-----   Message from Jettie Booze, MD sent at 10/27/2021  5:31 PM EDT ----- On chest CT portion: "Tree-in-bud opacities and small nodules in the inferior RIGHT upper lobe largest 4 mm area not seen on previous imaging. Findings are likely infectious or inflammatory; however, given that these are new findings would consider short interval follow-up with CT in this patient with history of bilateral mastectomy." I will defer to her if she wants a formal CT.  She is a radiologist.  Calcium score relatively low.  WOuld continue to treat lipids along with healthy lifestyle.  I think she is starting bempedoic acid with lipid clinic

## 2021-10-29 NOTE — Telephone Encounter (Signed)
Reviewed with Dr Nance Pew in radiology who recommends patient have Chest CT with contrast in 3 months. Message sent to patient through my chart

## 2021-11-01 ENCOUNTER — Ambulatory Visit: Payer: Self-pay

## 2021-11-01 DIAGNOSIS — H53483 Generalized contraction of visual field, bilateral: Secondary | ICD-10-CM | POA: Diagnosis not present

## 2021-11-01 NOTE — Telephone Encounter (Signed)
Per DPR it is OK to leave a message.  Message left that message had been sent to her through my chart and to call office if any questions.

## 2021-11-01 NOTE — Patient Outreach (Signed)
  Care Coordination   Initial Visit Note   11/01/2021 Name: JENISSA TYRELL MRN: [id number] DOB: 04-28-1945  MASAE LUKACS is a 76 y.o. year old female who sees [place], Annie Main, MD for primary care. I spoke with  [name] by phone today.  What matters to the patients health and wellness today?  Patient declined to participate in today's call.    Goals Addressed   None     SDOH assessments and interventions completed:  No     Care Coordination Interventions Activated:  No  Care Coordination Interventions:  No, not indicated   Follow up plan: No further intervention required.   Encounter Outcome:  Pt. Refused   Daneen Schick, BSW, CDP Social Worker, Certified Dementia Practitioner [facility]  Care Coordination [phone]

## 2021-11-04 DIAGNOSIS — H2511 Age-related nuclear cataract, right eye: Secondary | ICD-10-CM | POA: Diagnosis not present

## 2021-11-19 DIAGNOSIS — M8589 Other specified disorders of bone density and structure, multiple sites: Secondary | ICD-10-CM | POA: Diagnosis not present

## 2021-11-26 DIAGNOSIS — E039 Hypothyroidism, unspecified: Secondary | ICD-10-CM | POA: Diagnosis not present

## 2021-11-26 DIAGNOSIS — E559 Vitamin D deficiency, unspecified: Secondary | ICD-10-CM | POA: Diagnosis not present

## 2021-11-26 DIAGNOSIS — R7301 Impaired fasting glucose: Secondary | ICD-10-CM | POA: Diagnosis not present

## 2021-11-26 DIAGNOSIS — I700 Atherosclerosis of aorta: Secondary | ICD-10-CM | POA: Diagnosis not present

## 2021-11-29 DIAGNOSIS — H2512 Age-related nuclear cataract, left eye: Secondary | ICD-10-CM | POA: Diagnosis not present

## 2021-11-29 DIAGNOSIS — H57813 Brow ptosis, bilateral: Secondary | ICD-10-CM | POA: Diagnosis not present

## 2021-11-29 DIAGNOSIS — Z8669 Personal history of other diseases of the nervous system and sense organs: Secondary | ICD-10-CM | POA: Diagnosis not present

## 2021-11-29 DIAGNOSIS — H02423 Myogenic ptosis of bilateral eyelids: Secondary | ICD-10-CM | POA: Diagnosis not present

## 2021-11-29 DIAGNOSIS — H02413 Mechanical ptosis of bilateral eyelids: Secondary | ICD-10-CM | POA: Diagnosis not present

## 2021-12-01 ENCOUNTER — Encounter (HOSPITAL_BASED_OUTPATIENT_CLINIC_OR_DEPARTMENT_OTHER): Payer: Self-pay | Admitting: *Deleted

## 2021-12-01 DIAGNOSIS — R82998 Other abnormal findings in urine: Secondary | ICD-10-CM | POA: Diagnosis not present

## 2021-12-01 DIAGNOSIS — N390 Urinary tract infection, site not specified: Secondary | ICD-10-CM | POA: Diagnosis not present

## 2021-12-02 DIAGNOSIS — H2512 Age-related nuclear cataract, left eye: Secondary | ICD-10-CM | POA: Diagnosis not present

## 2021-12-06 DIAGNOSIS — N390 Urinary tract infection, site not specified: Secondary | ICD-10-CM | POA: Diagnosis not present

## 2021-12-07 DIAGNOSIS — N200 Calculus of kidney: Secondary | ICD-10-CM | POA: Diagnosis not present

## 2021-12-07 DIAGNOSIS — R310 Gross hematuria: Secondary | ICD-10-CM | POA: Diagnosis not present

## 2021-12-09 ENCOUNTER — Other Ambulatory Visit: Payer: Self-pay | Admitting: Urology

## 2021-12-15 ENCOUNTER — Encounter (HOSPITAL_BASED_OUTPATIENT_CLINIC_OR_DEPARTMENT_OTHER): Payer: Self-pay | Admitting: Urology

## 2021-12-15 NOTE — Progress Notes (Signed)
Left voice mail to return call for instructions 

## 2021-12-15 NOTE — Progress Notes (Signed)
Pre-op phone call complete. Date of surgery and arrival time confirmed. Patient allergies, medical history, and medications verified. Patient advised to stop all vitamins. Patient can take Levothyroxine morning of procedure. Patient told she can have clear liquids until 0200 or choose to be NPO at midnight. Patient denies any recent history of chest pain or COVID. Driver secured.

## 2021-12-18 NOTE — H&P (Signed)
have blood in my urine.     12/07/21: Crystal Lloyd returns today with recurrent gross hematuria that became obvious on Friday when she got a specimen for her PCP. She had another done yesterday and a culture was done. She has at most mild urgency and frequency but no dysuira. There is some fullness in the suprapubic area. About 6 weeks ago she had some right flank discomfort. She strained her urine and got some specks of stones. She has a history of stones and had ESWL in March of this year for a left sided stone. Her last CT in 2/23 demonstrated bilateral stones but only the left was treated.   04/21/2021: 76 year old female who underwent left-sided ESWL presents today for follow-up. She reports passing small fragments. She denies fevers and chills. She has had resolution of gross hematuria. She has had resolution of her pain. She denies urgency and frequency.   Crystal Lloyd returns today with the onset of gross hematuria last Friday night. The bleeding has persisted. She has mild, non-descript pain in the lower abdomen and mildly in the left back intermittently. She has no nausea or fever. She has no increased frequency or urgency. The urine has 40-60 RBC's.    ALLERGIES: Phenazocine    MEDICATIONS: Nexlizet 180 mg-10 mg tablet 1 tablet PO Daily  Synthroid  Vitamin D     GU PSH: ESWL - 03/29/2021 Locm 300-399Mg/Ml Iodine,1Ml - 09/22/2020 Ureteroscopic laser litho - 09/30/2020     NON-GU PSH: Appendectomy Back surgery, spine fusion Breast mastectomy, Bilateral Knee Arthroscopy/surgery - 1988 Partial colectomy Tonsillectomy     GU PMH: Gross hematuria - 04/21/2021, He has intermittent hematuria with an enlarging left renal stone that has moved into the infundibular neck of a mid to lower calyx. I discussed URS vs ESWL and will get her set up for ESWL. She requests MAC. I reviewed the risks of ESWL including bleeding, infection, injury to the kidney or adjacent structures, failure to fragment the  stone, need for ancillary procedures, thrombotic events, cardiac arrhythmias and sedation complications. , - 03/16/2021, - 09/23/2020, - 09/22/2020 Renal and ureteral calculus - 04/21/2021, - 12/30/2020, - 10/07/2020, - 09/22/2020, She passed her ureteral stone and has two small renal stones. I will analyze her stone and discussed some possible causes including her recent decreased activity with the back surgery and a course of antibiotic therapy in 5/18. I reviewed diet and hydration recommendations and will have her return in 6 months with a KUB. , - 2019 Renal calculus - 03/16/2021, She has bilateral renal stones without obstruction but there is a 10 x67m stone in the right renal pelvis. I discussed options for therapy including ESWL, URS and PCNL. She is traveling to [place]20 and is reluctant to go without being treated even though she is at low risk for subsequent obstruction from this stone. She is leaning toward ureteroscopy which I would like to get done next week if possible but I am out of town. I will try to get it scheduled with one of my partners. I have reviewed the risks of ureteroscopy including bleeding, infection, ureteral injury, need for a stent or secondary procedures, thrombotic events and anesthetic complications. , - 09/23/2020 Acute Cystitis/UTI - 10/07/2020    NON-GU PMH: Asthma Breast Cancer, History Diverticulitis Hypercholesterolemia Hypothyroidism    FAMILY HISTORY: 2 sons - Other Congestive Heart Failure - Father Hypertension - Mother   SOCIAL HISTORY: Marital Status: Married Preferred Language: English; Race: White Current Smoking Status: Patient has  never smoked.   Tobacco Use Assessment Completed: Used Tobacco in last 30 days? Drinks 1 caffeinated drink per day.    REVIEW OF SYSTEMS:    GU Review Female:   Patient reports frequent urination and get up at night to urinate. Patient denies hard to postpone urination, burning /pain with urination, leakage of  urine, stream starts and stops, trouble starting your stream, have to strain to urinate, and being pregnant.  Gastrointestinal (Upper):   Patient denies nausea, vomiting, and indigestion/ heartburn.  Gastrointestinal (Lower):   Patient denies diarrhea and constipation.  Constitutional:   Patient denies fatigue, night sweats, fever, and weight loss.  Skin:   Patient denies skin rash/ lesion and itching.  Eyes:   Patient denies blurred vision and double vision.  Ears/ Nose/ Throat:   Patient denies sore throat and sinus problems.  Hematologic/Lymphatic:   Patient denies swollen glands and easy bruising.  Cardiovascular:   Patient denies leg swelling and chest pains.  Respiratory:   Patient denies cough and shortness of breath.  Endocrine:   Patient denies excessive thirst.  Musculoskeletal:   Patient denies back pain and joint pain.  Neurological:   Patient denies headaches and dizziness.  Psychologic:   Patient denies depression and anxiety.   Notes: Urgency    VITAL SIGNS:      12/07/2021 08:16 AM  Weight 115 lb / 52.16 kg  Height 60 in / 152.4 cm  BP 135/80 mmHg  Heart Rate 69 /min  Temperature 97.7 F / 36.5 C  BMI 22.5 kg/m   MULTI-SYSTEM PHYSICAL EXAMINATION:    Constitutional: Well-nourished. No physical deformities. Normally developed. Good grooming.  Respiratory: No labored breathing, no use of accessory muscles.      Complexity of Data:  Records Review:   Previous Patient Records  Urine Test Review:   Urinalysis  X-Ray Review: [facility] Protocol: Reviewed Films. Reviewed Report. Discussed With Patient.     PROCEDURES:         KUB - [id number]  A single view of the abdomen is obtained. There is a 7.60m stone in the area of the right renal pelvis. There may be a RLQ stone. No other stones are seen. There is stable spinal hardware. No gas or soft tissue abnormalities are noted.       Patient confirmed No Neulasta OnPro Device.           Urinalysis w/Scope Dipstick  Dipstick Cont'd Micro  Color: Yellow Bilirubin: Neg mg/dL WBC/hpf: 6 - 10/hpf  Appearance: Cloudy Ketones: Neg mg/dL RBC/hpf: 40 - 60/hpf  Specific Gravity: <=1.005 Blood: 3+ ery/uL Bacteria: Mod (26-50/hpf)  pH: 6.0 Protein: 2+ mg/dL Cystals: Ca Oxalate  Glucose: Neg mg/dL Urobilinogen: 0.2 mg/dL Casts: NS (Not Seen)    Nitrites: Neg Trichomonas: Not Present    Leukocyte Esterase: Neg leu/uL Mucous: Not Present      Epithelial Cells: NS (Not Seen)      Yeast: NS (Not Seen)      Sperm: Not Present    ASSESSMENT:      ICD-10 Details  1 GU:   Gross hematuria - RG81.8Acute, Uncomplicated - Her right renal stone had enlarged and is now in the area of the renal pelvis. I discussed options and will get her set up for ESWL. She is aware of the risks from her prior procedure.   2   Renal calculus - N20.0 Right, Chronic, Worsening   PLAN:  Orders X-Rays: KUB          Schedule Return Visit/Planned Activity: Next Available Appointment - Schedule Surgery

## 2021-12-20 ENCOUNTER — Ambulatory Visit (HOSPITAL_COMMUNITY): Payer: Medicare Other

## 2021-12-20 ENCOUNTER — Encounter (HOSPITAL_BASED_OUTPATIENT_CLINIC_OR_DEPARTMENT_OTHER)
Admission: RE | Disposition: A | Discharge status: Home / Self Care | Payer: Self-pay | Source: Home / Self Care | Attending: Urology

## 2021-12-20 ENCOUNTER — Other Ambulatory Visit: Payer: Self-pay

## 2021-12-20 ENCOUNTER — Ambulatory Visit (HOSPITAL_BASED_OUTPATIENT_CLINIC_OR_DEPARTMENT_OTHER)
Admission: RE | Admit: 2021-12-20 | Discharge: 2021-12-20 | Disposition: A | Discharge status: Home / Self Care | Payer: Medicare Other | Attending: Urology | Admitting: Urology

## 2021-12-20 ENCOUNTER — Encounter (HOSPITAL_BASED_OUTPATIENT_CLINIC_OR_DEPARTMENT_OTHER): Payer: Self-pay | Admitting: Urology

## 2021-12-20 DIAGNOSIS — I878 Other specified disorders of veins: Secondary | ICD-10-CM | POA: Diagnosis not present

## 2021-12-20 DIAGNOSIS — J45909 Unspecified asthma, uncomplicated: Secondary | ICD-10-CM | POA: Insufficient documentation

## 2021-12-20 DIAGNOSIS — N200 Calculus of kidney: Secondary | ICD-10-CM | POA: Insufficient documentation

## 2021-12-20 DIAGNOSIS — Z853 Personal history of malignant neoplasm of breast: Secondary | ICD-10-CM | POA: Diagnosis not present

## 2021-12-20 DIAGNOSIS — K6389 Other specified diseases of intestine: Secondary | ICD-10-CM | POA: Diagnosis not present

## 2021-12-20 HISTORY — PX: EXTRACORPOREAL SHOCK WAVE LITHOTRIPSY: SHX1557

## 2021-12-20 SURGERY — LITHOTRIPSY, ESWL
Anesthesia: LOCAL | Laterality: Right

## 2021-12-20 MED ORDER — DIPHENHYDRAMINE HCL 25 MG PO CAPS
25.0000 mg | ORAL_CAPSULE | ORAL | Status: AC
  Administered 2021-12-20: 25 mg via ORAL

## 2021-12-20 MED ORDER — ONDANSETRON HCL 4 MG PO TABS: 4.0000 mg | ORAL_TABLET | Freq: Every day | ORAL | 1 refills | Status: DC | PRN

## 2021-12-20 MED ORDER — OXYCODONE-ACETAMINOPHEN 5-325 MG PO TABS: 1.0000 | ORAL_TABLET | ORAL | 0 refills | Status: DC | PRN

## 2021-12-20 MED ORDER — DIAZEPAM 5 MG PO TABS
10.0000 mg | ORAL_TABLET | ORAL | Status: AC
  Administered 2021-12-20: 5 mg via ORAL

## 2021-12-20 MED ORDER — SODIUM CHLORIDE 0.9 % IV SOLN: INTRAVENOUS | Status: DC

## 2021-12-20 MED ORDER — DIPHENHYDRAMINE HCL 25 MG PO CAPS
ORAL_CAPSULE | ORAL | Status: AC
  Filled 2021-12-20: qty 1

## 2021-12-20 MED ORDER — CIPROFLOXACIN HCL 500 MG PO TABS
ORAL_TABLET | ORAL | Status: AC
  Filled 2021-12-20: qty 1

## 2021-12-20 MED ORDER — DIAZEPAM 5 MG PO TABS
ORAL_TABLET | ORAL | Status: AC
  Filled 2021-12-20: qty 2

## 2021-12-20 MED ORDER — CIPROFLOXACIN HCL 500 MG PO TABS
500.0000 mg | ORAL_TABLET | ORAL | Status: AC
  Administered 2021-12-20: 500 mg via ORAL

## 2021-12-20 NOTE — Discharge Instructions (Signed)
Percocet prescription will be sent in through office, per Dr. Milford Cage.    Post Anesthesia Home Care Instructions  Activity: Get plenty of rest for the remainder of the day. A responsible individual must stay with you for 24 hours following the procedure.  For the next 24 hours, DO NOT: -Drive a car -Paediatric nurse -Drink alcoholic beverages -Take any medication unless instructed by your physician -Make any legal decisions or sign important papers.  Meals: Start with liquid foods such as gelatin or soup. Progress to regular foods as tolerated. Avoid greasy, spicy, heavy foods. If nausea and/or vomiting occur, drink only clear liquids until the nausea and/or vomiting subsides. Call your physician if vomiting continues.  Special Instructions/Symptoms: Your throat may feel dry or sore from the anesthesia or the breathing tube placed in your throat during surgery. If this causes discomfort, gargle with warm salt water. The discomfort should disappear within 24 hours.

## 2021-12-20 NOTE — Op Note (Signed)
[facility] Operative Note  Treating Physician: Shandon Matson B Zariya Minner, MD  Pre-op diagnosis: Right renal calculus  Post-op diagnosis: Same   Procedure: Right renal ESWL  EBL:Minimal  See [facility] OP note scanned into chart. Also because of the size, density, location and other factors that cannot be anticipated I feel this will likely be a staged procedure. This fact supersedes any indication in the scanned [facility] operative note to the contrary.  Satchel Heidinger B Loraina Stauffer,MD [facility]  

## 2021-12-21 ENCOUNTER — Encounter (HOSPITAL_BASED_OUTPATIENT_CLINIC_OR_DEPARTMENT_OTHER): Payer: Self-pay | Admitting: Urology

## 2021-12-23 ENCOUNTER — Other Ambulatory Visit (HOSPITAL_COMMUNITY)
Admission: RE | Admit: 2021-12-23 | Discharge: 2021-12-23 | Disposition: A | Discharge status: Home / Self Care | Payer: Medicare Other | Source: Ambulatory Visit | Attending: Obstetrics & Gynecology | Admitting: Obstetrics & Gynecology

## 2021-12-23 ENCOUNTER — Ambulatory Visit (INDEPENDENT_AMBULATORY_CARE_PROVIDER_SITE_OTHER): Payer: Medicare Other | Admitting: Obstetrics & Gynecology

## 2021-12-23 ENCOUNTER — Encounter (HOSPITAL_BASED_OUTPATIENT_CLINIC_OR_DEPARTMENT_OTHER): Payer: Self-pay | Admitting: Obstetrics & Gynecology

## 2021-12-23 VITALS — BP 133/83 | HR 59 | Ht 60.0 in | Wt 118.2 lb

## 2021-12-23 DIAGNOSIS — Z90722 Acquired absence of ovaries, bilateral: Secondary | ICD-10-CM

## 2021-12-23 DIAGNOSIS — N952 Postmenopausal atrophic vaginitis: Secondary | ICD-10-CM

## 2021-12-23 DIAGNOSIS — D013 Carcinoma in situ of anus and anal canal: Secondary | ICD-10-CM | POA: Diagnosis not present

## 2021-12-23 DIAGNOSIS — Z853 Personal history of malignant neoplasm of breast: Secondary | ICD-10-CM | POA: Diagnosis not present

## 2021-12-23 DIAGNOSIS — M8580 Other specified disorders of bone density and structure, unspecified site: Secondary | ICD-10-CM

## 2021-12-23 DIAGNOSIS — Z9079 Acquired absence of other genital organ(s): Secondary | ICD-10-CM

## 2021-12-23 DIAGNOSIS — Z124 Encounter for screening for malignant neoplasm of cervix: Secondary | ICD-10-CM | POA: Diagnosis not present

## 2021-12-23 DIAGNOSIS — Z9189 Other specified personal risk factors, not elsewhere classified: Secondary | ICD-10-CM
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
## 2021-12-24 LAB — CYTOLOGY - PAP: Diagnosis: NEGATIVE

## 2021-12-31 ENCOUNTER — Telehealth: Payer: Self-pay | Admitting: Gastroenterology

## 2021-12-31 DIAGNOSIS — Z1331 Encounter for screening for depression: Secondary | ICD-10-CM | POA: Diagnosis not present

## 2021-12-31 DIAGNOSIS — I2510 Atherosclerotic heart disease of native coronary artery without angina pectoris: Secondary | ICD-10-CM | POA: Diagnosis not present

## 2021-12-31 DIAGNOSIS — E039 Hypothyroidism, unspecified: Secondary | ICD-10-CM | POA: Diagnosis not present

## 2021-12-31 DIAGNOSIS — Z0000 Encounter for general adult medical examination without abnormal findings: Secondary | ICD-10-CM | POA: Diagnosis not present

## 2021-12-31 DIAGNOSIS — Z1389 Encounter for screening for other disorder: Secondary | ICD-10-CM | POA: Diagnosis not present

## 2021-12-31 DIAGNOSIS — I700 Atherosclerosis of aorta: Secondary | ICD-10-CM | POA: Diagnosis not present

## 2021-12-31 NOTE — Telephone Encounter (Signed)
Good Morning Dr Tarri Glenn  We have received a referral for patient to have a colon procedure.   Patient is requesting to transfer gi care to you. She use to be a patient of digestive health. Records are available for review in [facility]. Please review and advise on scheduling.  Thank you

## 2022-01-05 DIAGNOSIS — N200 Calculus of kidney: Secondary | ICD-10-CM | POA: Diagnosis not present

## 2022-01-26 ENCOUNTER — Ambulatory Visit (HOSPITAL_BASED_OUTPATIENT_CLINIC_OR_DEPARTMENT_OTHER)
Admission: RE | Admit: 2022-01-26 | Discharge: 2022-01-26 | Disposition: A | Discharge status: Home / Self Care | Payer: Medicare Other | Source: Ambulatory Visit | Attending: Interventional Cardiology | Admitting: Interventional Cardiology

## 2022-01-26 DIAGNOSIS — J9811 Atelectasis: Secondary | ICD-10-CM | POA: Diagnosis not present

## 2022-01-26 DIAGNOSIS — R911 Solitary pulmonary nodule: Secondary | ICD-10-CM | POA: Insufficient documentation

## 2022-01-26 DIAGNOSIS — R918 Other nonspecific abnormal finding of lung field: Secondary | ICD-10-CM | POA: Diagnosis not present

## 2022-01-26 MED ORDER — IOHEXOL 300 MG/ML  SOLN
100.0000 mL | Freq: Once | INTRAMUSCULAR | Status: AC | PRN
  Administered 2022-01-26: 60 mL via INTRAVENOUS

## 2022-01-26 NOTE — Telephone Encounter (Signed)
Patient called to follow up on transfer of care.   Printed records off for you to further review.   Please advise.

## 2022-01-27 NOTE — Telephone Encounter (Signed)
I am at the hospital this week but will review those records when I return to the office.

## 2022-01-28 ENCOUNTER — Other Ambulatory Visit: Payer: Self-pay | Admitting: *Deleted

## 2022-01-28 DIAGNOSIS — R911 Solitary pulmonary nodule: Secondary | ICD-10-CM

## 2022-01-31 DIAGNOSIS — H53453 Other localized visual field defect, bilateral: Secondary | ICD-10-CM | POA: Diagnosis not present

## 2022-01-31 DIAGNOSIS — H02423 Myogenic ptosis of bilateral eyelids: Secondary | ICD-10-CM | POA: Diagnosis not present

## 2022-01-31 DIAGNOSIS — H02403 Unspecified ptosis of bilateral eyelids: Secondary | ICD-10-CM | POA: Diagnosis not present

## 2022-02-03 ENCOUNTER — Encounter: Payer: Self-pay | Admitting: Gastroenterology

## 2022-03-09 ENCOUNTER — Other Ambulatory Visit (HOSPITAL_COMMUNITY): Payer: Self-pay | Admitting: Orthopedic Surgery

## 2022-03-09 DIAGNOSIS — M21622 Bunionette of left foot: Secondary | ICD-10-CM | POA: Diagnosis not present

## 2022-03-09 DIAGNOSIS — M2042 Other hammer toe(s) (acquired), left foot: Secondary | ICD-10-CM | POA: Diagnosis not present

## 2022-03-09 DIAGNOSIS — M7742 Metatarsalgia, left foot: Secondary | ICD-10-CM | POA: Diagnosis not present

## 2022-03-21 DIAGNOSIS — H524 Presbyopia: Secondary | ICD-10-CM | POA: Diagnosis not present

## 2022-03-28 ENCOUNTER — Other Ambulatory Visit: Payer: Self-pay

## 2022-03-28 ENCOUNTER — Encounter: Payer: Medicare Other | Admitting: Gastroenterology

## 2022-03-28 ENCOUNTER — Encounter (HOSPITAL_BASED_OUTPATIENT_CLINIC_OR_DEPARTMENT_OTHER): Payer: Self-pay | Admitting: Orthopedic Surgery

## 2022-03-29 ENCOUNTER — Encounter: Payer: Self-pay | Admitting: Gastroenterology

## 2022-03-30 NOTE — Progress Notes (Signed)
       Patient Instructions  The night before surgery:  No food after midnight. ONLY clear liquids after midnight  The day of surgery (if you do NOT have diabetes):  Drink ONE (1) Pre-Surgery Clear Ensure as directed.   This drink was given to you during your hospital  pre-op appointment visit. The pre-op nurse will instruct you on the time to drink the  Pre-Surgery Ensure depending on your surgery time. Finish the drink at the designated time by the pre-op nurse.  Nothing else to drink after completing the  Pre-Surgery Clear Ensure.  The day of surgery (if you have diabetes): Drink ONE (1) Gatorade 2 (G2) as directed. This drink was given to you during your hospital  pre-op appointment visit.  The pre-op nurse will instruct you on the time to drink the   Gatorade 2 (G2) depending on your surgery time. Color of the Gatorade may vary. Red is not allowed. Nothing else to drink after completing the  Gatorade 2 (G2).         If you have questions, please contact your surgeon's office.Gave patient CHG soap with instructions, patient verbalized understanding.

## 2022-03-31 ENCOUNTER — Encounter (HOSPITAL_BASED_OUTPATIENT_CLINIC_OR_DEPARTMENT_OTHER)
Admission: RE | Disposition: A | Discharge status: Home / Self Care | Payer: Self-pay | Source: Home / Self Care | Attending: Orthopedic Surgery

## 2022-03-31 ENCOUNTER — Ambulatory Visit (HOSPITAL_BASED_OUTPATIENT_CLINIC_OR_DEPARTMENT_OTHER)
Admission: RE | Admit: 2022-03-31 | Discharge: 2022-03-31 | Disposition: A | Discharge status: Home / Self Care | Payer: Medicare Other | Attending: Orthopedic Surgery | Admitting: Orthopedic Surgery

## 2022-03-31 ENCOUNTER — Ambulatory Visit (HOSPITAL_BASED_OUTPATIENT_CLINIC_OR_DEPARTMENT_OTHER): Payer: Medicare Other | Admitting: Certified Registered"

## 2022-03-31 ENCOUNTER — Ambulatory Visit (HOSPITAL_BASED_OUTPATIENT_CLINIC_OR_DEPARTMENT_OTHER): Payer: Medicare Other

## 2022-03-31 ENCOUNTER — Other Ambulatory Visit: Payer: Self-pay

## 2022-03-31 ENCOUNTER — Encounter (HOSPITAL_BASED_OUTPATIENT_CLINIC_OR_DEPARTMENT_OTHER): Payer: Self-pay | Admitting: Orthopedic Surgery

## 2022-03-31 DIAGNOSIS — M2042 Other hammer toe(s) (acquired), left foot: Secondary | ICD-10-CM

## 2022-03-31 DIAGNOSIS — M21612 Bunion of left foot: Secondary | ICD-10-CM | POA: Diagnosis not present

## 2022-03-31 DIAGNOSIS — E039 Hypothyroidism, unspecified: Secondary | ICD-10-CM | POA: Insufficient documentation

## 2022-03-31 DIAGNOSIS — Z923 Personal history of irradiation: Secondary | ICD-10-CM | POA: Diagnosis not present

## 2022-03-31 DIAGNOSIS — J45909 Unspecified asthma, uncomplicated: Secondary | ICD-10-CM | POA: Diagnosis not present

## 2022-03-31 DIAGNOSIS — Z9013 Acquired absence of bilateral breasts and nipples: Secondary | ICD-10-CM | POA: Insufficient documentation

## 2022-03-31 DIAGNOSIS — M2022 Hallux rigidus, left foot: Secondary | ICD-10-CM

## 2022-03-31 DIAGNOSIS — M19072 Primary osteoarthritis, left ankle and foot: Secondary | ICD-10-CM | POA: Insufficient documentation

## 2022-03-31 DIAGNOSIS — M21622 Bunionette of left foot: Secondary | ICD-10-CM

## 2022-03-31 DIAGNOSIS — I252 Old myocardial infarction: Secondary | ICD-10-CM | POA: Insufficient documentation

## 2022-03-31 DIAGNOSIS — M24575 Contracture, left foot: Secondary | ICD-10-CM | POA: Diagnosis not present

## 2022-03-31 DIAGNOSIS — Z01818 Encounter for other preprocedural examination: Secondary | ICD-10-CM

## 2022-03-31 DIAGNOSIS — I2510 Atherosclerotic heart disease of native coronary artery without angina pectoris: Secondary | ICD-10-CM | POA: Diagnosis not present

## 2022-03-31 DIAGNOSIS — M216X2 Other acquired deformities of left foot: Secondary | ICD-10-CM | POA: Diagnosis not present

## 2022-03-31 DIAGNOSIS — Z7722 Contact with and (suspected) exposure to environmental tobacco smoke (acute) (chronic): Secondary | ICD-10-CM | POA: Diagnosis not present

## 2022-03-31 DIAGNOSIS — Z853 Personal history of malignant neoplasm of breast: Secondary | ICD-10-CM | POA: Diagnosis not present

## 2022-03-31 DIAGNOSIS — Z9221 Personal history of antineoplastic chemotherapy: Secondary | ICD-10-CM | POA: Insufficient documentation

## 2022-03-31 HISTORY — PX: BUNIONECTOMY WITH HAMMERTOE RECONSTRUCTION: SHX5600

## 2022-03-31 HISTORY — PX: FOOT SURGERY: SHX648

## 2022-03-31 HISTORY — PX: ARTHRODESIS METATARSALPHALANGEAL JOINT (MTPJ): SHX6566

## 2022-03-31 SURGERY — FUSION, JOINT, GREAT TOE
Anesthesia: General | Site: Foot | Laterality: Left

## 2022-03-31 MED ORDER — FENTANYL CITRATE (PF) 100 MCG/2ML IJ SOLN
50.0000 ug | Freq: Once | INTRAMUSCULAR | Status: AC
  Administered 2022-03-31: 50 ug via INTRAVENOUS

## 2022-03-31 MED ORDER — VANCOMYCIN HCL 500 MG IV SOLR
INTRAVENOUS | Status: DC | PRN
  Administered 2022-03-31: 500 mg via TOPICAL

## 2022-03-31 MED ORDER — 0.9 % SODIUM CHLORIDE (POUR BTL) OPTIME
TOPICAL | Status: DC | PRN
  Administered 2022-03-31: 300 mL

## 2022-03-31 MED ORDER — DEXAMETHASONE SODIUM PHOSPHATE 10 MG/ML IJ SOLN
INTRAMUSCULAR | Status: DC | PRN
  Administered 2022-03-31: 4 mg via INTRAVENOUS

## 2022-03-31 MED ORDER — CEFAZOLIN SODIUM-DEXTROSE 2-4 GM/100ML-% IV SOLN
2.0000 g | INTRAVENOUS | Status: AC
  Administered 2022-03-31: 2 g via INTRAVENOUS

## 2022-03-31 MED ORDER — LACTATED RINGERS IV SOLN: INTRAVENOUS | Status: DC

## 2022-03-31 MED ORDER — LIDOCAINE HCL (CARDIAC) PF 100 MG/5ML IV SOSY
PREFILLED_SYRINGE | INTRAVENOUS | Status: DC | PRN
  Administered 2022-03-31: 30 mg via INTRAVENOUS

## 2022-03-31 MED ORDER — CEFAZOLIN SODIUM-DEXTROSE 2-4 GM/100ML-% IV SOLN
INTRAVENOUS | Status: AC
  Filled 2022-03-31: qty 100

## 2022-03-31 MED ORDER — ACETAMINOPHEN 500 MG PO TABS: 1000.0000 mg | ORAL_TABLET | Freq: Once | ORAL | Status: DC

## 2022-03-31 MED ORDER — MIDAZOLAM HCL 2 MG/2ML IJ SOLN
INTRAMUSCULAR | Status: AC
  Filled 2022-03-31: qty 2

## 2022-03-31 MED ORDER — SODIUM CHLORIDE 0.9 % IV SOLN
INTRAVENOUS | Status: AC | PRN
  Administered 2022-03-31: 50 mL

## 2022-03-31 MED ORDER — FENTANYL CITRATE (PF) 100 MCG/2ML IJ SOLN
INTRAMUSCULAR | Status: AC
  Filled 2022-03-31: qty 2

## 2022-03-31 MED ORDER — SENNA 8.6 MG PO TABS: 2.0000 | ORAL_TABLET | Freq: Two times a day (BID) | ORAL | 0 refills | Status: DC

## 2022-03-31 MED ORDER — PROPOFOL 500 MG/50ML IV EMUL
INTRAVENOUS | Status: DC | PRN
  Administered 2022-03-31: 150 ug/kg/min via INTRAVENOUS

## 2022-03-31 MED ORDER — ONDANSETRON HCL 4 MG/2ML IJ SOLN
INTRAMUSCULAR | Status: DC | PRN
  Administered 2022-03-31: 4 mg via INTRAVENOUS

## 2022-03-31 MED ORDER — DOCUSATE SODIUM 100 MG PO CAPS: 100.0000 mg | ORAL_CAPSULE | Freq: Two times a day (BID) | ORAL | 0 refills | Status: DC

## 2022-03-31 MED ORDER — AMISULPRIDE (ANTIEMETIC) 5 MG/2ML IV SOLN: 10.0000 mg | Freq: Once | INTRAVENOUS | Status: DC | PRN

## 2022-03-31 MED ORDER — FENTANYL CITRATE (PF) 100 MCG/2ML IJ SOLN: 25.0000 ug | INTRAMUSCULAR | Status: DC | PRN

## 2022-03-31 MED ORDER — SODIUM CHLORIDE 0.9 % IV SOLN: INTRAVENOUS | Status: DC

## 2022-03-31 MED ORDER — ONDANSETRON HCL 4 MG PO TABS: 4.0000 mg | ORAL_TABLET | Freq: Every day | ORAL | 1 refills | Status: DC | PRN

## 2022-03-31 MED ORDER — PROPOFOL 10 MG/ML IV BOLUS
INTRAVENOUS | Status: DC | PRN
  Administered 2022-03-31: 100 mg via INTRAVENOUS

## 2022-03-31 MED ORDER — OXYCODONE HCL 5 MG PO TABS: 5.0000 mg | ORAL_TABLET | Freq: Four times a day (QID) | ORAL | 0 refills | Status: AC | PRN

## 2022-03-31 SURGICAL SUPPLY — 106 items
APL PRP STRL LF DISP 70% ISPRP (MISCELLANEOUS) ×1
BANDAGE ESMARK 6X9 LF (GAUZE/BANDAGES/DRESSINGS) IMPLANT
BIT DRILL 1.8 CANN MAX VPC (BIT) IMPLANT
BIT DRILL CAL 2.5 ST W/SLV (BIT) IMPLANT
BIT TREPHINE CORING 8 (BIT) IMPLANT
BLADE AVERAGE 25X9 (BLADE) IMPLANT
BLADE LONG MED 25X9 (BLADE) IMPLANT
BLADE MICRO SAGITTAL (BLADE) IMPLANT
BLADE MINI RND TIP GREEN BEAV (BLADE) IMPLANT
BLADE OSC/SAG .038X5.5 CUT EDG (BLADE) IMPLANT
BLADE SURG 15 STRL LF DISP TIS (BLADE) ×6 IMPLANT
BLADE SURG 15 STRL SS (BLADE) ×3
BNDG CMPR 5X62 HK CLSR LF (GAUZE/BANDAGES/DRESSINGS) ×1
BNDG CMPR 6"X 5 YARDS HK CLSR (GAUZE/BANDAGES/DRESSINGS) ×1
BNDG CMPR 9X6 STRL LF SNTH (GAUZE/BANDAGES/DRESSINGS)
BNDG ELASTIC 4X5.8 VLCR STR LF (GAUZE/BANDAGES/DRESSINGS) ×2 IMPLANT
BNDG ELASTIC 6INX 5YD STR LF (GAUZE/BANDAGES/DRESSINGS) ×2 IMPLANT
BNDG ESMARK 6X9 LF (GAUZE/BANDAGES/DRESSINGS)
BNDG GZE 12X3 1 PLY HI ABS (GAUZE/BANDAGES/DRESSINGS) ×1
BNDG STRETCH GAUZE 3IN X12FT (GAUZE/BANDAGES/DRESSINGS) ×2 IMPLANT
BUR MIS CONICAL WEDGE 4.3X13 (BUR) ×2 IMPLANT
BUR MIS STRT 2.2X12 F/DRLSAW (BURR) IMPLANT
BURR CONICAL 4.3 (BUR)
BURR MIS CONICAL WEDGE 4.3X13 (BUR)
BURR MIS STRT 2.2X12 F/DRLSAW (BURR) ×1
BURR STRAIGHT 2.2X12 (BURR) ×1
CHLORAPREP W/TINT 26 (MISCELLANEOUS) ×2 IMPLANT
COVER BACK TABLE 60X90IN (DRAPES) ×2 IMPLANT
CUFF TOURN SGL QUICK 24 (TOURNIQUET CUFF) ×1
CUFF TOURN SGL QUICK 34 (TOURNIQUET CUFF)
CUFF TRNQT CYL 24X4X16.5-23 (TOURNIQUET CUFF) IMPLANT
CUFF TRNQT CYL 34X4.125X (TOURNIQUET CUFF) IMPLANT
DRAPE EXTREMITY T 121X128X90 (DISPOSABLE) ×2 IMPLANT
DRAPE OEC MINIVIEW 54X84 (DRAPES) ×2 IMPLANT
DRAPE U-SHAPE 47X51 STRL (DRAPES) ×2 IMPLANT
DRSG MEPITEL 4X7.2 (GAUZE/BANDAGES/DRESSINGS) ×2 IMPLANT
ELECT REM PT RETURN 9FT ADLT (ELECTROSURGICAL) ×1
ELECTRODE REM PT RTRN 9FT ADLT (ELECTROSURGICAL) ×2 IMPLANT
GAUZE PAD ABD 8X10 STRL (GAUZE/BANDAGES/DRESSINGS) ×4 IMPLANT
GAUZE SPONGE 4X4 12PLY STRL (GAUZE/BANDAGES/DRESSINGS) ×2 IMPLANT
GLOVE BIO SURGEON STRL SZ8 (GLOVE) ×2 IMPLANT
GLOVE BIOGEL PI IND STRL 7.0 (GLOVE) IMPLANT
GLOVE BIOGEL PI IND STRL 8 (GLOVE) ×4 IMPLANT
GLOVE ECLIPSE 8.0 STRL XLNG CF (GLOVE) ×2 IMPLANT
GLOVE SURG SS PI 7.0 STRL IVOR (GLOVE) IMPLANT
GOWN STRL REUS W/ TWL LRG LVL3 (GOWN DISPOSABLE) ×2 IMPLANT
GOWN STRL REUS W/ TWL XL LVL3 (GOWN DISPOSABLE) ×4 IMPLANT
GOWN STRL REUS W/TWL LRG LVL3 (GOWN DISPOSABLE) ×1
GOWN STRL REUS W/TWL XL LVL3 (GOWN DISPOSABLE) ×2
K-WIRE ACE 1.6X6 (WIRE) ×1
K-WIRE COCR 0.9X95 (WIRE) ×1
K-WIRE DBL .054X4 NSTRL (WIRE)
KIT INSTRUMENT MPJ STRATUM (KITS) IMPLANT
KWIRE ACE 1.6X6 (WIRE) IMPLANT
KWIRE COCR 0.9X95 (WIRE) IMPLANT
KWIRE DBL .054X4 NSTRL (WIRE) IMPLANT
LOOP VASCLR MAXI BLUE 18IN ST (MISCELLANEOUS) IMPLANT
LOOP VASCULAR MAXI 18 BLUE (MISCELLANEOUS)
LOOP VASCULAR MINI 18 RED (MISCELLANEOUS)
LOOPS VASCLR MAXI BLUE 18IN ST (MISCELLANEOUS) IMPLANT
NDL HYPO 22X1.5 SAFETY MO (MISCELLANEOUS) ×2 IMPLANT
NDL HYPO 25X1 1.5 SAFETY (NEEDLE) IMPLANT
NDL SAFETY ECLIP 18X1.5 (MISCELLANEOUS) IMPLANT
NEEDLE HYPO 22X1.5 SAFETY MO (MISCELLANEOUS) ×1 IMPLANT
NEEDLE HYPO 25X1 1.5 SAFETY (NEEDLE) IMPLANT
NEEDLE SAFETY HYPO 22GAX1.5 (MISCELLANEOUS) ×1
NS IRRIG 1000ML POUR BTL (IV SOLUTION) ×2 IMPLANT
PACK BASIN DAY SURGERY FS (CUSTOM PROCEDURE TRAY) ×2 IMPLANT
PAD CAST 4YDX4 CTTN HI CHSV (CAST SUPPLIES) ×2 IMPLANT
PADDING CAST ABS COTTON 4X4 ST (CAST SUPPLIES) IMPLANT
PADDING CAST COTTON 4X4 STRL (CAST SUPPLIES) ×1
PADDING CAST COTTON 6X4 STRL (CAST SUPPLIES) ×2 IMPLANT
PENCIL SMOKE EVACUATOR (MISCELLANEOUS) ×2 IMPLANT
PLATE MPJ 1ST STRM SM 7D LT (Plate) IMPLANT
SANITIZER HAND PURELL FF 515ML (MISCELLANEOUS) ×2 IMPLANT
SCREW COMP HEADLESS 2.5X18 (Screw) IMPLANT
SCREW LOCK STRATUM 3.5X16 (Screw) IMPLANT
SCREW LOCK STRATUM 3.5X18 (Screw) IMPLANT
SCREW MAX VPC  2.5X20 (Screw) ×1 IMPLANT
SCREW MAX VPC 2.5X20 (Screw) IMPLANT
SCREW NL LP STRATUM 3.5X16 (Screw) IMPLANT
SCREW STRM LOCK 3.5X14 (Screw) IMPLANT
SCREW VPC 2.5X16MM (Screw) IMPLANT
SET IRRIGATION TUBING (TUBING) IMPLANT
SHEET MEDIUM DRAPE 40X70 STRL (DRAPES) ×2 IMPLANT
SLEEVE SCD COMPRESS KNEE MED (STOCKING) ×2 IMPLANT
SPLINT PLASTER CAST FAST 5X30 (CAST SUPPLIES) ×40 IMPLANT
SPONGE SURGIFOAM ABS GEL 12-7 (HEMOSTASIS) IMPLANT
SPONGE T-LAP 18X18 ~~LOC~~+RFID (SPONGE) ×2 IMPLANT
STOCKINETTE 6  STRL (DRAPES) ×1
STOCKINETTE 6 STRL (DRAPES) ×2 IMPLANT
SUCTION FRAZIER HANDLE 10FR (MISCELLANEOUS) ×1
SUCTION TUBE FRAZIER 10FR DISP (MISCELLANEOUS) ×2 IMPLANT
SUT ETHILON 3 0 PS 1 (SUTURE) ×2 IMPLANT
SUT MNCRL AB 3-0 PS2 18 (SUTURE) ×2 IMPLANT
SUT VIC AB 2-0 SH 27 (SUTURE) ×1
SUT VIC AB 2-0 SH 27XBRD (SUTURE) ×2 IMPLANT
SUT VICRYL 0 SH 27 (SUTURE) IMPLANT
SUT VICRYL 0 UR6 27IN ABS (SUTURE) IMPLANT
SYR BULB EAR ULCER 3OZ GRN STR (SYRINGE) ×2 IMPLANT
SYR CONTROL 10ML LL (SYRINGE) IMPLANT
TOWEL GREEN STERILE FF (TOWEL DISPOSABLE) ×4 IMPLANT
TUBE CONNECTING 20X1/4 (TUBING) ×2 IMPLANT
UNDERPAD 30X36 HEAVY ABSORB (UNDERPADS AND DIAPERS) ×2 IMPLANT
VASCULAR TIE MAXI BLUE 18IN ST (MISCELLANEOUS)
VASCULAR TIE MINI RED 18IN STL (MISCELLANEOUS) IMPLANT

## 2022-03-31 NOTE — Progress Notes (Signed)
Assisted Dr. Lissa Hoard with left, adductor canal, popliteal, ultrasound guided block. Side rails up, monitors on throughout procedure. See vital signs in flow sheet. Tolerated Procedure well.

## 2022-03-31 NOTE — Anesthesia Postprocedure Evaluation (Signed)
Anesthesia Post Note  Patient: Crystal Lloyd  Procedure(s) Performed: ARTHRODESIS METATARSALPHALANGEAL JOINT (MTPJ) (Left: Foot) 2-5 HAMERTOE CORRECTIONS, FIFTH METATARSAL BUNIONETTE EXCISION AND FIFTH METATARSAL OSTEOTOMY (Left: Foot)     Patient location during evaluation: PACU Anesthesia Type: General Level of consciousness: sedated and patient cooperative Pain management: pain level controlled Vital Signs Assessment: post-procedure vital signs reviewed and stable Respiratory status: spontaneous breathing Cardiovascular status: stable Anesthetic complications: no   No notable events documented.  Last Vitals:  Vitals:   03/31/22 1345 03/31/22 1400  BP: 126/84 114/71  Pulse: (!) 57 62  Resp: 18 13  Temp:    SpO2: 100% 93%    Last Pain:  Vitals:   03/31/22 1345  TempSrc:   PainSc: 0-No pain                 Nolon Nations

## 2022-03-31 NOTE — Discharge Instructions (Addendum)
Crystal Simmer, MD [facility]  Please read the following information regarding your care after surgery.  Medications  You only need a prescription for the narcotic pain medicine (ex. oxycodone, Percocet, Norco).  All of the other medicines listed below are available over the counter. ? Aleve 2 pills twice a day for the first 3 days after surgery. ? acetominophen (Tylenol) 650 mg every 4-6 hours as you need for minor to moderate pain ? oxycodone as prescribed for severe pain  Narcotic pain medicine (ex. oxycodone, Percocet, Vicodin) will cause constipation.  To prevent this problem, take the following medicines while you are taking any pain medicine. ? docusate sodium (Colace) 100 mg twice a day ? senna (Senokot) 2 tablets twice a day  Weight Bearing ? Bear weight only on your operated foot in the CAM boot.   Cast / Splint / Dressing ? Keep your splint, cast or dressing clean and dry.  Don't put anything (coat hanger, pencil, etc) down inside of it.  If it gets damp, use a hair dryer on the cool setting to dry it.  If it gets soaked, call the office to schedule an appointment for a cast change.   After your dressing, cast or splint is removed; you may shower, but do not soak or scrub the wound.  Allow the water to run over it, and then gently pat it dry.  Swelling It is normal for you to have swelling where you had surgery.  To reduce swelling and pain, keep your toes above your nose for at least 3 days after surgery.  It may be necessary to keep your foot or leg elevated for several weeks.  If it hurts, it should be elevated.  Follow Up Call my office at [phone] when you are discharged from the hospital or surgery center to schedule an appointment to be seen two weeks after surgery.  Call my office at [phone] if you develop a fever >101.5 F, nausea, vomiting, bleeding from the surgical site or severe pain.        Post Anesthesia Home Care Instructions  Activity: Get  plenty of rest for the remainder of the day. A responsible individual must stay with you for 24 hours following the procedure.  For the next 24 hours, DO NOT: -Drive a car -Paediatric nurse -Drink alcoholic beverages -Take any medication unless instructed by your physician -Make any legal decisions or sign important papers.  Meals: Start with liquid foods such as gelatin or soup. Progress to regular foods as tolerated. Avoid greasy, spicy, heavy foods. If nausea and/or vomiting occur, drink only clear liquids until the nausea and/or vomiting subsides. Call your physician if vomiting continues.  Special Instructions/Symptoms: Your throat may feel dry or sore from the anesthesia or the breathing tube placed in your throat during surgery. If this causes discomfort, gargle with warm salt water. The discomfort should disappear within 24 hours.  If you had a scopolamine patch placed behind your ear for the management of post- operative nausea and/or vomiting:  1. The medication in the patch is effective for 72 hours, after which it should be removed.  Wrap patch in a tissue and discard in the trash. Wash hands thoroughly with soap and water. 2. You may remove the patch earlier than 72 hours if you experience unpleasant side effects which may include dry mouth, dizziness or visual disturbances. 3. Avoid touching the patch. Wash your hands with soap and water after contact with the patch.

## 2022-03-31 NOTE — Op Note (Signed)
03/31/2022  1:34 PM  PATIENT:  Crystal Lloyd  77 y.o. female  PRE-OPERATIVE DIAGNOSIS: 1.  Painful left foot bunion deformity with hallux rigidus 2.  Left second through fifth hammertoe deformities 3.  Left tailor's bunion  POST-OPERATIVE DIAGNOSIS: Same  Procedure(s): 1.  Left hallux MP joint arthrodesis 2.  Left second through fifth flexor digitorum longus tenotomies 3.  Left second through fifth hammertoe corrections 4.  Left fifth metatarsal osteotomy 5.  Excision of left fifth metatarsal tailor's bunion 6.  Left second toe extensor digitorum longus tenotomy 7.  Left foot AP, lateral and oblique radiographs  SURGEON:  Wylene Simmer, MD  ASSISTANT: None  ANESTHESIA:   General, regional  EBL:  minimal   TOURNIQUET:   Total Tourniquet Time Documented: Thigh (Left) - 82 minutes Total: Thigh (Left) - 82 minutes  COMPLICATIONS:  None apparent  DISPOSITION:  Extubated, awake and stable to recovery.  INDICATION FOR PROCEDURE: 77 year old female with a past medical history significant for coronary artery disease has a long history of left forefoot pain due to a prominent bunion deformity as well as a tailor's bunion and second through fifth hammertoe deformities.  She has degenerative changes at the hallux MP joint as well.  She has failed nonoperative treatment and presents today for surgical reconstruction of her left forefoot.  The risks and benefits of the alternative treatment options have been discussed in detail.  The patient wishes to proceed with surgery and specifically understands risks of bleeding, infection, nerve damage, blood clots, need for additional surgery, amputation and death.   PROCEDURE IN DETAIL: After preoperative consent was obtained and the correct operative site was identified, the patient was brought to the operating room and placed supine on the operating table.  General anesthesia was induced.  Preoperative antibiotics were administered.  Surgical  timeout was taken.  The left lower extremity was prepped and draped in standard sterile fashion with a tourniquet around the thigh.  The extremity was elevated, and the tourniquet was inflated to 250 mmHg.  A longitudinal incision was made over the hallux MP joint.  Dissection was carried sharply down through the subcutaneous tissues.  The extensor tendons were protected.  The dorsal joint capsule was incised and elevated medially and laterally.  The collateral ligaments were released exposing the metatarsal head.  A concave reamer was used to remove the remaining articular cartilage and subchondral bone.  A convex reamer was used to prepare the base of the proximal phalanx in the same fashion.  The joint was reduced and provisionally pinned.  Radiographs confirmed appropriate reduction of the hallux MP joint and appropriate correction of the bunion deformity.  A size small 7 degree stratum plate was selected.  The template was placed over the joint and provisionally pinned.  The template was removed after confirming appropriate position of the radiograph.  The plate was then inserted and impacted into position at the proximal phalanx.  It was further secured with 2 bicortical locking screws.  The compression slot was then drilled proximally and a compression pin inserted.  The provisional pin was removed and the compression knot tightened securely.  The toe is appropriately positioned.  The plate was secured to the first metatarsal with 3 locking bicortical screws.  The guidepin was then removed and replaced with a nonlocking bicortical screw.  AP and lateral radiographs confirmed appropriate correction of the bunion deformity and arthrodesis of the hallux MP joint.  Hardware is appropriately positioned and of the appropriate lengths.  The wound was then irrigated copiously and sprinkled with vancomycin powder.  Subcutaneous tissues were approximated with Monocryl.  The skin incision was closed with  nylon.  Attention was turned to the second toe where the flexor digitorum longus tendon was released from the distal phalanx through a plantar stab incision.  The same procedure was then performed for the third, fourth and fifth toes.  A 2 x 12 mm Shannon bur was then inserted into the PIP joint of the fifth toe from a lateral approach.  The bur was used to resect both joint surfaces.  Bone paste was expressed from the lateral incision.  A guidepin was then inserted from the tip of the toe across the IP joints.  Radiographs confirmed appropriate position of the guidepin.  The guidepin was overdrilled and a 2.5 mm cannulated [employer] VPC screw was inserted.  It was noted to have adequate purchase  Radiographs confirmed appropriate seating of the screw and appropriate correction of the hammertoe deformity.  The same procedure was then performed for the fourth, third and second toes.  At this point the extensor tendon to the second toe was noted to be contracted forcing the toe into dorsiflexion.  The tip of a Beaver blade was inserted and was used to release the extensor tendon percutaneously.  Attention was then turned to the fifth ray.  The patient was noted to have an increased 4-5 intermetatarsal angle.  The decision was made to proceed with an osteotomy to correct the angular deformity.  A small incision was made adjacent to the metatarsal shaft.  Periosteal elevator was then passed dorsally and plantarly to the metatarsal distally.  The bur was used to make an oblique osteotomy from proximal plantar to distal dorsal.  The head of the metatarsal could easily be translated laterally.  The hypertrophic lateral eminence was then resected with the bur.  The metatarsal head was translated adjacent to the fourth metatarsal.  A 1.6 mm K wire was inserted from the lateral forefoot distally retrograde up the fifth metatarsal shaft to engage the base.  Radiographs confirmed appropriate position of the guidepin.   It was then bent, trimmed and capped.  Final AP, lateral and oblique radiographs showed appropriate position and length of all hardware and appropriate correction of the bunion, tailor's bunion and hammertoe deformities.  The wounds were irrigated copiously and closed with nylon sutures.  Sterile dressings were applied followed by a well-padded cam boot.  The tourniquet was released after application of the dressings.  The patient was awakened from anesthesia and transported to the recovery room in stable condition.   FOLLOW UP PLAN: Weightbearing as tolerated on the heel in the cam boot.  No indication for DVT prophylaxis in this ambulatory patient.  Follow-up in the office in 2 weeks for suture removal.  Plan postoperative immobilization in the cam boot for 6 weeks.  Plan to pull the pin 6 weeks postop as well.   RADIOGRAPHS:AP, lateral and oblique radiographs of the left foot are obtained intraoperatively.  These show show appropriate position and length of all hardware and appropriate correction of the bunion, tailor's bunion and hammertoe deformities.  Hardware is appropriately positioned and of the appropriate lengths.  No other acute injuries are noted.

## 2022-03-31 NOTE — Anesthesia Procedure Notes (Signed)
Procedure Name: LMA Insertion Date/Time: 03/31/2022 11:34 AM  Performed by: Mahala Rommel, Ernesta Amble, CRNAPre-anesthesia Checklist: Patient identified, Emergency Drugs available, Suction available and Patient being monitored Patient Re-evaluated:Patient Re-evaluated prior to induction Oxygen Delivery Method: Circle system utilized Preoxygenation: Pre-oxygenation with 100% oxygen Induction Type: IV induction Ventilation: Mask ventilation without difficulty LMA: LMA inserted LMA Size: 4.0 Number of attempts: 1 Airway Equipment and Method: Bite block Placement Confirmation: positive ETCO2 Tube secured with: Tape Dental Injury: Teeth and Oropharynx as per pre-operative assessment

## 2022-03-31 NOTE — Anesthesia Preprocedure Evaluation (Addendum)
Anesthesia Evaluation  Patient identified by MRN, date of birth, ID band Patient awake    Reviewed: Allergy & Precautions, NPO status , Patient's Chart, lab work & pertinent test results  History of Anesthesia Complications (+) PONV and history of anesthetic complications  Airway Mallampati: I  TM Distance: >3 FB Neck ROM: Full    Dental no notable dental hx. (+) Dental Advisory Given, Teeth Intact   Pulmonary asthma    Pulmonary exam normal breath sounds clear to auscultation       Cardiovascular + CAD and + Past MI  Normal cardiovascular exam Rhythm:Regular Rate:Normal  Echo 05/2018  1. The left ventricle has normal systolic function with an ejection fraction of 60-65%. The cavity size was normal. Left ventricular diastolic Doppler parameters are consistent with impaired relaxation.   2. The LV apical contractility and the LV function in general have improved since the previous echo Mar 07, 2018.   3. The right ventricle has normal systolic function. The cavity was normal. There is no increase in right ventricular wall thickness.   4. The aortic root is normal in size and structure.   5. The average left ventricular global longitudinal strain is -24.5 %.   6. The interatrial septum was not assessed.     Neuro/Psych negative neurological ROS     GI/Hepatic negative GI ROS, Neg liver ROS,,,  Endo/Other  Hypothyroidism    Renal/GU Renal disease (RIGHT RENAL PELVIC STONE)     Musculoskeletal negative musculoskeletal ROS (+)    Abdominal  (+) - obese  Peds  Hematology negative hematology ROS (+)   Anesthesia Other Findings Day of surgery medications reviewed with the patient.  Breast cancer s/p  bilateral mastectomies, chemo and radiation therapy  Reproductive/Obstetrics                             Anesthesia Physical Anesthesia Plan  ASA: 3  Anesthesia Plan: General   Post-op Pain  Management: Tylenol PO (pre-op)* and Regional block*   Induction: Intravenous  PONV Risk Score and Plan: 4 or greater and TIVA, Dexamethasone, Ondansetron and Treatment may vary due to age or medical condition  Airway Management Planned: LMA  Additional Equipment:   Intra-op Plan:   Post-operative Plan: Extubation in OR  Informed Consent: I have reviewed the patients History and Physical, chart, labs and discussed the procedure including the risks, benefits and alternatives for the proposed anesthesia with the patient or authorized representative who has indicated his/her understanding and acceptance.     Dental advisory given  Plan Discussed with: CRNA  Anesthesia Plan Comments:         Anesthesia Quick Evaluation
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
## 2022-03-31 NOTE — Transfer of Care (Signed)
Immediate Anesthesia Transfer of Care Note  Patient: Crystal Lloyd  Procedure(s) Performed: ARTHRODESIS METATARSALPHALANGEAL JOINT (MTPJ) (Left: Foot) 2-5 HAMERTOE CORRECTIONS, FIFTH METATARSAL BUNIONETTE EXCISION AND POSSIBLE FIFTH METATARSAL OSTEOTOMY (Left: Foot)  Patient Location: PACU  Anesthesia Type:GA combined with regional for post-op pain  Level of Consciousness: drowsy and patient cooperative  Airway & Oxygen Therapy: Patient Spontanous Breathing and Patient connected to face mask oxygen  Post-op Assessment: Report given to RN and Post -op Vital signs reviewed and stable  Post vital signs: Reviewed and stable  Last Vitals:  Vitals Value Taken Time  BP 124/75 03/31/22 1322  Temp    Pulse 61 03/31/22 1323  Resp 19 03/31/22 1323  SpO2 100 % 03/31/22 1323  Vitals shown include unvalidated device data.  Last Pain:  Vitals:   03/31/22 0930  TempSrc: Oral  PainSc: 0-No pain      Patients Stated Pain Goal: 3 ([id number])  Complications: No notable events documented.

## 2022-04-01 ENCOUNTER — Encounter (HOSPITAL_BASED_OUTPATIENT_CLINIC_OR_DEPARTMENT_OTHER): Payer: Self-pay | Admitting: Orthopedic Surgery

## 2022-04-07 ENCOUNTER — Other Ambulatory Visit (HOSPITAL_COMMUNITY): Payer: Self-pay

## 2022-04-07 ENCOUNTER — Telehealth: Payer: Self-pay | Admitting: Surgical

## 2022-04-07 MED ORDER — LIDOCAINE 23% - TETRACAINE 7% TOPICAL OINTMENT (PLASTICIZED)
1.0000 | TOPICAL_OINTMENT | Freq: Once | CUTANEOUS | 0 refills | Status: AC
  Filled 2022-04-07: qty 60, 1d supply, fill #0

## 2022-04-07 NOTE — Telephone Encounter (Signed)
Please call in numbing cream for pt appt: [facility] on March 19th at 2:00pm

## 2022-04-07 NOTE — Telephone Encounter (Signed)
Rx sent to [facility]

## 2022-04-08 ENCOUNTER — Other Ambulatory Visit (HOSPITAL_COMMUNITY): Payer: Self-pay

## 2022-04-11 ENCOUNTER — Other Ambulatory Visit (HOSPITAL_COMMUNITY): Payer: Self-pay

## 2022-04-12 ENCOUNTER — Other Ambulatory Visit: Payer: Medicare Other | Admitting: Surgical

## 2022-04-16 ENCOUNTER — Encounter: Payer: Self-pay | Admitting: Plastic Surgery

## 2022-04-16 ENCOUNTER — Ambulatory Visit (INDEPENDENT_AMBULATORY_CARE_PROVIDER_SITE_OTHER): Payer: Self-pay | Admitting: Plastic Surgery

## 2022-04-16 DIAGNOSIS — Z719 Counseling, unspecified: Secondary | ICD-10-CM

## 2022-04-16 NOTE — Progress Notes (Signed)
[facility] Treatment   Treatment  Settings:       In media      Topical and/or Block: lidocaine/tetracaine cream  Post Care: given  Notes: sente ultra nourishing and fan given

## 2022-04-19 ENCOUNTER — Telehealth: Payer: Self-pay | Admitting: *Deleted

## 2022-04-19 ENCOUNTER — Ambulatory Visit: Payer: Medicare Other | Admitting: *Deleted

## 2022-04-19 VITALS — Ht 60.0 in | Wt 117.0 lb

## 2022-04-19 DIAGNOSIS — Z1211 Encounter for screening for malignant neoplasm of colon: Secondary | ICD-10-CM

## 2022-04-19 NOTE — Telephone Encounter (Signed)
MD notified of recent surgery and request clarification if procedure is OK

## 2022-04-19 NOTE — Progress Notes (Signed)
Pt's previsit is done over the phone and all paperwork (prep instructions) sent to patient. Pt's name and DOB verified at the beginning of the previsit. Pt denies any difficulty with ambulating.  No egg or soy allergy known to patient  No issues known to pt with past sedation with any surgeries or procedures Patient denies ever being intubated No FH of Malignant Hyperthermia Pt is not on diet pills Pt is not on  home 02  Pt is not on blood thinners  Pt denies issues with constipation uses Miralax Pt is not on dialysis Pt denies any upcoming cardiac testing Pt encouraged to use to use Singlecare or Goodrx to reduce cost  Patient's chart reviewed by Osvaldo Angst CNRA prior to previsit and patient appropriate for the [place].  Previsit completed and red dot placed by patient's name on their procedure day (on provider's schedule).  . Visit by phone Pt states her weight is  Instructions reviewed with pt and pt states understanding. Instructed to review again prior to procedure. Pt states they will.  Instructions sent by mail with coupon and by my chart

## 2022-05-06 ENCOUNTER — Telehealth: Payer: Self-pay | Admitting: Gastroenterology

## 2022-05-06 DIAGNOSIS — Z1211 Encounter for screening for malignant neoplasm of colon: Secondary | ICD-10-CM

## 2022-05-06 MED ORDER — NA SULFATE-K SULFATE-MG SULF 17.5-3.13-1.6 GM/177ML PO SOLN: 1.0000 | Freq: Once | ORAL | 0 refills | Status: AC

## 2022-05-06 NOTE — Telephone Encounter (Signed)
Patient called said she still has not been able to pick up her Suprep and is really concerned procedure is scheduled for 05/10/22, wanted to pick it up by today. Requested a call to let her know.

## 2022-05-06 NOTE — Telephone Encounter (Signed)
Verified pharmacy to send prep to  suprep sent to [facility] on [address] per request,instructed pt. To call with any other concerns.

## 2022-05-10 ENCOUNTER — Encounter: Payer: Self-pay | Admitting: Gastroenterology

## 2022-05-10 ENCOUNTER — Ambulatory Visit (AMBULATORY_SURGERY_CENTER): Payer: Medicare Other | Admitting: Gastroenterology

## 2022-05-10 VITALS — BP 121/58 | HR 68 | Temp 97.1°F | Resp 18 | Ht 60.0 in | Wt 117.0 lb

## 2022-05-10 DIAGNOSIS — D123 Benign neoplasm of transverse colon: Secondary | ICD-10-CM | POA: Diagnosis not present

## 2022-05-10 DIAGNOSIS — Z09 Encounter for follow-up examination after completed treatment for conditions other than malignant neoplasm: Secondary | ICD-10-CM

## 2022-05-10 DIAGNOSIS — Z1211 Encounter for screening for malignant neoplasm of colon: Secondary | ICD-10-CM | POA: Diagnosis not present

## 2022-05-10 DIAGNOSIS — Z86010 Personal history of colonic polyps: Secondary | ICD-10-CM | POA: Diagnosis not present

## 2022-05-10 DIAGNOSIS — K529 Noninfective gastroenteritis and colitis, unspecified: Secondary | ICD-10-CM

## 2022-05-10 MED ORDER — SODIUM CHLORIDE 0.9 % IV SOLN: 500.0000 mL | Freq: Once | INTRAVENOUS | Status: DC

## 2022-05-10 NOTE — Op Note (Signed)
[facility] Patient Name: Crystal QuintelaProcedure Date: 05/10/2022 10:57 AM MRN: [id number] Endoscopist: Tressia Danas MD, MD, [id number] Age: 77 Referring MD:  Date of Birth: Apr 15, 1945 Gender: Female Account #: [id number] Procedure:                Colonoscopy Indications:              Surveillance: Personal history of colonic polyps                            (unknown histology) on last colonoscopy 5 years ago                           At least 4 prior colonoscopies, last with Dr.                            Troy Sine in 2019                           History of tubular adenoma on prior colonoscopy Medicines:                Monitored Anesthesia Care Procedure:                Pre-Anesthesia Assessment:                           - Prior to the procedure, a History and Physical                            was performed, and patient medications and                            allergies were reviewed. The patient's tolerance of                            previous anesthesia was also reviewed. The risks                            and benefits of the procedure and the sedation                            options and risks were discussed with the patient.                            All questions were answered, and informed consent                            was obtained. Prior Anticoagulants: The patient has                            taken no anticoagulant or antiplatelet agents. ASA                            Grade Assessment: II - A patient with mild systemic  disease. After reviewing the risks and benefits,                            the patient was deemed in satisfactory condition to                            undergo the procedure.                           After obtaining informed consent, the colonoscope                            was passed under direct vision. Throughout the                            procedure, the patient's blood pressure,  pulse, and                            oxygen saturations were monitored continuously. The                            CF [id number] #[id number] was introduced through the anus                            and advanced to the the cecum, identified by                            appendiceal orifice and ileocecal valve. A second                            forward view of the right colon was performed. The                            colonoscopy was performed without difficulty. The                            patient tolerated the procedure well. The quality                            of the bowel preparation was good. The ileocecal                            valve, appendiceal orifice, and rectum were                            photographed. Scope In: 11:17:11 AM Scope Out: 11:30:50 AM Scope Withdrawal Time: 0 hours 11 minutes 38 seconds  Total Procedure Duration: 0 hours 13 minutes 39 seconds  Findings:                 Non-bleeding internal hemorrhoids were found.                           Multiple large-mouthed, medium-mouthed and  small-mouthed diverticula were found in the entire                            colon.                           A localized area of mildly erythematous mucosa was                            found in the distal transverse colon. Biopsies were                            taken with a cold forceps for histology. Estimated                            blood loss was minimal.                           A 3 mm polyp was found in the hepatic flexure. The                            polyp was sessile. The polyp was removed with a                            cold snare. Resection and retrieval were complete.                            Estimated blood loss was minimal.                           There was evidence of a prior end-to-end                            colo-colonic anastomosis in the recto-sigmoid                            colon. This was patent and was  characterized by                            healthy appearing mucosa.                           The exam was otherwise without abnormality on                            direct and retroflexion views. Complications:            No immediate complications. Estimated Blood Loss:     Estimated blood loss was minimal. Impression:               - Non-bleeding internal hemorrhoids.                           - Diverticulosis in the entire examined colon.                           -  Erythematous mucosa in the distal transverse                            colon. Biopsied.                           - One 3 mm polyp at the hepatic flexure, removed                            with a cold snare. Resected and retrieved.                           - The examination was otherwise normal on direct                            and retroflexion views. Recommendation:           - Patient has a contact number available for                            emergencies. The signs and symptoms of potential                            delayed complications were discussed with the                            patient. Return to normal activities tomorrow.                            Written discharge instructions were provided to the                            patient.                           - High fiber diet. Continue to use a daily stool                            bulking agent.                           - Continue present medications.                           - Await pathology results.                           - Repeat colonoscopy in 7 years for surveillance if                            clinically appropriate at that time.                           - Emerging evidence supports eating a diet of  fruits, vegetables, grains, calcium, and yogurt                            while reducing red meat and alcohol may reduce the                            risk of colon cancer.                           -  Thank you for allowing me to be involved in your                            colon cancer prevention. Tressia Danas MD, MD 05/10/2022 11:40:08 AM This report has been signed electronically.

## 2022-05-10 NOTE — Progress Notes (Signed)
Report to PACU, RN, vss, BBS= Clear.  

## 2022-05-10 NOTE — Progress Notes (Signed)
VS by DT  Pt's states no medical or surgical changes since previsit or office visit.  
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
## 2022-05-10 NOTE — Progress Notes (Signed)
Patient states "irritated r eye.  Draining clear liquid.  States that it "doesn't hurt." But concerned about a "corneal abrasion."  Eye is red and irritated. Suggested to call eye doctor if it persists.

## 2022-05-10 NOTE — Patient Instructions (Addendum)
Resume your current medications today. Repeat colonoscopy in 7 yrs if able.  YOU HAD AN ENDOSCOPIC PROCEDURE TODAY AT THE [facility]:   Refer to the procedure report that was given to you for any specific questions about what was found during the examination.  If the procedure report does not answer your questions, please call your gastroenterologist to clarify.  If you requested that your care partner not be given the details of your procedure findings, then the procedure report has been included in a sealed envelope for you to review at your convenience later.  YOU SHOULD EXPECT: Some feelings of bloating in the abdomen. Passage of more gas than usual.  Walking can help get rid of the air that was put into your GI tract during the procedure and reduce the bloating. If you had a lower endoscopy (such as a colonoscopy or flexible sigmoidoscopy) you may notice spotting of blood in your stool or on the toilet paper. If you underwent a bowel prep for your procedure, you may not have a normal bowel movement for a few days.  Please Note:  You might notice some irritation and congestion in your nose or some drainage.  This is from the oxygen used during your procedure.  There is no need for concern and it should clear up in a day or so.  SYMPTOMS TO REPORT IMMEDIATELY:  Following lower endoscopy (colonoscopy or flexible sigmoidoscopy):  Excessive amounts of blood in the stool  Significant tenderness or worsening of abdominal pains  Swelling of the abdomen that is new, acute  Fever of 100F or higher  For urgent or emergent issues, a gastroenterologist can be reached at any hour by calling [phone]. Do not use MyChart messaging for urgent concerns.    DIET:  We do recommend a small meal at first, but then you may proceed to your regular diet.  Drink plenty of fluids but you should avoid alcoholic beverages for 24 hours.  ACTIVITY:  You should plan to take it easy for the rest of  today and you should NOT DRIVE or use heavy machinery until tomorrow (because of the sedation medicines used during the test).    FOLLOW UP: Our staff will call the number listed on your records the next business day following your procedure.  We will call around 7:15- 8:00 am to check on you and address any questions or concerns that you may have regarding the information given to you following your procedure. If we do not reach you, we will leave a message.     If any biopsies were taken you will be contacted by phone or by letter within the next 1-3 weeks.  Please call us at [phone] if you have not heard about the biopsies in 3 weeks.    SIGNATURES/CONFIDENTIALITY: You and/or your care partner have signed paperwork which will be entered into your electronic medical record.  These signatures attest to the fact that that the information above on your After Visit Summary has been reviewed and is understood.  Full responsibility of the confidentiality of this discharge information lies with you and/or your care-partner.

## 2022-05-10 NOTE — Progress Notes (Signed)
Called to room to assist during endoscopic procedure.  Patient ID and intended procedure confirmed with present staff. Received instructions for my participation in the procedure from the performing physician.  

## 2022-05-11 ENCOUNTER — Telehealth: Payer: Self-pay | Admitting: *Deleted

## 2022-05-11 NOTE — Telephone Encounter (Signed)
  Follow up Call-    Row Labels 05/10/2022   10:20 AM  Call back number   Section Header. No data exists in this row.   Post procedure Call Back phone  #   [phone]  Permission to leave phone message   Yes     Patient questions:  Do you have a fever, pain , or abdominal swelling? No. Pain Score  0 *  Have you tolerated food without any problems? Yes.    Have you been able to return to your normal activities? Yes.    Do you have any questions about your discharge instructions: Diet   No. Medications  No. Follow up visit  No.  Do you have questions or concerns about your Care? No.  Actions: * If pain score is 4 or above: No action needed, pain <4.

## 2022-05-16 DIAGNOSIS — M7742 Metatarsalgia, left foot: Secondary | ICD-10-CM | POA: Diagnosis not present

## 2022-06-15 DIAGNOSIS — M7742 Metatarsalgia, left foot: Secondary | ICD-10-CM | POA: Diagnosis not present

## 2022-06-16 ENCOUNTER — Institutional Professional Consult (permissible substitution): Payer: Medicare Other | Admitting: Pulmonary Disease

## 2022-06-28 ENCOUNTER — Encounter: Payer: Self-pay | Admitting: Pulmonary Disease

## 2022-06-28 ENCOUNTER — Ambulatory Visit: Payer: Medicare Other | Admitting: Pulmonary Disease

## 2022-06-28 VITALS — BP 116/68 | HR 62 | Ht 60.0 in | Wt 115.0 lb

## 2022-06-28 DIAGNOSIS — J219 Acute bronchiolitis, unspecified: Secondary | ICD-10-CM
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
## 2022-06-28 NOTE — Patient Instructions (Addendum)
Your CT scan shows evidence of tree-in-bud infiltrates concerning for possible Mycobacterium avium complex involvement  We will monitor these findings with annual CT Chest scans  Follow up in January 2025 for pulmonary function tests and CT Chest scan

## 2022-07-18 DIAGNOSIS — M79672 Pain in left foot: Secondary | ICD-10-CM | POA: Diagnosis not present

## 2022-07-18 DIAGNOSIS — R269 Unspecified abnormalities of gait and mobility: Secondary | ICD-10-CM | POA: Diagnosis not present

## 2022-08-01 DIAGNOSIS — M7742 Metatarsalgia, left foot: Secondary | ICD-10-CM | POA: Diagnosis not present

## 2022-08-02 DIAGNOSIS — M79672 Pain in left foot: Secondary | ICD-10-CM | POA: Diagnosis not present

## 2022-08-02 DIAGNOSIS — R269 Unspecified abnormalities of gait and mobility: Secondary | ICD-10-CM | POA: Diagnosis not present

## 2022-08-17 ENCOUNTER — Ambulatory Visit: Payer: Medicare Other | Admitting: Internal Medicine

## 2022-08-17 ENCOUNTER — Encounter: Payer: Self-pay | Admitting: Internal Medicine

## 2022-08-17 VITALS — BP 110/70 | HR 80 | Ht 60.0 in | Wt 118.0 lb

## 2022-08-17 DIAGNOSIS — K5900 Constipation, unspecified: Secondary | ICD-10-CM

## 2022-08-17 DIAGNOSIS — D123 Benign neoplasm of transverse colon: Secondary | ICD-10-CM

## 2022-08-17 DIAGNOSIS — Z8719 Personal history of other diseases of the digestive system: Secondary | ICD-10-CM
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
## 2022-08-17 NOTE — Patient Instructions (Signed)
Please follow up as needed.  _______________________________________________________  If your blood pressure at your visit was 140/90 or greater, please contact your primary care physician to follow up on this.  _______________________________________________________  If you are age 75 or older, your body mass index should be between 23-30. Your Body mass index is 23.05 kg/m. If this is out of the aforementioned range listed, please consider follow up with your Primary Care Provider.  If you are age 62 or younger, your body mass index should be between 19-25. Your Body mass index is 23.05 kg/m. If this is out of the aformentioned range listed, please consider follow up with your Primary Care Provider.   ________________________________________________________  The  GI providers would like to encourage you to use [facility] to communicate with providers for non-urgent requests or questions.  Due to long hold times on the telephone, sending your provider a message by [facility] may be a faster and more efficient way to get a response.  Please allow 48 business hours for a response.  Please remember that this is for non-urgent requests.  _______________________________________________________

## 2022-09-01 DIAGNOSIS — R269 Unspecified abnormalities of gait and mobility: Secondary | ICD-10-CM | POA: Diagnosis not present

## 2022-09-01 DIAGNOSIS — M79672 Pain in left foot: Secondary | ICD-10-CM | POA: Diagnosis not present

## 2022-09-22 ENCOUNTER — Encounter: Payer: Self-pay | Admitting: Interventional Cardiology

## 2022-09-22 DIAGNOSIS — R931 Abnormal findings on diagnostic imaging of heart and coronary circulation: Secondary | ICD-10-CM

## 2022-09-22 DIAGNOSIS — I5181 Takotsubo syndrome: Secondary | ICD-10-CM

## 2022-09-22 DIAGNOSIS — R55 Syncope and collapse: Secondary | ICD-10-CM

## 2022-09-22 NOTE — Telephone Encounter (Signed)
I called Dr Yolanda Bonine and she is asking to see Dr Eldridge Dace soon.. she is due for her one year OV.. she has been experiencing "swallowing syncope" for quite some time and dealing with it in on her own and she is now getting worried since it happened recently after eating crackers. She has stopped eating in her car since she drives her grandchildren to school since she is recently retired.   I am unable to find an appt soon with Dr Eldridge Dace... I will forward to him for review.   I will watch his schedule to see if anyone cancels for this afternoon.

## 2022-09-23 ENCOUNTER — Ambulatory Visit: Payer: Medicare Other | Attending: Interventional Cardiology

## 2022-09-23 DIAGNOSIS — R55 Syncope and collapse: Secondary | ICD-10-CM

## 2022-09-23 NOTE — Telephone Encounter (Signed)
Reviewed with Dr Eldridge Dace and patient should have a regular non live zio

## 2022-09-23 NOTE — Telephone Encounter (Signed)
Corky Crafts, MD  to Bertram Millard, RN  Me  Laural Benes, KRISTIE L     09/22/22  5:37 PM I would check an echo and 14 day Zio patch to start with.  Would be most interesting to see what her HR dose during her meals.  May need EP referral depending on findings.  JV

## 2022-09-23 NOTE — Progress Notes (Unsigned)
Enrolled patient for a 14 day Zio XT  monitor to be mailed to patients home  °

## 2022-10-04 NOTE — Telephone Encounter (Signed)
Corky Crafts, MD  You; Loa Socks, LPN5 days ago    I would go with Dr. Jacques Navy or Flora Lipps.  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

## 2022-10-05 DIAGNOSIS — H43813 Vitreous degeneration, bilateral: Secondary | ICD-10-CM | POA: Diagnosis not present

## 2022-10-05 DIAGNOSIS — H04123 Dry eye syndrome of bilateral lacrimal glands: Secondary | ICD-10-CM | POA: Diagnosis not present

## 2022-10-05 DIAGNOSIS — Z961 Presence of intraocular lens: Secondary | ICD-10-CM | POA: Diagnosis not present

## 2022-10-05 DIAGNOSIS — H02421 Myogenic ptosis of right eyelid: Secondary | ICD-10-CM | POA: Diagnosis not present

## 2022-10-12 ENCOUNTER — Other Ambulatory Visit: Payer: Self-pay

## 2022-10-12 ENCOUNTER — Emergency Department (HOSPITAL_BASED_OUTPATIENT_CLINIC_OR_DEPARTMENT_OTHER)
Admission: EM | Admit: 2022-10-12 | Discharge: 2022-10-12 | Disposition: A | Discharge status: Home / Self Care | Payer: Medicare Other | Attending: Emergency Medicine | Admitting: Emergency Medicine

## 2022-10-12 ENCOUNTER — Encounter (HOSPITAL_BASED_OUTPATIENT_CLINIC_OR_DEPARTMENT_OTHER): Payer: Self-pay | Admitting: Emergency Medicine

## 2022-10-12 ENCOUNTER — Emergency Department (HOSPITAL_BASED_OUTPATIENT_CLINIC_OR_DEPARTMENT_OTHER): Payer: Medicare Other

## 2022-10-12 DIAGNOSIS — S060X0A Concussion without loss of consciousness, initial encounter: Secondary | ICD-10-CM | POA: Diagnosis not present

## 2022-10-12 DIAGNOSIS — W19XXXA Unspecified fall, initial encounter: Secondary | ICD-10-CM

## 2022-10-12 DIAGNOSIS — W01198A Fall on same level from slipping, tripping and stumbling with subsequent striking against other object, initial encounter: Secondary | ICD-10-CM | POA: Insufficient documentation

## 2022-10-12 DIAGNOSIS — I6782 Cerebral ischemia: Secondary | ICD-10-CM | POA: Diagnosis not present

## 2022-10-12 DIAGNOSIS — S199XXA Unspecified injury of neck, initial encounter: Secondary | ICD-10-CM | POA: Diagnosis not present

## 2022-10-12 DIAGNOSIS — S0990XA Unspecified injury of head, initial encounter: Secondary | ICD-10-CM | POA: Diagnosis not present

## 2022-10-12 NOTE — ED Triage Notes (Signed)
Pt via pov from home with head injury following a fall today at [employer]. She was trying to reach something up on the top shelf and fell back, hitting the floor. Pt c/o pain to posterior head and neck, as well as her left elbow. Pt endorses dizziness, denies LOC. Had some nausea at the time of the incident, but it is now resolved. Pt alert & oriented, but "not feeling quite right."

## 2022-10-12 NOTE — ED Notes (Signed)
Pt returned from CT °

## 2022-10-12 NOTE — ED Notes (Signed)
Patient transported to CT 

## 2022-10-12 NOTE — ED Notes (Signed)
Reviewed AVS/discharge instruction with patient. Time allotted for and all questions answered. Patient is agreeable for d/c and escorted to ed exit by staff.  

## 2022-10-12 NOTE — ED Provider Notes (Signed)
[place] EMERGENCY DEPARTMENT AT [facility] Provider Note   CSN: [id number] Arrival date & time: 10/12/22  1649     History Chief Complaint  Patient presents with   Fall    HPI Crystal Lloyd is a 77 y.o. female presenting for chief complaint of ground-level fall. -Pulling water off of top shelf of [employer].  -Fell over backwards hitting head and elbows on opposite shelf -Neck, head pain -Headache   Patient's recorded medical, surgical, social, medication list and allergies were reviewed in the Snapshot window as part of the initial history.   Review of Systems   Review of Systems  Constitutional:  Negative for chills and fever.  HENT:  Negative for ear pain and sore throat.   Eyes:  Negative for pain and visual disturbance.  Respiratory:  Negative for cough and shortness of breath.   Cardiovascular:  Negative for chest pain and palpitations.  Gastrointestinal:  Negative for abdominal pain and vomiting.  Genitourinary:  Negative for dysuria and hematuria.  Musculoskeletal:  Negative for arthralgias and back pain.  Skin:  Negative for color change and rash.  Neurological:  Positive for light-headedness and headaches. Negative for seizures and syncope.  All other systems reviewed and are negative.   Physical Exam Updated Vital Signs BP (!) 165/84 (BP Location: Right Arm)   Pulse 67   Temp 98.3 F (36.8 C) (Oral)   Resp 18   Ht 5' (1.524 m)   Wt 52.6 kg   LMP 04/24/1997   SpO2 99%   BMI 22.65 kg/m  Physical Exam Vitals and nursing note reviewed.  Constitutional:      General: She is not in acute distress.    Appearance: She is well-developed and normal weight. She is not ill-appearing or toxic-appearing.  HENT:     Head: Normocephalic and atraumatic.  Eyes:     Extraocular Movements: Extraocular movements intact.     Conjunctiva/sclera: Conjunctivae normal.     Pupils: Pupils are equal, round, and reactive to light.  Cardiovascular:     Rate  and Rhythm: Normal rate and regular rhythm.     Heart sounds: No murmur heard. Pulmonary:     Effort: Pulmonary effort is normal. No respiratory distress.     Breath sounds: Normal breath sounds.  Abdominal:     General: Abdomen is flat. There is no distension.     Palpations: Abdomen is soft.     Tenderness: There is no abdominal tenderness. There is no right CVA tenderness or left CVA tenderness.  Musculoskeletal:        General: No swelling, deformity or signs of injury. Normal range of motion.     Cervical back: Normal range of motion and neck supple. Tenderness present. No rigidity.  Skin:    General: Skin is warm and dry.  Neurological:     General: No focal deficit present.     Mental Status: She is alert and oriented to person, place, and time. Mental status is at baseline.     Cranial Nerves: No cranial nerve deficit.  Psychiatric:        Mood and Affect: Mood normal.      ED Course/ Medical Decision Making/ A&P    Procedures Procedures   Medications Ordered in ED Medications - No data to display Medical Decision Making:   [name] is a 77 y.o. female who presented to the ED today with a fall at their living facility detailed above. They are not on a  blood thinner. Additional history discussed with patient's family/caregivers.  Patient placed on continuous vitals and telemetry monitoring while in ED which was reviewed periodically.   Complete initial physical exam performed, notably the patient  was hemodynamically stable  in no acute distress. No obvious deformities or injuries appreciated on extensive physical exam including active range of motion of all joints.     Reviewed and confirmed nursing documentation for past medical history, family history, social history.    Initial Assessment/Plan:   This is a patient presenting with a moderate blunt mechanism trauma.  As such, I have considered intracranial injuries including intracranial hemorrhage,  intrathoracic injuries including blunt myocardial or blunt lung injury, blunt abdominal injuries including aortic dissection, bladder injury, spleen injury, liver injury and I have considered orthopedic injuries including extremity or spinal injury. This is most consistent with an acute life/limb threatening illness complicated by underlying chronic conditions.  With the patient's presentation of moderate mechanism trauma but an otherwise reassuring exam, patient warrants targeted evaluation for potential traumatic injuries. Will proceed with targeted evaluation for potential injuries. Will proceed with CT Head, Cervical Spine CT  Images reviewed and agree with radiology interpretation.  No results found.  Final Reassessment and Plan:   Reassessed at bedside. Patient lives around the corner and is requesting discharge for asynchronous follow-up of her CT scans.  On my review I did not identify any acute pathology but will follow-up the radiology reports.  May have to call patient back for second visit if CT scans have acute pathology.  Disposition:  I have considered need for hospitalization, however, considering all of the above, I believe this patient is stable for discharge at this time.  Patient/family educated about specific return precautions for given chief complaint and symptoms.  Patient/family educated about follow-up with PCP.     Patient/family expressed understanding of return precautions and need for follow-up. Patient spoken to regarding all imaging and laboratory results and appropriate follow up for these results. All education provided in verbal form with additional information in written form. Time was allowed for answering of patient questions. Patient discharged.        Clinical Impression:  1. Fall, initial encounter   2. Concussion without loss of consciousness, initial encounter      Discharge   Final Clinical Impression(s) / ED Diagnoses Final diagnoses:  Fall,  initial encounter  Concussion without loss of consciousness, initial encounter    Rx / DC Orders ED Discharge Orders     None         Glyn Ade, MD 10/12/22 1901

## 2022-10-13 ENCOUNTER — Ambulatory Visit (HOSPITAL_COMMUNITY): Payer: Medicare Other | Attending: Internal Medicine

## 2022-10-13 DIAGNOSIS — I5181 Takotsubo syndrome: Secondary | ICD-10-CM | POA: Insufficient documentation

## 2022-10-13 DIAGNOSIS — R931 Abnormal findings on diagnostic imaging of heart and coronary circulation: Secondary | ICD-10-CM | POA: Diagnosis not present

## 2022-10-13 LAB — ECHOCARDIOGRAM COMPLETE
Area-P 1/2: 3.59 cm2
S' Lateral: 3.1 cm

## 2022-10-14 ENCOUNTER — Other Ambulatory Visit (HOSPITAL_COMMUNITY): Payer: Self-pay

## 2022-10-18 DIAGNOSIS — R55 Syncope and collapse: Secondary | ICD-10-CM

## 2022-10-18 DIAGNOSIS — M79675 Pain in left toe(s): Secondary | ICD-10-CM | POA: Diagnosis not present

## 2022-10-24 DIAGNOSIS — M79675 Pain in left toe(s): Secondary | ICD-10-CM | POA: Diagnosis not present

## 2022-11-08 DIAGNOSIS — R55 Syncope and collapse: Secondary | ICD-10-CM | POA: Diagnosis not present

## 2022-11-14 ENCOUNTER — Telehealth: Payer: Self-pay

## 2022-11-14 NOTE — Telephone Encounter (Signed)
Transition Care Management Unsuccessful Follow-up Telephone Call  Date of discharge and from where:  10/12/2022 [facility]  Attempts:  1st Attempt  Reason for unsuccessful TCM follow-up call:  Left voice message  [facility]  [facility], [facility] Guide Direct Dial: [phone]  Website: [email or web address]

## 2022-11-15 ENCOUNTER — Telehealth: Payer: Self-pay

## 2022-11-15 DIAGNOSIS — R0981 Nasal congestion: Secondary | ICD-10-CM | POA: Diagnosis not present

## 2022-11-15 DIAGNOSIS — Z20822 Contact with and (suspected) exposure to covid-19: Secondary | ICD-10-CM | POA: Diagnosis not present

## 2022-11-15 NOTE — Telephone Encounter (Signed)
Transition Care Management Unsuccessful Follow-up Telephone Call  Date of discharge and from where:  10/12/2022 [facility]  Attempts:  2nd Attempt  Reason for unsuccessful TCM follow-up call:  Left voice message  [facility] Meriw[facility]  [facility], [facility] Guide Direct Dial: [phone]  Website: [email or web address]

## 2022-11-22 ENCOUNTER — Ambulatory Visit: Payer: Medicare Other | Attending: Internal Medicine | Admitting: Internal Medicine

## 2022-11-22 ENCOUNTER — Encounter: Payer: Self-pay | Admitting: Internal Medicine

## 2022-11-22 VITALS — BP 116/80 | HR 65 | Ht 60.0 in | Wt 117.0 lb

## 2022-11-22 DIAGNOSIS — I214 Non-ST elevation (NSTEMI) myocardial infarction: Secondary | ICD-10-CM

## 2022-11-22 DIAGNOSIS — I5181 Takotsubo syndrome: Secondary | ICD-10-CM | POA: Diagnosis not present

## 2022-11-22 DIAGNOSIS — E782 Mixed hyperlipidemia: Secondary | ICD-10-CM

## 2022-11-22 DIAGNOSIS — R55 Syncope and collapse: Secondary | ICD-10-CM

## 2022-11-22 DIAGNOSIS — I5021 Acute systolic (congestive) heart failure: Secondary | ICD-10-CM

## 2022-11-22 DIAGNOSIS — I455 Other specified heart block: Secondary | ICD-10-CM | POA: Diagnosis not present

## 2022-11-22 DIAGNOSIS — R931 Abnormal findings on diagnostic imaging of heart and coronary circulation: Secondary | ICD-10-CM

## 2022-11-22 IMAGING — DX DG ABDOMEN 1V
1 series · 1 of 1 positions shown · non-contrast
Comparison: 12/30/2020

CLINICAL DATA: Renal stones

EXAM:
ABDOMEN - 1 VIEW

[abdomen kub]
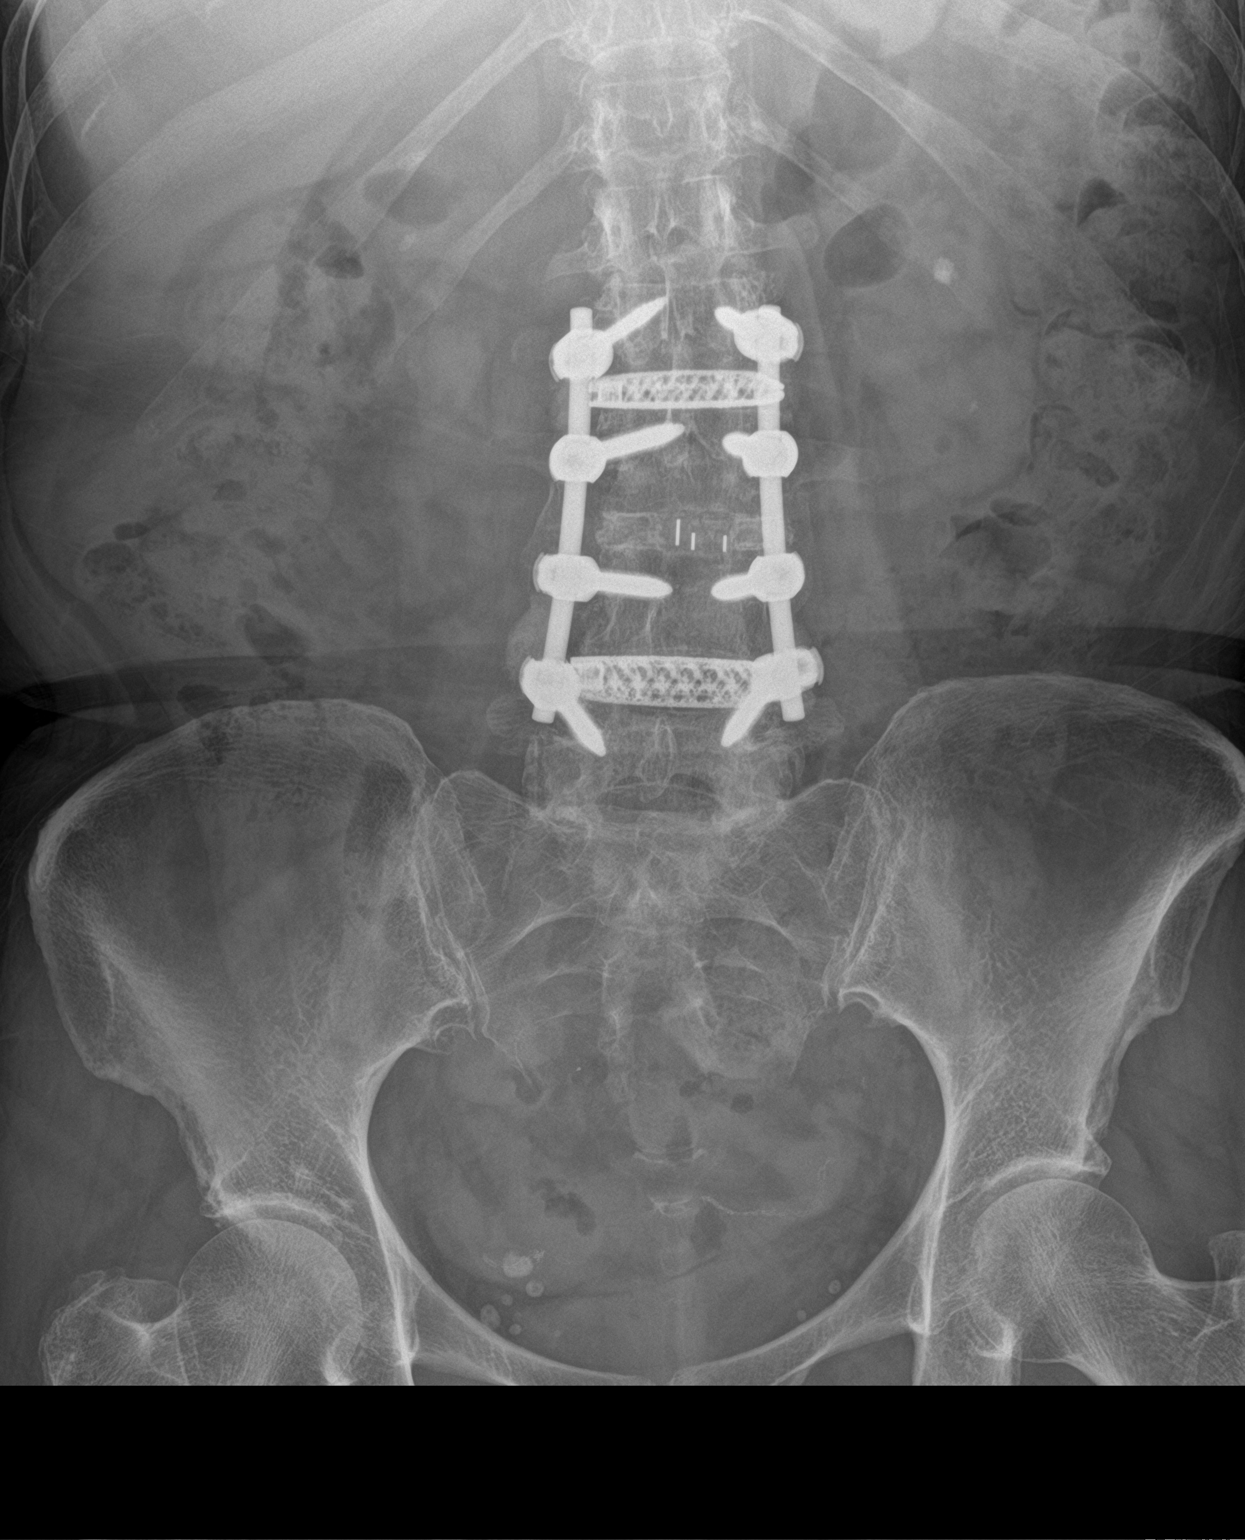

[1 of 1 positions shown; findings below may reference images not displayed]

FINDINGS: Bowel gas pattern is nonspecific. Moderate amount of stool is seen
in colon without fecal impaction in the rectosigmoid. There is 8 mm
calcific density in the midportion of left kidney. There is 3 mm
calcific density in the lower pole of left kidney. There is possible
5 mm calculus in the midportion of right kidney. Kidneys are partly
obscured by bowel contents. Postsurgical changes are noted in the
lumbar spine. Phleboliths are seen in the pelvis.
IMPRESSION: Bilateral renal stones, largest in the midportion of left kidney.

## 2022-11-22 NOTE — Patient Instructions (Addendum)
Medication Instructions:  Your physician recommends that you continue on your current medications as directed. Please refer to the Current Medication list given to you today.  *If you need a refill on your cardiac medications before your next appointment, please call your pharmacy*  Follow-Up: At [facility], you and your health needs are our priority.  As part of our continuing mission to provide you with exceptional heart care, we have created designated Provider Care Teams.  These Care Teams include your primary Cardiologist (physician) and Advanced Practice Providers (APPs -  Physician Assistants and Nurse Practitioners) who all work together to provide you with the care you need, when you need it.  Your next appointment:   6 month(s)  Provider:   Dr. Weston Brass   Other Instructions We have referred you to EP (Electrophysiology) Specialist; please see them as soon as possible.

## 2022-11-23 DIAGNOSIS — S92425D Nondisplaced fracture of distal phalanx of left great toe, subsequent encounter for fracture with routine healing: Secondary | ICD-10-CM | POA: Diagnosis not present

## 2022-12-05 ENCOUNTER — Telehealth: Payer: Self-pay | Admitting: Internal Medicine

## 2022-12-05 NOTE — Telephone Encounter (Signed)
Spoke with pt and she is aware of Dr. Perry's recommendations. 

## 2022-12-05 NOTE — Telephone Encounter (Signed)
Sounds like deglutition syncope (DS). Agree with EP evaluation. Does not need a GI evaluation--unless she was having dysphagia (which would be unrelated to DS). Thank Dr. Yolanda Bonine for checking with [place]. JP

## 2022-12-05 NOTE — Telephone Encounter (Signed)
Left message for pt to call back.  Patient returned the call and states she has been seeing cardiology. She wore a heart monitor that showed when  she swallows there is documentation that her heart stops sometimes. She is supposed to see an electrophysiologist for possible pacemaker. She is nervous about having a pacemaker. Her cardiologist also recommended that she be seen by GI to eval for any swallowing dysfunction. Please advise.

## 2022-12-05 NOTE — Progress Notes (Unsigned)
Electrophysiology Office Note:   Date:  12/06/2022  ID:  Crystal Lloyd, DOB [birth year], MRN [id number]  Primary Cardiologist: Lance Muss, MD Electrophysiologist: Nobie Putnam, MD      History of Present Illness:   Crystal Lloyd is a 77 y.o. female with h/o breast cancer status post bilateral mastectomies, chemotherapy, and radiation, hyperlipidemia with statin intolerance now on Praluent, hypothyroidism, and nonobstructive coronary disease who is being seen today for syncope and pauses at the request of Dr. Jacques Navy.   Patient reports a history of experiencing presyncope with eating over a number of years. This always occurs with eating breakfast - often pistachio nuts - or with swallowing large pills. Episodes often tend to occur when she is rushing around. She has never completely passed out, but has felt faint enough that she eased herself to the ground.   She endorsed this problem to her general cardiologist and a ZIO monitor was ordered.  During one of her diary events she had vagally mediated bradycardia while swallowing breakfast.  She also had a pause of 3.6 seconds, again occurring at 8:05 AM during breakfast hours.   Review of systems complete and found to be negative unless listed in HPI.   EP Information / Studies Reviewed:    EKG is not ordered today. EKG from 11/22/22 reviewed which showed sinus arrhythmia with normal PR and QRS durations.      Zio monitor 09/2022: 1 pause lasting 3.6 seconds    Echo 10/13/22: Normal LV size and function.  LVEF 60 to 65%.  Grade 1 diastolic dysfunction. Normal RV size and function. Mild MR.  No significant valvular disease.    Physical Exam:   VS:  BP 132/72   Pulse 78   Ht 5' (1.524 m)   Wt 117 lb 9.6 oz (53.3 kg)   LMP 04/24/1997   SpO2 98%   BMI 22.97 kg/m    Wt Readings from Last 3 Encounters:  12/06/22 117 lb 9.6 oz (53.3 kg)  11/22/22 117 lb (53.1 kg)  10/12/22 116 lb (52.6 kg)     GEN: Well  nourished, well developed in no acute distress NECK: No JVD; No carotid bruits CARDIAC: Normal rate and regular rhythm. RESPIRATORY:  Clear to auscultation without rales, wheezing or rhonchi  ABDOMEN: Soft, non-tender, non-distended EXTREMITIES:  No edema; No deformity   ASSESSMENT AND PLAN:   Crystal Lloyd is a 77 y.o. female with h/o breast cancer status post bilateral mastectomies, chemotherapy, and radiation, hyperlipidemia with statin intolerance now on Praluent, hypothyroidism, and nonobstructive coronary disease who is being seen today for syncope and pauses at the request of Dr. Jacques Navy.   Upon review of the patient's Zio monitor, at least 1 episode of vagally mediated bradycardia occurring while eating.  There is significant artifact on her 1 true pause event, lasting 3.6 seconds, limiting interpretation but overall appears to have a vagally mediated component and again was occurring during her usual time of breakfast and swallowing morning medications.  The pause of 3.6 seconds was not associated with symptoms.  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  She denies any syncope with exertion or exertional symptoms overall.  # Swallow syncope: -Avoid exacerbating  factors, such as specific foods.  Advised smaller mouthfuls and adequate chewing of food before swallowing. -Encouraged adequate fluid and salt intake. -Encouraged patient to seek GI evaluation to ensure there are no components of dysmotility or dysphagia. -That vagal tone tends to be higher in the mornings we discussed changing her pills to afternoon and being taken with plenty of fluids. -Indication for pacemaker at  this time.  We will try the above lifestyle changes and follow-up to see for improvement.  Follow up with Dr. Jimmey Ralph in 6 months  Signed, Nobie Putnam, MD

## 2022-12-05 NOTE — Telephone Encounter (Signed)
Inbound call from patient stating she recently saw her cardiologist and was advised when she swallows her heart stops and currently in the process to be evaluated for a pacemaker. States she was advised she needs to be evaluated but her gastroenterologist provider as well. Patient is requesting a call to discuss further. Please advise, thank you

## 2022-12-06 ENCOUNTER — Encounter: Payer: Self-pay | Admitting: Cardiology

## 2022-12-06 ENCOUNTER — Ambulatory Visit: Payer: Medicare Other | Attending: Cardiology | Admitting: Cardiology

## 2022-12-06 VITALS — BP 132/72 | HR 78 | Ht 60.0 in | Wt 117.6 lb

## 2022-12-06 DIAGNOSIS — R1310 Dysphagia, unspecified: Secondary | ICD-10-CM | POA: Diagnosis not present

## 2022-12-06 DIAGNOSIS — R55 Syncope and collapse: Secondary | ICD-10-CM

## 2022-12-06 DIAGNOSIS — R42 Dizziness and giddiness: Secondary | ICD-10-CM | POA: Diagnosis not present

## 2022-12-06 DIAGNOSIS — R001 Bradycardia, unspecified: Secondary | ICD-10-CM | POA: Diagnosis not present

## 2022-12-06 NOTE — Patient Instructions (Signed)
Medication Instructions:  Your physician recommends that you continue on your current medications as directed. Please refer to the Current Medication list given to you today.  *If you need a refill on your cardiac medications before your next appointment, please call your pharmacy*  Follow-Up: At [facility], you and your health needs are our priority.  As part of our continuing mission to provide you with exceptional heart care, we have created designated Provider Care Teams.  These Care Teams include your primary Cardiologist (physician) and Advanced Practice Providers (APPs -  Physician Assistants and Nurse Practitioners) who all work together to provide you with the care you need, when you need it.  Your next appointment:   6 months  Provider:   You may see Nobie Putnam, MD or one of the following Advanced Practice Providers on your designated Care Team:   Francis Dowse, [place] [address]" [place], [place] Sherie Don, NP Canary Brim, NP

## 2022-12-15 DIAGNOSIS — J40 Bronchitis, not specified as acute or chronic: Secondary | ICD-10-CM | POA: Diagnosis not present

## 2022-12-15 DIAGNOSIS — R058 Other specified cough: Secondary | ICD-10-CM | POA: Diagnosis not present

## 2022-12-17 ENCOUNTER — Other Ambulatory Visit: Payer: Self-pay | Admitting: Interventional Cardiology

## 2022-12-20 ENCOUNTER — Telehealth: Payer: Self-pay | Admitting: Internal Medicine

## 2022-12-20 NOTE — Telephone Encounter (Signed)
Spoke with pt, aware will forward to our prior auth team.

## 2022-12-20 NOTE — Telephone Encounter (Signed)
Pt c/o medication issue:  1. Name of Medication: NEXLIZET 180-10 MG TABS   2. How are you currently taking this medication (dosage and times per day)? Take 1 tablet by mouth daily.   3. Are you having a reaction (difficulty breathing--STAT)? No   4. What is your medication issue? Patient needs a new pre-authorization to get medication from [facility] [place], [place] - [address]

## 2022-12-21 ENCOUNTER — Other Ambulatory Visit (HOSPITAL_COMMUNITY): Payer: Self-pay

## 2022-12-21 ENCOUNTER — Telehealth: Payer: Self-pay | Admitting: Pharmacy Technician

## 2022-12-21 ENCOUNTER — Telehealth: Payer: Self-pay | Admitting: Internal Medicine

## 2022-12-21 NOTE — Telephone Encounter (Signed)
Pharmacy Patient Advocate Encounter   Received notification from Pt Calls Messages that prior authorization for nexlizet is required/requested.   Insurance verification completed.   The patient is insured through [facility] .   Per test claim: PA required; PA submitted to above mentioned insurance via CoverMyMeds Key/confirmation #/EOC [facility] Status is pending

## 2022-12-21 NOTE — Telephone Encounter (Signed)
Pharmacy Patient Advocate Encounter  Received notification from [facility] that Prior Authorization for nexlizet has been APPROVED from 12/21/22 to 12/21/23. Ran test claim, Copay is $170.83- one month (GAP). This test claim was processed through [facility]- copay amounts may vary at other pharmacies due to pharmacy/plan contracts, or as the patient moves through the different stages of their insurance plan.   PA #/Case ID/Reference #: [id number]

## 2022-12-21 NOTE — Telephone Encounter (Signed)
Pt c/o medication issue:  1. Name of Medication:   NEXLIZET 180-10 MG TABS    2. How are you currently taking this medication (dosage and times per day)?   Take 1 tablet by mouth daily.    3. Are you having a reaction (difficulty breathing--STAT)? No  4. What is your medication issue? Insurance company calling to make provider aware that medication has been approved as of today through 12/21/23. Please advise

## 2022-12-26 ENCOUNTER — Encounter (HOSPITAL_BASED_OUTPATIENT_CLINIC_OR_DEPARTMENT_OTHER): Payer: Self-pay | Admitting: Obstetrics & Gynecology

## 2022-12-26 ENCOUNTER — Ambulatory Visit (HOSPITAL_BASED_OUTPATIENT_CLINIC_OR_DEPARTMENT_OTHER): Payer: Medicare Other | Admitting: Obstetrics & Gynecology

## 2022-12-26 ENCOUNTER — Other Ambulatory Visit (HOSPITAL_COMMUNITY)
Admission: RE | Admit: 2022-12-26 | Discharge: 2022-12-26 | Disposition: A | Discharge status: Home / Self Care | Payer: Medicare Other | Source: Ambulatory Visit | Attending: Obstetrics & Gynecology | Admitting: Obstetrics & Gynecology

## 2022-12-26 VITALS — BP 138/80 | HR 67 | Ht 59.25 in | Wt 117.2 lb

## 2022-12-26 DIAGNOSIS — Z9189 Other specified personal risk factors, not elsewhere classified: Secondary | ICD-10-CM

## 2022-12-26 DIAGNOSIS — Z124 Encounter for screening for malignant neoplasm of cervix: Secondary | ICD-10-CM | POA: Insufficient documentation

## 2022-12-26 DIAGNOSIS — D013 Carcinoma in situ of anus and anal canal: Secondary | ICD-10-CM

## 2022-12-26 DIAGNOSIS — M8580 Other specified disorders of bone density and structure, unspecified site: Secondary | ICD-10-CM | POA: Diagnosis not present

## 2022-12-26 DIAGNOSIS — R55 Syncope and collapse: Secondary | ICD-10-CM

## 2022-12-26 DIAGNOSIS — N952 Postmenopausal atrophic vaginitis: Secondary | ICD-10-CM

## 2022-12-26 DIAGNOSIS — Z9079 Acquired absence of other genital organ(s): Secondary | ICD-10-CM

## 2022-12-26 DIAGNOSIS — Z90722 Acquired absence of ovaries, bilateral: Secondary | ICD-10-CM

## 2022-12-26 DIAGNOSIS — Z853 Personal history of malignant neoplasm of breast: Secondary | ICD-10-CM
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
## 2022-12-28 LAB — CYTOLOGY - PAP: Diagnosis: NEGATIVE

## 2022-12-30 DIAGNOSIS — R7301 Impaired fasting glucose: Secondary | ICD-10-CM | POA: Diagnosis not present

## 2022-12-30 DIAGNOSIS — E039 Hypothyroidism, unspecified: Secondary | ICD-10-CM | POA: Diagnosis not present

## 2022-12-30 DIAGNOSIS — E559 Vitamin D deficiency, unspecified: Secondary | ICD-10-CM | POA: Diagnosis not present

## 2022-12-30 DIAGNOSIS — E785 Hyperlipidemia, unspecified: Secondary | ICD-10-CM | POA: Diagnosis not present

## 2023-01-04 DIAGNOSIS — R82998 Other abnormal findings in urine: Secondary | ICD-10-CM | POA: Diagnosis not present

## 2023-01-04 DIAGNOSIS — Z0000 Encounter for general adult medical examination without abnormal findings: Secondary | ICD-10-CM | POA: Diagnosis not present

## 2023-01-04 DIAGNOSIS — Z1339 Encounter for screening examination for other mental health and behavioral disorders: Secondary | ICD-10-CM | POA: Diagnosis not present

## 2023-01-04 DIAGNOSIS — Z1331 Encounter for screening for depression: Secondary | ICD-10-CM | POA: Diagnosis not present

## 2023-01-04 DIAGNOSIS — I700 Atherosclerosis of aorta: Secondary | ICD-10-CM | POA: Diagnosis not present

## 2023-01-09 ENCOUNTER — Ambulatory Visit (HOSPITAL_COMMUNITY): Payer: Medicare Other

## 2023-01-16 ENCOUNTER — Ambulatory Visit (HOSPITAL_BASED_OUTPATIENT_CLINIC_OR_DEPARTMENT_OTHER)
Admission: RE | Admit: 2023-01-16 | Discharge: 2023-01-16 | Disposition: A | Discharge status: Home / Self Care | Payer: Medicare Other | Source: Ambulatory Visit | Attending: Pulmonary Disease | Admitting: Pulmonary Disease

## 2023-01-16 DIAGNOSIS — I2721 Secondary pulmonary arterial hypertension: Secondary | ICD-10-CM | POA: Insufficient documentation

## 2023-01-16 DIAGNOSIS — I700 Atherosclerosis of aorta: Secondary | ICD-10-CM | POA: Diagnosis not present

## 2023-01-16 DIAGNOSIS — J479 Bronchiectasis, uncomplicated: Secondary | ICD-10-CM | POA: Diagnosis not present

## 2023-01-16 DIAGNOSIS — J219 Acute bronchiolitis, unspecified: Secondary | ICD-10-CM | POA: Insufficient documentation

## 2023-01-16 DIAGNOSIS — N200 Calculus of kidney: Secondary | ICD-10-CM | POA: Insufficient documentation

## 2023-01-16 DIAGNOSIS — I2510 Atherosclerotic heart disease of native coronary artery without angina pectoris: Secondary | ICD-10-CM | POA: Insufficient documentation

## 2023-01-31 ENCOUNTER — Other Ambulatory Visit (HOSPITAL_COMMUNITY): Payer: Self-pay | Admitting: Pulmonary Disease

## 2023-01-31 ENCOUNTER — Encounter: Payer: Self-pay | Admitting: Pulmonary Disease

## 2023-01-31 ENCOUNTER — Ambulatory Visit: Payer: Medicare Other | Admitting: Pulmonary Disease

## 2023-01-31 VITALS — BP 128/64 | HR 60 | Temp 98.1°F | Ht 60.0 in | Wt 120.0 lb

## 2023-01-31 DIAGNOSIS — R059 Cough, unspecified: Secondary | ICD-10-CM

## 2023-01-31 DIAGNOSIS — R1310 Dysphagia, unspecified: Secondary | ICD-10-CM

## 2023-01-31 DIAGNOSIS — J219 Acute bronchiolitis, unspecified: Secondary | ICD-10-CM

## 2023-01-31 DIAGNOSIS — J479 Bronchiectasis, uncomplicated: Secondary | ICD-10-CM

## 2023-01-31 LAB — PULMONARY FUNCTION TEST
DL/VA % pred: 104 %
DL/VA: 4.42 ml/min/mmHg/L
DLCO cor % pred: 92 %
DLCO cor: 15.36 ml/min/mmHg
DLCO unc % pred: 92 %
DLCO unc: 15.36 ml/min/mmHg
FEF 25-75 Post: 1.89 L/s
FEF 25-75 Pre: 1.59 L/s
FEF2575-%Change-Post: 19 %
FEF2575-%Pred-Post: 131 %
FEF2575-%Pred-Pre: 110 %
FEV1-%Change-Post: 2 %
FEV1-%Pred-Post: 108 %
FEV1-%Pred-Pre: 106 %
FEV1-Post: 1.9 L
FEV1-Pre: 1.85 L
FEV1FVC-%Change-Post: 2 %
FEV1FVC-%Pred-Pre: 103 %
FEV6-%Change-Post: 0 %
FEV6-%Pred-Post: 107 %
FEV6-%Pred-Pre: 107 %
FEV6-Post: 2.39 L
FEV6-Pre: 2.37 L
FEV6FVC-%Pred-Post: 105 %
FEV6FVC-%Pred-Pre: 105 %
FVC-%Change-Post: 0 %
FVC-%Pred-Post: 102 %
FVC-%Pred-Pre: 101 %
FVC-Post: 2.39 L
FVC-Pre: 2.37 L
Post FEV1/FVC ratio: 79 %
Post FEV6/FVC ratio: 100 %
Pre FEV1/FVC ratio: 78 %
Pre FEV6/FVC Ratio: 100 %
RV % pred: 101 %
RV: 2.12 L
TLC % pred: 100 %
TLC: 4.5 L

## 2023-01-31 NOTE — Patient Instructions (Addendum)
 Your breathing tests are normal  Your CT Chest scan is stable compared to the year before  We will plan on following up with a video visit in a year and then schedule a CT chest and repeat PFTs in 2027  Call sooner if needed

## 2023-01-31 NOTE — Patient Instructions (Signed)
 Full PFT performed today.

## 2023-01-31 NOTE — Progress Notes (Signed)
 Full PFT performed today.
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
## 2023-02-01 ENCOUNTER — Encounter: Payer: Self-pay | Admitting: Pulmonary Disease

## 2023-02-03 DIAGNOSIS — H43813 Vitreous degeneration, bilateral: Secondary | ICD-10-CM | POA: Diagnosis not present

## 2023-02-03 DIAGNOSIS — H02421 Myogenic ptosis of right eyelid: Secondary | ICD-10-CM | POA: Diagnosis not present

## 2023-02-03 DIAGNOSIS — Z961 Presence of intraocular lens: Secondary | ICD-10-CM | POA: Diagnosis not present

## 2023-02-03 DIAGNOSIS — H16223 Keratoconjunctivitis sicca, not specified as Sjogren's, bilateral: Secondary | ICD-10-CM | POA: Diagnosis not present

## 2023-02-07 ENCOUNTER — Encounter: Payer: Self-pay | Admitting: Internal Medicine

## 2023-02-08 NOTE — Progress Notes (Signed)
 Interstitial Lung Disease Multidisciplinary Conference   Crystal Lloyd    MRN [id number]    DOB 07-07-1945  Primary Care Physician:South, Garnette, [place]  Referring Physician: Dr. Kara  Time of Conference: 7.00am- 8.00am Date of conference: 02/07/2023 Location of Conference: -  Virtual  Participating Pulmonary: Dr. Dorethia Cave, Dr Lonna Coder, Dr. Dorn Kara Pathology: Dr Katrine Muskrat Radiology: Dr Toribio Aye  Others: n/a  Brief History: Review her recent HRCT chest with the team and radiologist.  Currently asymptomatic   PFT    Latest Ref Rng & Units 01/31/2023   12:47 PM 05/11/2016    3:50 PM  PFT Results  FVC-Pre L 2.37  P 2.74   FVC-Predicted Pre % 101  P 107   FVC-Post L 2.39  P 2.66   FVC-Predicted Post % 102  P 104   Pre FEV1/FVC % % 78  P 80   Post FEV1/FCV % % 79  P 83   FEV1-Pre L 1.85  P 2.20   FEV1-Predicted Pre % 106  P 113   FEV1-Post L 1.90  P 2.21   DLCO uncorrected ml/min/mmHg 15.36  P 17.22   DLCO UNC% % 92  P 91   DLCO corrected ml/min/mmHg 15.36  P 16.50   DLCO COR %Predicted % 92  P 87   DLVA Predicted % 104  P 98   TLC L 4.50  P 4.78   TLC % Predicted % 100  P 107   RV % Predicted % 101  P 102     P Preliminary result      MDD discussion of CT scan    - Date or time period of scan:  HRCT: 01/16/2023 CT Chest: 01/26/2022 CT Chest: 03/06/2018   - Discussion synopsis:  CT scan 2020 and Dec 2024 which is HRCT. In 2020, s/p bilateral mastectomy. Some subplueral reticulation L > R anterior - prob s/p XRT. In lower lobe maybe septal thickening v ggo. In 2024 -> increasing subpleural reticulation anteriorly but also in lower lobe septal thickening and basal changes, ground glass persists/slightly worse. She has emerging architectrual distortion, peripheral bronchielectasis.  It is progressive.  Lower lobe not explained by post radiation changes. Slightly overall worse.   - What is the final conclusion per 2018  ATS/Fleischner Criteria - Unclear if is ILD but possible ILD. Other is MAI    - Concordance with official report: Discordant  Pathology discussion of biopsy: n/a    MDD Impression/Recs: 1) serology; b) ILD questionaire; c) BAL; d) then surgical lung biopsy   Time Spent in preparation and discussion:  > 30 min    SIGNATURE   Dr. Dorethia Cave, M.D., F.C.C.P,  Pulmonary and Critical Care Medicine Staff Physician, [facility] Center Director - Interstitial Lung Disease  Program  Pulmonary Fibrosis Faizan Geraci [facility] at [facility] [place], [place], [address]  Pager: [phone], If no answer or between  15:00h - 7:00h: call [phone] Telephone: [phone]  3:49 PM 02/08/2023 ...................................................................................................................SABRA References: Diagnosis of Hypersensitivity Pneumonitis in Adults. An Official ATS/JRS/ALAT Clinical Practice Guideline. Ragu G et al, Am J Respir Crit Care Med. 2020 Aug 1;202(3):e36-e69.       Diagnosis of Idiopathic Pulmonary Fibrosis. An Official ATS/ERS/JRS/ALAT Clinical Practice Guideline. Raghu G et al, Am J Respir Crit Care Med. 2018 Sep 1;198(5):e44-e68.   IPF Suspected   Histopath ology Pattern      UIP  Probable UIP  Indeterminate  for  UIP  Alternative  diagnosis    UIP  IPF  IPF  IPF  Non-IPF dx   HRCT   Probabe UIP  IPF  IPF  IPF (Likely)**  Non-IPF dx  Pattern  Indeterminate for UIP  IPF  IPF (Likely)**  Indeterminate  for IPF**  Non-IPF dx    Alternative diagnosis  IPF (Likely)**/ non-IPF dx  Non-IPF dx  Non-IPF dx  Non-IPF dx     Idiopathic pulmonary fibrosis diagnosis based upon HRCT and Biopsy paterns.  ** IPF is the likely diagnosis when any of following features are present:  Moderate-to-severe traction bronchiectasis/bronchiolectasis (defined as mild traction bronchiectasis/bronchiolectasis in four or  more lobes including the lingual as a lobe, or moderate to severe traction bronchiectasis in two or more lobes) in a man over age 3 years or in a woman over age 76 years Extensive (>30%) reticulation on HRCT and an age >76 years  Increased neutrophils and/or absence of lymphocytosis in BAL fluid  Multidisciplinary discussion reaches a confident diagnosis of IPF.   **Indeterminate for IPF  Without an adequate biopsy is unlikely to be IPF  With an adequate biopsy may be reclassified to a more specific diagnosis after multidisciplinary discussion and/or additional consultation.   dx = diagnosis; HRCT = high-resolution computed tomography; IPF = idiopathic pulmonary fibrosis; UIP = usual interstitial pneumonia.

## 2023-02-14 ENCOUNTER — Ambulatory Visit (HOSPITAL_COMMUNITY)
Admission: RE | Admit: 2023-02-14 | Discharge: 2023-02-14 | Disposition: A | Discharge status: Home / Self Care | Payer: Medicare Other | Source: Ambulatory Visit | Attending: Endocrinology | Admitting: Endocrinology

## 2023-02-14 DIAGNOSIS — R001 Bradycardia, unspecified: Secondary | ICD-10-CM | POA: Diagnosis not present

## 2023-02-14 DIAGNOSIS — R933 Abnormal findings on diagnostic imaging of other parts of digestive tract: Secondary | ICD-10-CM | POA: Diagnosis not present

## 2023-02-14 DIAGNOSIS — R1314 Dysphagia, pharyngoesophageal phase: Secondary | ICD-10-CM | POA: Diagnosis not present

## 2023-02-14 DIAGNOSIS — R1310 Dysphagia, unspecified: Secondary | ICD-10-CM

## 2023-02-14 DIAGNOSIS — R059 Cough, unspecified: Secondary | ICD-10-CM

## 2023-02-14 DIAGNOSIS — R55 Syncope and collapse: Secondary | ICD-10-CM | POA: Diagnosis not present

## 2023-02-14 NOTE — Therapy (Signed)
Modified Barium Swallow Study  Patient Details  Name: Crystal Lloyd MRN: [id number] Date of Birth: 16-Jun-1945  Today's Date: 02/14/2023  Modified Barium Swallow completed.  Full report located under Chart Review in the Imaging Section.  History of Present Illness Dr. Leyanna Motherway is a 78 y.o. female with PMH: CAD, breast cancer s/p bilateral mastectomies, Raynaud's phenomenon, asthma (seasonal), stress-induced cardiomyopathy, orthostatic hypotension, hypothyroidism. She was diagnosed with swallow syncope with heart stop greater than three seconds on 14 day monitor. Dr. Cherlyn Labella cardiologist (Dr. Jacques Navy) as well as her pulmonologist (Dr. Francine Graven) recommending modified barium swallow study to r/o dysphagia.   Clinical Impression Patient presents with oral phase of swallow that is [facility] and pharyngoesophageal phase with suspected age-related changes consisting of cervical osteophytes (patient already aware of these) and prominent cricopharyngeal bar. There were instances of trace to mild vallecular residuals and trace pyriform sinus residuals which cleared with subsequent swallow. No instances of penetration or aspiration observed during any phase of the swallow. 13mm barium tablet taken with plain water transited fully through esophagus without difficulty, however patient did cough once when drinking plain water with tablet. Aspiration was not visualized secondary to using plain water and so SLP then assessed patient's swallow with ultra thin barium (1/2 water to 1/2 thin barium) and no instances of penetration or aspiration were seen even with larger, straw sips.  Patient did report h/o reflux but did not report any recent symptoms. SLP is not recommending further skilled intervention or assessment of swallow function but is recommending patient follow GERD/reflux precautions and discuss with her GI (she reports an appointment set up in July). SLP also recommended patient limit/avoid cold and  carbonated beverages and to eat slowly.   Swallow Evaluation Recommendations Recommendations: PO diet PO Diet Recommendation: Regular;Thin liquids (Level 0) Liquid Administration via: Cup;Straw Medication Administration: Whole meds with liquid Supervision: Patient able to self-feed Postural changes: Position pt fully upright for meals;Stay upright 30-60 min after meals Oral care recommendations: Oral care BID (2x/day) Recommended consults: Consider GI consultation      Pablo Lawrence 02/14/2023,2:12 PM

## 2023-03-03 ENCOUNTER — Other Ambulatory Visit: Payer: Self-pay | Admitting: Medical Genetics

## 2023-03-08 ENCOUNTER — Other Ambulatory Visit (HOSPITAL_COMMUNITY)
Admission: RE | Admit: 2023-03-08 | Discharge: 2023-03-08 | Disposition: A | Discharge status: Home / Self Care | Payer: Self-pay | Source: Ambulatory Visit | Attending: Medical Genetics | Admitting: Medical Genetics

## 2023-03-18 LAB — GENECONNECT MOLECULAR SCREEN: Genetic Analysis Overall Interpretation: NEGATIVE

## 2023-04-06 ENCOUNTER — Other Ambulatory Visit: Payer: Self-pay

## 2023-04-06 MED ORDER — NEXLIZET 180-10 MG PO TABS: 1.0000 | ORAL_TABLET | Freq: Every day | ORAL | 2 refills | Status: AC

## 2023-04-21 DIAGNOSIS — H02421 Myogenic ptosis of right eyelid: Secondary | ICD-10-CM | POA: Diagnosis not present

## 2023-04-21 DIAGNOSIS — S01111A Laceration without foreign body of right eyelid and periocular area, initial encounter: Secondary | ICD-10-CM | POA: Diagnosis not present

## 2023-04-21 DIAGNOSIS — H16223 Keratoconjunctivitis sicca, not specified as Sjogren's, bilateral: Secondary | ICD-10-CM | POA: Diagnosis not present

## 2023-04-21 DIAGNOSIS — H43813 Vitreous degeneration, bilateral: Secondary | ICD-10-CM | POA: Diagnosis not present

## 2023-05-01 ENCOUNTER — Encounter: Payer: Self-pay | Admitting: Podiatry

## 2023-05-01 ENCOUNTER — Ambulatory Visit: Admitting: Podiatry

## 2023-05-01 VITALS — Resp 16 | Ht 60.0 in | Wt 120.0 lb

## 2023-05-01 DIAGNOSIS — B351 Tinea unguium: Secondary | ICD-10-CM | POA: Diagnosis not present
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
## 2023-05-01 NOTE — Addendum Note (Signed)
 Addended by: Daryel November on: 05/01/2023 05:05 PM   Modules accepted: Orders

## 2023-05-23 ENCOUNTER — Other Ambulatory Visit: Payer: Self-pay | Admitting: Podiatry

## 2023-05-29 ENCOUNTER — Encounter: Payer: Self-pay | Admitting: Podiatry

## 2023-05-29 ENCOUNTER — Ambulatory Visit: Admitting: Podiatry

## 2023-05-29 DIAGNOSIS — B351 Tinea unguium: Secondary | ICD-10-CM

## 2023-05-29 DIAGNOSIS — M67471 Ganglion, right ankle and foot: Secondary | ICD-10-CM
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
## 2023-07-14 DIAGNOSIS — M25561 Pain in right knee: Secondary | ICD-10-CM | POA: Diagnosis not present

## 2023-07-17 ENCOUNTER — Other Ambulatory Visit (HOSPITAL_COMMUNITY): Payer: Self-pay | Admitting: Physician Assistant

## 2023-07-17 ENCOUNTER — Ambulatory Visit (HOSPITAL_COMMUNITY)
Admission: RE | Admit: 2023-07-17 | Discharge: 2023-07-17 | Disposition: A | Discharge status: Home / Self Care | Source: Ambulatory Visit | Attending: Surgery | Admitting: Surgery

## 2023-07-17 DIAGNOSIS — R52 Pain, unspecified: Secondary | ICD-10-CM | POA: Diagnosis not present

## 2023-07-21 ENCOUNTER — Other Ambulatory Visit

## 2023-07-21 ENCOUNTER — Ambulatory Visit (INDEPENDENT_AMBULATORY_CARE_PROVIDER_SITE_OTHER): Admitting: *Deleted

## 2023-07-21 DIAGNOSIS — B351 Tinea unguium: Secondary | ICD-10-CM

## 2023-07-21 NOTE — Progress Notes (Signed)
 Patient presents today for the 1st laser treatment. Diagnosed with mycotic nail infection by Dr. Sikora.   Toenail most affected 1st bilateral and 2-5 right.  All other systems are negative.  Nails were filed thin. Laser therapy was administered to 1-5 toenails bilateral and patient tolerated the treatment well. All safety precautions were in place.    Follow up in 4 weeks for laser # 2.

## 2023-07-21 NOTE — Patient Instructions (Signed)

## 2023-08-03 DIAGNOSIS — M1711 Unilateral primary osteoarthritis, right knee: Secondary | ICD-10-CM | POA: Diagnosis not present

## 2023-08-07 DIAGNOSIS — N200 Calculus of kidney: Secondary | ICD-10-CM | POA: Diagnosis not present

## 2023-08-07 DIAGNOSIS — N390 Urinary tract infection, site not specified: Secondary | ICD-10-CM | POA: Diagnosis not present

## 2023-08-07 DIAGNOSIS — E039 Hypothyroidism, unspecified: Secondary | ICD-10-CM | POA: Diagnosis not present

## 2023-08-08 DIAGNOSIS — Z961 Presence of intraocular lens: Secondary | ICD-10-CM | POA: Diagnosis not present

## 2023-08-08 DIAGNOSIS — H02421 Myogenic ptosis of right eyelid: Secondary | ICD-10-CM | POA: Diagnosis not present

## 2023-08-08 DIAGNOSIS — H43813 Vitreous degeneration, bilateral: Secondary | ICD-10-CM | POA: Diagnosis not present

## 2023-08-09 DIAGNOSIS — Z8719 Personal history of other diseases of the digestive system: Secondary | ICD-10-CM | POA: Diagnosis not present

## 2023-08-09 DIAGNOSIS — R55 Syncope and collapse: Secondary | ICD-10-CM | POA: Diagnosis not present

## 2023-08-09 DIAGNOSIS — D013 Carcinoma in situ of anus and anal canal: Secondary | ICD-10-CM | POA: Diagnosis not present

## 2023-08-09 DIAGNOSIS — Z860101 Personal history of adenomatous and serrated colon polyps: Secondary | ICD-10-CM | POA: Diagnosis not present

## 2023-08-15 ENCOUNTER — Ambulatory Visit (INDEPENDENT_AMBULATORY_CARE_PROVIDER_SITE_OTHER)

## 2023-08-15 DIAGNOSIS — B351 Tinea unguium: Secondary | ICD-10-CM

## 2023-08-15 NOTE — Progress Notes (Signed)
 Patient presents today for the 2nd laser treatment. Diagnosed with mycotic nail infection by Dr. Sikora.   Toenail most affected 1st bilateral and 2-5 right.  All other systems are negative.  Nails were filed thin. Laser therapy was administered to 1-5 toenails bilateral and patient tolerated the treatment well. All safety precautions were in place.    Follow up in 4 weeks for laser # 3.

## 2023-08-16 DIAGNOSIS — R3121 Asymptomatic microscopic hematuria: Secondary | ICD-10-CM | POA: Diagnosis not present

## 2023-08-16 DIAGNOSIS — N200 Calculus of kidney: Secondary | ICD-10-CM | POA: Diagnosis not present

## 2023-09-13 ENCOUNTER — Ambulatory Visit (INDEPENDENT_AMBULATORY_CARE_PROVIDER_SITE_OTHER): Admitting: Pulmonary Disease

## 2023-09-13 ENCOUNTER — Encounter: Payer: Self-pay | Admitting: Pulmonary Disease

## 2023-09-13 ENCOUNTER — Other Ambulatory Visit (INDEPENDENT_AMBULATORY_CARE_PROVIDER_SITE_OTHER)

## 2023-09-13 VITALS — BP 128/84 | HR 65 | Ht 59.5 in | Wt 114.0 lb

## 2023-09-13 DIAGNOSIS — J479 Bronchiectasis, uncomplicated: Secondary | ICD-10-CM | POA: Diagnosis not present

## 2023-09-13 DIAGNOSIS — J849 Interstitial pulmonary disease, unspecified: Secondary | ICD-10-CM | POA: Diagnosis not present

## 2023-09-13 DIAGNOSIS — R918 Other nonspecific abnormal finding of lung field: Secondary | ICD-10-CM

## 2023-09-13 LAB — SEDIMENTATION RATE: Sed Rate: 7 mm/h (ref 0–30)

## 2023-09-13 NOTE — Patient Instructions (Addendum)
 We will check lab for autoimmune workup, we will schedule you a time to get labs done  We will schedule you for pulmonary function tests and HRCT Chest in early December  Continue to monitor cough symptoms, if worsens please let us  know.   Follow up in December after testing
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
## 2023-09-14 LAB — CK: Total CK: 245 U/L — ABNORMAL HIGH (ref 17–177)

## 2023-09-14 LAB — C-REACTIVE PROTEIN: CRP: <1 mg/dL (ref 0.5–20.0)

## 2023-09-18 LAB — HYPERSENSITIVITY PNEUMONITIS
A. Pullulans Abs: NEGATIVE
A.Fumigatus #1 Abs: NEGATIVE
Micropolyspora faeni, IgG: NEGATIVE
Pigeon Serum Abs: NEGATIVE
Thermoact. Saccharii: NEGATIVE
Thermoactinomyces vulgaris, IgG: NEGATIVE

## 2023-09-18 LAB — RNP ANTIBODIES: ENA RNP Ab: <0.2 AI (ref 0.0–0.9)

## 2023-09-19 ENCOUNTER — Ambulatory Visit (INDEPENDENT_AMBULATORY_CARE_PROVIDER_SITE_OTHER)

## 2023-09-19 DIAGNOSIS — B351 Tinea unguium: Secondary | ICD-10-CM

## 2023-09-19 LAB — ANTI-SCLERODERMA ANTIBODY: Scleroderma (Scl-70) (ENA) Antibody, IgG: <1 AI

## 2023-09-19 LAB — ANTI-SMITH ANTIBODY: ENA SM Ab Ser-aCnc: <1 AI

## 2023-09-19 LAB — SJOGREN'S SYNDROME ANTIBODS(SSA + SSB)
SSA (Ro) (ENA) Antibody, IgG: <1 AI
SSB (La) (ENA) Antibody, IgG: <1 AI

## 2023-09-19 LAB — ANA: Anti Nuclear Antibody (ANA): POSITIVE — AB

## 2023-09-19 LAB — ANTI-DNA ANTIBODY, DOUBLE-STRANDED: ds DNA Ab: <1 [IU]/mL

## 2023-09-19 LAB — ANTI-NUCLEAR AB-TITER (ANA TITER): ANA Titer 1: 1:40 {titer} — ABNORMAL HIGH

## 2023-09-19 LAB — RHEUMATOID FACTOR: Rheumatoid fact SerPl-aCnc: 10 [IU]/mL (ref <14)

## 2023-09-19 LAB — CYCLIC CITRUL PEPTIDE ANTIBODY, IGG: Cyclic Citrullin Peptide Ab: <16 U

## 2023-09-19 LAB — ANCA SCREEN W REFLEX TITER: ANCA SCREEN: NEGATIVE

## 2023-09-19 LAB — ALDOLASE

## 2023-09-19 NOTE — Progress Notes (Signed)
 Patient presents today for the 3rd laser treatment. Diagnosed with mycotic nail infection by Dr. Sikora.   Toenail most affected 1st bilateral and 2-5 right foot.  All other systems are negative.  Nails were filed thin. Laser therapy was administered to 1-5 toenails bilaterally and patient tolerated the treatment well. All safety precautions were in place.    Follow up in 6 weeks for laser # 4.

## 2023-09-20 ENCOUNTER — Encounter: Payer: Self-pay | Admitting: Pulmonary Disease

## 2023-09-21 ENCOUNTER — Ambulatory Visit: Payer: Self-pay | Admitting: Pulmonary Disease

## 2023-10-24 ENCOUNTER — Telehealth: Payer: Self-pay | Admitting: Podiatry

## 2023-10-24 NOTE — Telephone Encounter (Signed)
 Patient has been rescheduled as well as had all upcoming laser appointments scheduled.

## 2023-10-24 NOTE — Telephone Encounter (Signed)
 Patient is requesting, Crystal Lloyd to contact her regarding rescheduling nurse visit, Laser #4  Patient contact telephone number, [phone]

## 2023-10-26 ENCOUNTER — Ambulatory Visit: Attending: Internal Medicine | Admitting: Internal Medicine

## 2023-10-26 ENCOUNTER — Encounter: Payer: Self-pay | Admitting: Internal Medicine

## 2023-10-26 VITALS — BP 118/62 | HR 61 | Ht 59.5 in | Wt 107.4 lb

## 2023-10-26 DIAGNOSIS — I214 Non-ST elevation (NSTEMI) myocardial infarction: Secondary | ICD-10-CM | POA: Diagnosis not present

## 2023-10-26 DIAGNOSIS — R1310 Dysphagia, unspecified: Secondary | ICD-10-CM | POA: Diagnosis not present

## 2023-10-26 DIAGNOSIS — I340 Nonrheumatic mitral (valve) insufficiency: Secondary | ICD-10-CM

## 2023-10-26 DIAGNOSIS — E782 Mixed hyperlipidemia: Secondary | ICD-10-CM

## 2023-10-26 DIAGNOSIS — R55 Syncope and collapse: Secondary | ICD-10-CM

## 2023-10-26 DIAGNOSIS — Z789 Other specified health status: Secondary | ICD-10-CM

## 2023-10-26 DIAGNOSIS — I5181 Takotsubo syndrome: Secondary | ICD-10-CM

## 2023-10-26 NOTE — Patient Instructions (Signed)
 Medication Instructions:   Your physician recommends that you continue on your current medications as directed. Please refer to the Current Medication list given to you today.  *If you need a refill on your cardiac medications before your next appointment, please call your pharmacy*    Lab Work: NONE ORDERED  TODAY    If you have labs (blood work) drawn today and your tests are completely normal, you will receive your results only by: MyChart Message (if you have MyChart) OR A paper copy in the mail If you have any lab test that is abnormal or we need to change your treatment, we will call you to review the results.    Testing/Procedures:  Your physician has requested that you have an echocardiogram. Echocardiography is a painless test that uses sound waves to create images of your heart. It provides your doctor with information about the size and shape of your heart and how well your heart's chambers and valves are working. This procedure takes approximately one hour. There are no restrictions for this procedure. Please do NOT wear cologne, perfume, aftershave, or lotions (deodorant is allowed). Please arrive 15 minutes prior to your appointment time.  Please note: We ask at that you not bring children with you during ultrasound (echo/ vascular) testing. Due to room size and safety concerns, children are not allowed in the ultrasound rooms during exams. Our front office staff cannot provide observation of children in our lobby area while testing is being conducted. An adult accompanying a patient to their appointment will only be allowed in the ultrasound room at the discretion of the ultrasound technician under special circumstances. We apologize for any inconvenience.      Follow-Up: At [facility], you and your health needs are our priority.  As part of our continuing mission to provide you with exceptional heart care, our providers are all part of one team.  This team  includes your primary Cardiologist (physician) and Advanced Practice Providers or APPs (Physician Assistants and Nurse Practitioners) who all work together to provide you with the care you need, when you need it.  Your next appointment:    1 year(s)  Provider:   Dr. Loni      We recommend signing up for the patient portal called MyChart.  Sign up information is provided on this After Visit Summary.  MyChart is used to connect with patients for Virtual Visits (Telemedicine).  Patients are able to view lab/test results, encounter notes, upcoming appointments, etc.  Non-urgent messages can be sent to your provider as well.   To learn more about what you can do with MyChart, go to [email or web address].   Other Instructions

## 2023-10-26 NOTE — Progress Notes (Signed)
 Cardiology Office Note:  .   Date:  10/26/2023  ID:  Crystal Lloyd, DOB 1945-10-24, MRN [id number] PCP: Nichole Senior, MD  [facility] Providers Cardiologist:  Candyce Reek, MD Electrophysiologist:  Fonda Kitty, MD    History of Present Illness: Crystal Lloyd   Crystal Lloyd is a 78 y.o. female. Retired Marine scientist.  Discussed the use of AI scribe software for clinical note transcription with the patient, who gave verbal consent to proceed.  History of Present Illness Dr. Rollene LITTIE Lloyd is a 78 year old female with nonobstructive coronary artery disease who presents for follow-up.  She recently had a follow-up with electrophysiology due to three-second pauses on telemetry while awake and eating, identified as swallow syncope. Lifestyle modifications, including avoiding specific foods, taking smaller mouthfuls, chewing adequately, and maintaining adequate fluid intake, have prevented significant events.  Her hyperlipidemia and secondary prevention of CAD is well controlled with Bempedoic acid, ezetimibe, and Vascepa  due to statin intolerance. A recent lipid panel showed an LDL of 100, and her triglycerides are at her lowest.     ROS: negative except per HPI above.  Studies Reviewed: Crystal Lloyd   EKG Interpretation Date/Time:  Thursday October 26 2023 10:19:03 EDT Ventricular Rate:  60 PR Interval:  144 QRS Duration:  84 QT Interval:  410 QTC Calculation: 410 R Axis:   74  Text Interpretation: Normal sinus rhythm with sinus arrhythmia Nonspecific ST and T wave abnormality When compared with ECG of 22-Nov-2022 11:40, Nonspecific T wave abnormality, improved in Anterior leads Confirmed by Crystal Lloyd ([id number]) on 10/26/2023 10:49:06 AM    Results LABS Lipid Panel: LDL 100 (12/2022)  RADIOLOGY Chest CT: Borderline pulmonary artery dilation, no aortic root ectasia  DIAGNOSTIC EKG: Mild nonspecific abnormality, unchanged Echocardiogram: No signs of pulmonary  hypertension, mild mitral valve regurgitation Risk Assessment/Calculations:       Physical Exam:   VS:  BP 118/62   Pulse 61   Ht 4' 11.5 (1.511 m)   Wt 107 lb 6.4 oz (48.7 kg)   LMP 04/24/1997   SpO2 98%   BMI 21.33 kg/m    Wt Readings from Last 3 Encounters:  10/26/23 107 lb 6.4 oz (48.7 kg)  09/13/23 114 lb (51.7 kg)  05/01/23 120 lb (54.4 kg)     Physical Exam GENERAL: Alert, cooperative, well developed, no acute distress. HEENT: Normocephalic, normal oropharynx, moist mucous membranes. CHEST: Clear to auscultation bilaterally, no wheezes, rhonchi, or crackles. CARDIOVASCULAR: Normal heart rate and rhythm, S1 and S2 normal without murmurs. ABDOMEN: Soft, non-tender, non-distended, without organomegaly, normal bowel sounds. EXTREMITIES: No cyanosis or edema. NEUROLOGICAL: Cranial nerves grossly intact, moves all extremities without gross motor or sensory deficit.   ASSESSMENT AND PLAN: .    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 Symptoms improved with lifestyle modifications. No pacemaker required currently. Barium swallow showed no significant issues. Endoscopy suggested but not scheduled. - Continue lifestyle modifications to avoid exacerbating factors. - Ensure adequate fluid intake. - Follow up with GI for further evaluation and consider scheduling endoscopy.  Hyperlipidemia with statin intolerance Hyperlipidemia controlled on Bempedoic acid, ezetimibe, and Vascepa . Previous statin intolerance due to elevated CK and chronic fatigue syndrome. Current regimen effective with no significant side effects. Recent lipid panel showed LDL of 100, slightly higher but acceptable. - Continue current regimen of Bempedoic acid-ezetimibe 180 mg -10 mg, and  Vascepa  3 grams daily. - Monitor lipid levels with  upcoming labs in December with Doctor [place].  Nonobstructive coronary artery disease Nonobstructive coronary artery disease. Recent CT scan raised concerns about pulmonary artery and aortic root ectasia, not confirmed on personal review. No signs of pulmonary hypertension on last echocardiogram. - Continue current regimen of Bempedoic acid-ezetimibe 180 mg -10 mg, and Vascepa  3 grams daily.  Mitral valve regurgitation Mild mitral valve regurgitation with no significant progression. Recent CT scan raised concerns about pulmonary artery and aortic root ectasia, not confirmed. No signs of pulmonary hypertension on last echocardiogram. - Order repeat echocardiogram to assess mitral valve regurgitation and pulmonary pressures.  Recording duration: 22 minutes      Crystal Dehaan, MD, FACC

## 2023-10-31 ENCOUNTER — Ambulatory Visit

## 2023-11-07 ENCOUNTER — Encounter

## 2023-11-10 ENCOUNTER — Ambulatory Visit (INDEPENDENT_AMBULATORY_CARE_PROVIDER_SITE_OTHER): Admitting: Podiatry

## 2023-11-10 DIAGNOSIS — B351 Tinea unguium: Secondary | ICD-10-CM

## 2023-11-10 NOTE — Progress Notes (Unsigned)
 Patient presents today for the 4th laser treatment. Diagnosed with mycotic nail infection by Dr. Sikora.   Toenail most affected 1st bilateral and 2-5 right foot.  All other systems are negative.  Nails were filed thin. Laser therapy was administered to 1-5 toenails bilaterally and patient tolerated the treatment well. All safety precautions were in place.   Post treatment instructions reviewed and provided to patient. Patient had no questions regarding plan of care.   Follow up in 6 weeks for laser # 5.

## 2023-11-22 ENCOUNTER — Telehealth (HOSPITAL_BASED_OUTPATIENT_CLINIC_OR_DEPARTMENT_OTHER): Payer: Self-pay | Admitting: *Deleted

## 2023-11-22 NOTE — Telephone Encounter (Signed)
 Patient inquiring about scheduling CT scan for early Dec 2025. She would like to have done at [facility].

## 2023-11-22 NOTE — Telephone Encounter (Signed)
 Called pt and gave her central scheduling's number to book appt. NFN

## 2023-11-27 ENCOUNTER — Encounter

## 2023-12-04 ENCOUNTER — Ambulatory Visit (HOSPITAL_COMMUNITY)
Admission: RE | Admit: 2023-12-04 | Discharge: 2023-12-04 | Disposition: A | Discharge status: Home / Self Care | Source: Ambulatory Visit | Attending: Internal Medicine | Admitting: Internal Medicine

## 2023-12-04 ENCOUNTER — Ambulatory Visit: Payer: Self-pay | Admitting: Internal Medicine

## 2023-12-04 DIAGNOSIS — I340 Nonrheumatic mitral (valve) insufficiency: Secondary | ICD-10-CM | POA: Diagnosis not present

## 2023-12-04 LAB — ECHOCARDIOGRAM COMPLETE
AR max vel: 2.26 cm2
AV Area VTI: 2.23 cm2
AV Area mean vel: 2.18 cm2
AV Mean grad: 2 mmHg
AV Peak grad: 3.7 mmHg
Ao pk vel: 0.96 m/s
Area-P 1/2: 3.21 cm2
MV M vel: 3.73 m/s
MV Peak grad: 55.7 mmHg
Radius: 0.76 cm
S' Lateral: 2.54 cm

## 2023-12-11 ENCOUNTER — Telehealth: Payer: Self-pay | Admitting: Pharmacy Technician

## 2023-12-11 ENCOUNTER — Other Ambulatory Visit (HOSPITAL_COMMUNITY): Payer: Self-pay

## 2023-12-11 ENCOUNTER — Ambulatory Visit (HOSPITAL_BASED_OUTPATIENT_CLINIC_OR_DEPARTMENT_OTHER)
Admission: RE | Admit: 2023-12-11 | Discharge: 2023-12-11 | Disposition: A | Discharge status: Home / Self Care | Source: Ambulatory Visit | Attending: Pulmonary Disease | Admitting: Pulmonary Disease

## 2023-12-11 DIAGNOSIS — R918 Other nonspecific abnormal finding of lung field: Secondary | ICD-10-CM | POA: Diagnosis not present

## 2023-12-11 DIAGNOSIS — J479 Bronchiectasis, uncomplicated: Secondary | ICD-10-CM | POA: Insufficient documentation

## 2023-12-11 NOTE — Telephone Encounter (Signed)
 Pharmacy Patient Advocate Encounter   Received notification from Fax that prior authorization for nexlizet  is required/requested.   Insurance verification completed.   The patient is insured through [facility].   Per test claim: PA required; PA submitted to above mentioned insurance via Latent Key/confirmation #/EOC [id number] Status is pending

## 2023-12-12 ENCOUNTER — Ambulatory Visit (HOSPITAL_BASED_OUTPATIENT_CLINIC_OR_DEPARTMENT_OTHER)

## 2023-12-12 ENCOUNTER — Other Ambulatory Visit (HOSPITAL_COMMUNITY): Payer: Self-pay

## 2023-12-12 DIAGNOSIS — J479 Bronchiectasis, uncomplicated: Secondary | ICD-10-CM

## 2023-12-12 DIAGNOSIS — R918 Other nonspecific abnormal finding of lung field: Secondary | ICD-10-CM

## 2023-12-12 LAB — PULMONARY FUNCTION TEST
DL/VA % pred: 106 %
DL/VA: 4.49 ml/min/mmHg/L
DLCO unc % pred: 92 %
DLCO unc: 15.32 ml/min/mmHg
FEF 25-75 Post: 2.15 L/s
FEF 25-75 Pre: 1.57 L/s
FEF2575-%Change-Post: 36 %
FEF2575-%Pred-Post: 154 %
FEF2575-%Pred-Pre: 113 %
FEV1-%Change-Post: 7 %
FEV1-%Pred-Post: 116 %
FEV1-%Pred-Pre: 107 %
FEV1-Post: 1.99 L
FEV1-Pre: 1.84 L
FEV1FVC-%Change-Post: 1 %
FEV1FVC-%Pred-Pre: 105 %
FEV6-%Change-Post: 6 %
FEV6-%Pred-Post: 114 %
FEV6-%Pred-Pre: 107 %
FEV6-Post: 2.49 L
FEV6-Pre: 2.35 L
FEV6FVC-%Pred-Post: 105 %
FEV6FVC-%Pred-Pre: 105 %
FVC-%Change-Post: 6 %
FVC-%Pred-Post: 108 %
FVC-%Pred-Pre: 102 %
FVC-Post: 2.49 L
FVC-Pre: 2.35 L
Post FEV1/FVC ratio: 80 %
Post FEV6/FVC ratio: 100 %
Pre FEV1/FVC ratio: 79 %
Pre FEV6/FVC Ratio: 100 %
RV % pred: 101 %
RV: 2.14 L
TLC % pred: 100 %
TLC: 4.46 L

## 2023-12-12 NOTE — Progress Notes (Signed)
 Full PFT performed today.

## 2023-12-12 NOTE — Telephone Encounter (Signed)
 Pharmacy Patient Advocate Encounter  Received notification from [facility] that Prior Authorization for nexlizet  has been APPROVED from 12/11/23 to 12/10/24. Ran test claim, Copay is $0.00- one month. This test claim was processed through [facility]- copay amounts may vary at other pharmacies due to pharmacy/plan contracts, or as the patient moves through the different stages of their insurance plan.   PA #/Case ID/Reference #: [id number]

## 2023-12-12 NOTE — Patient Instructions (Signed)
 Full PFT performed today.

## 2023-12-25 ENCOUNTER — Encounter: Payer: Self-pay | Admitting: Pulmonary Disease

## 2023-12-25 NOTE — Telephone Encounter (Signed)
 Dr. Kara, Please see message regarding upcoming appointment and advise.  Thank you.

## 2023-12-25 NOTE — Telephone Encounter (Signed)
 Please let patient know that I have reviewed her CT Chest scan. It is very reassuring overall as the radiology report describes.  Given her breathing remains stable and PFT results are stable, she does not need to come in for follow up on 12/11 as scheduled. Recommend she follow up in August 2026 which will be a 1 year follow up at that time.  We wish her happy holidays.  JD

## 2023-12-27 DIAGNOSIS — K080 Exfoliation of teeth due to systemic causes: Secondary | ICD-10-CM | POA: Diagnosis not present

## 2023-12-27 DIAGNOSIS — M79675 Pain in left toe(s): Secondary | ICD-10-CM | POA: Diagnosis not present

## 2023-12-29 ENCOUNTER — Ambulatory Visit (INDEPENDENT_AMBULATORY_CARE_PROVIDER_SITE_OTHER)

## 2023-12-29 NOTE — Progress Notes (Signed)
  Patient presents today for the 5th laser treatment. Diagnosed with mycotic nail infection by Dr. Sikora.    Toenail most affected 1st bilateral and 2-5 right foot. Right foot nails 1-5 were thickened with a yellow discoloration. Left foot nails 1-4 had a mild white discoloration on the tip of the nail bed..   All other systems are negative.   Nails were filed thin. Laser therapy was administered to 1-5 toenails bilaterally and patient tolerated the treatment well. Patient tolerated the treatment well. All safety precautions were in place.       Follow up in 6 weeks for laser # 6.

## 2024-01-01 ENCOUNTER — Ambulatory Visit (HOSPITAL_BASED_OUTPATIENT_CLINIC_OR_DEPARTMENT_OTHER): Admitting: Obstetrics & Gynecology

## 2024-01-01 DIAGNOSIS — M8580 Other specified disorders of bone density and structure, unspecified site: Secondary | ICD-10-CM | POA: Diagnosis not present

## 2024-01-04 ENCOUNTER — Ambulatory Visit: Admitting: Pulmonary Disease

## 2024-01-08 DIAGNOSIS — R82998 Other abnormal findings in urine: Secondary | ICD-10-CM | POA: Diagnosis not present

## 2024-01-10 ENCOUNTER — Encounter (HOSPITAL_BASED_OUTPATIENT_CLINIC_OR_DEPARTMENT_OTHER): Payer: Self-pay

## 2024-01-16 ENCOUNTER — Ambulatory Visit (HOSPITAL_BASED_OUTPATIENT_CLINIC_OR_DEPARTMENT_OTHER): Payer: Self-pay | Admitting: Obstetrics & Gynecology

## 2024-02-23 ENCOUNTER — Ambulatory Visit (INDEPENDENT_AMBULATORY_CARE_PROVIDER_SITE_OTHER): Payer: Self-pay

## 2024-02-23 DIAGNOSIS — B351 Tinea unguium: Secondary | ICD-10-CM

## 2024-02-23 NOTE — Patient Instructions (Signed)

## 2024-02-23 NOTE — Progress Notes (Signed)
 Patient presents today for the 6th laser treatment. Diagnosed with mycotic nail infection by Dr. Sikora.   Toenails most affected 1st bilaterally and 2-5 right foot.   All other systems are negative.  Nails were filed thin. Laser therapy was administered to 1-5 toenails bilaterally and patient tolerated the treatment well. All safety precautions were in place.   Post treatment instructions reviewed and provided to patient. Patient has completed 6th laser treatment and would like to schedule a 7th session. She has an upcoming appointment in March 2026 with provider to discuss further treatment.  Follow up in 8 weeks for laser # 7

## 2024-02-27 ENCOUNTER — Ambulatory Visit: Admitting: Podiatry

## 2024-03-01 ENCOUNTER — Ambulatory Visit (HOSPITAL_BASED_OUTPATIENT_CLINIC_OR_DEPARTMENT_OTHER): Admitting: Obstetrics & Gynecology

## 2024-04-02 ENCOUNTER — Ambulatory Visit (HOSPITAL_BASED_OUTPATIENT_CLINIC_OR_DEPARTMENT_OTHER): Admitting: Obstetrics & Gynecology

## 2024-04-03 ENCOUNTER — Ambulatory Visit: Admitting: Podiatry

## 2024-04-19 ENCOUNTER — Ambulatory Visit
